# Patient Record
Sex: Male | Born: 1960 | Race: Black or African American | Hispanic: No | Marital: Married | Smoking: Current every day smoker
Health system: Southern US, Community
[De-identification: ages and names within clinical notes are randomized; demographics above are authoritative.]

## PROBLEM LIST (undated history)

## (undated) DIAGNOSIS — F250 Schizoaffective disorder, bipolar type: Secondary | ICD-10-CM

## (undated) DIAGNOSIS — I10 Essential (primary) hypertension: Secondary | ICD-10-CM

## (undated) DIAGNOSIS — H409 Unspecified glaucoma: Secondary | ICD-10-CM

## (undated) DIAGNOSIS — C61 Malignant neoplasm of prostate: Secondary | ICD-10-CM

## (undated) DIAGNOSIS — F17200 Nicotine dependence, unspecified, uncomplicated: Secondary | ICD-10-CM

## (undated) DIAGNOSIS — F419 Anxiety disorder, unspecified: Secondary | ICD-10-CM

## (undated) DIAGNOSIS — F259 Schizoaffective disorder, unspecified: Secondary | ICD-10-CM

## (undated) HISTORY — DX: Nicotine dependence, unspecified, uncomplicated: F17.200

## (undated) HISTORY — DX: Essential (primary) hypertension: I10

## (undated) HISTORY — DX: Schizoaffective disorder, bipolar type: F25.0

## (undated) HISTORY — DX: Unspecified glaucoma: H40.9

## (undated) HISTORY — PX: TONSILLECTOMY: SUR1361

## (undated) HISTORY — DX: Anxiety disorder, unspecified: F41.9

---

## 2002-10-01 ENCOUNTER — Inpatient Hospital Stay (HOSPITAL_COMMUNITY): Admission: EM | Admit: 2002-10-01 | Discharge: 2002-10-09 | Payer: Self-pay | Admitting: Psychiatry

## 2003-08-07 ENCOUNTER — Other Ambulatory Visit: Payer: Self-pay

## 2009-02-12 ENCOUNTER — Inpatient Hospital Stay: Payer: Self-pay | Admitting: Psychiatry

## 2009-11-07 ENCOUNTER — Emergency Department: Payer: Self-pay | Admitting: Emergency Medicine

## 2010-10-05 ENCOUNTER — Emergency Department: Payer: Self-pay | Admitting: Internal Medicine

## 2014-02-18 ENCOUNTER — Emergency Department: Payer: Self-pay | Admitting: Emergency Medicine

## 2014-02-18 LAB — COMPREHENSIVE METABOLIC PANEL
Albumin: 4.1 g/dL (ref 3.4–5.0)
Alkaline Phosphatase: 62 U/L
Anion Gap: 3 — ABNORMAL LOW (ref 7–16)
BUN: 25 mg/dL — ABNORMAL HIGH (ref 7–18)
Bilirubin,Total: 0.6 mg/dL (ref 0.2–1.0)
CALCIUM: 9 mg/dL (ref 8.5–10.1)
CREATININE: 1.28 mg/dL (ref 0.60–1.30)
Chloride: 106 mmol/L (ref 98–107)
Co2: 29 mmol/L (ref 21–32)
Glucose: 63 mg/dL — ABNORMAL LOW (ref 65–99)
Osmolality: 278 (ref 275–301)
POTASSIUM: 3.8 mmol/L (ref 3.5–5.1)
SGOT(AST): 32 U/L (ref 15–37)
SGPT (ALT): 27 U/L
Sodium: 138 mmol/L (ref 136–145)
Total Protein: 7.5 g/dL (ref 6.4–8.2)

## 2014-02-18 LAB — DRUG SCREEN, URINE
AMPHETAMINES, UR SCREEN: NEGATIVE (ref ?–1000)
Barbiturates, Ur Screen: NEGATIVE (ref ?–200)
Benzodiazepine, Ur Scrn: NEGATIVE (ref ?–200)
CANNABINOID 50 NG, UR ~~LOC~~: NEGATIVE (ref ?–50)
COCAINE METABOLITE, UR ~~LOC~~: NEGATIVE (ref ?–300)
MDMA (Ecstasy)Ur Screen: NEGATIVE (ref ?–500)
Methadone, Ur Screen: NEGATIVE (ref ?–300)
Opiate, Ur Screen: NEGATIVE (ref ?–300)
Phencyclidine (PCP) Ur S: NEGATIVE (ref ?–25)
TRICYCLIC, UR SCREEN: NEGATIVE (ref ?–1000)

## 2014-02-18 LAB — URINALYSIS, COMPLETE
BACTERIA: NONE SEEN
Bilirubin,UR: NEGATIVE
Glucose,UR: NEGATIVE mg/dL (ref 0–75)
Ketone: NEGATIVE
LEUKOCYTE ESTERASE: NEGATIVE
Nitrite: NEGATIVE
PH: 6 (ref 4.5–8.0)
Protein: NEGATIVE
Specific Gravity: 1.024 (ref 1.003–1.030)
WBC UR: <1 /HPF (ref 0–5)

## 2014-02-18 LAB — CBC
HCT: 43.7 % (ref 40.0–52.0)
HGB: 14.2 g/dL (ref 13.0–18.0)
MCH: 31 pg (ref 26.0–34.0)
MCHC: 32.6 g/dL (ref 32.0–36.0)
MCV: 95 fL (ref 80–100)
Platelet: 208 10*3/uL (ref 150–440)
RBC: 4.59 10*6/uL (ref 4.40–5.90)
RDW: 13.2 % (ref 11.5–14.5)
WBC: 8.3 10*3/uL (ref 3.8–10.6)

## 2014-02-18 LAB — ETHANOL: Ethanol: <3 mg/dL

## 2014-02-18 LAB — SALICYLATE LEVEL: Salicylates, Serum: 3.3 mg/dL — ABNORMAL HIGH

## 2014-02-18 LAB — ACETAMINOPHEN LEVEL

## 2014-09-28 NOTE — Consult Note (Signed)
PATIENT NAME:  Ian Cook, Ian Cook MR#:  [id number] DATE OF BIRTH:  30-Mar-1961  DATE OF CONSULTATION:  02/18/2014  REFERRING PHYSICIAN:   CONSULTING PHYSICIAN:  Gonzella Lex, MD  IDENTIFYING INFORMATION AND REASON FOR CONSULT: A 54 year old man with schizophrenia who came to the hospital voluntarily.   CHIEF COMPLAINT: "I just need my meds."   HISTORY OF PRESENT ILLNESS: Information obtained from the patient and the chart. The patient tells me that he has been out of his prescription antipsychotic for about a month. Based on the refills I have on file in my office he has probably been off it for more like several months. He is hesitant to give me a complete history as to what symptoms he is having with it. He does admit that he is having auditory hallucinations, that he has been feeling like his thoughts are racing, that he has been feeling anxious and more jittery. He is not sleeping as well as he used to and his appetite is worse. He denies suicidal or homicidal ideation. Denies that he has been aggressive. Denies that he is abusing any medicines. Apparently the reason he stopped coming for followup appointments that somehow his Medicare became invalid. I have no idea how this happened, but this clearly chronically disabled gentleman certainly needs to have some kind of coverage. He does not report any particular new stresses or changes in his environment or situation.   PAST PSYCHIATRIC HISTORY: Long history of schizophrenia. He was last in the hospital about 4-5 years ago. He does not have a clear history of suicide attempts. Decompensates with hallucinations and paranoia and agitation at times. He has had multiple medication trials in the past,  Zyprexa 30 mg at night seemed to be working as well or better than anything.   SOCIAL HISTORY: Lives with his nephew. That is his closest relative and has been a stable situation for quite a while.   PAST MEDICAL HISTORY: He looks pretty thin and he admits  he has been losing weight, but he does not have any physical complaints. Does not have any other known specific medical problems chronically.   SUBSTANCE ABUSE HISTORY: Denies that he is using alcohol or drugs. Does not have a known past history of substance abuse that I can identify.   REVIEW OF SYSTEMS: Admits to auditory hallucinations, but he says they do not bother him too much. No suicidal or homicidal ideation. No visual hallucinations. Appetite has not been as good. No pain.  The rest of his full physical review of systems is negative.   MENTAL STATUS EXAMINATION: A somewhat disheveled, underweight gentleman who looks his stated age, cooperative with the interview. Eye contact good. Psychomotor activity a little fidgety. Speech quiet, decreased in amount, some thought blocking. Thoughts are disorganized. Veers off topic easily. Did not make any obviously bizarre statements, did not make any threatening statements. Denies suicidal or homicidal ideation. Denies visual hallucinations. Positive auditory hallucinations. He is alert and oriented x 3. Could remember 3 out of 3 objects immediately and at 3 minutes.   LABORATORY RESULTS: Drug screen is all negative. Chemistry shows a low glucose at 63, BUN elevated at 25. The rest of the chemistry panel unremarkable. Alcohol level negative. His CBC is all normal. His urinalysis is also unremarkable.   VITAL SIGNS: His most recent blood pressure is 143/102, respirations 18, pulse 74, temperature 98.8.   ASSESSMENT: A 54 year old man with schizophrenia who presented to the hospital simply requesting a refill of his medicines.  Mild to moderate exacerbation of his psychotic symptoms. No sign of dangerousness. The patient does not require inpatient hospitalization. He needs to be continuing to get outpatient followup care. He was seeing me in my office and has not been there for about a year. Apparently because his Medicare somehow lapsed.   TREATMENT PLAN:  I have put in a prescription for Zyprexa 15 mg strength 2 p.o. at bedtime pills with a refill to the [facility]. Educated the patient about this. Told him if that is not a cheap enough pharmacy he can call around to other drug stores and see if any place else can get it for him less expensive. He or his nephew need to find a way to get his Medicare reinstated so they can come back and see me in the office, otherwise should go to [facility] or [place] for outpatient psychiatric followup.   DIAGNOSIS, PRINCIPAL AND PRIMARY:   AXIS I: Schizophrenia.   SECONDARY DIAGNOSES:  AXIS I: No further.   AXIS II: No diagnosis.     ____________________________ Gonzella Lex, MD jtc:bu D: 02/18/2014 18:34:00 ET T: 02/18/2014 18:48:34 ET JOB#: [id number]  cc: Gonzella Lex, MD, <Dictator> Gonzella Lex MD ELECTRONICALLY SIGNED 02/28/2014 0:09

## 2014-09-28 NOTE — Consult Note (Signed)
Brief Consult Note: Diagnosis: schizophrenia.   Patient was seen by consultant.   Consult note dictated.   Orders entered.   Discussed with Attending MD.   Comments: Psychiatry: Patient with schizophrenia presented to ER wanting a med refill. Refill done of zyprexa 30mg  qhs at [facility]. Patient can be discharged and urged to make follow up appointment.  Electronic Signatures: Clapacs, Madie Reno (MD)  (Signed 14-Sep-15 18:28)  Authored: Brief Consult Note   Last Updated: 14-Sep-15 18:28 by Gonzella Lex (MD)

## 2014-10-29 ENCOUNTER — Encounter: Payer: Self-pay | Admitting: Emergency Medicine

## 2014-10-29 ENCOUNTER — Emergency Department
Admission: EM | Admit: 2014-10-29 | Discharge: 2014-10-30 | Disposition: A | Discharge status: Home / Self Care | Payer: Medicare Other | Attending: Student | Admitting: Student

## 2014-10-29 DIAGNOSIS — F259 Schizoaffective disorder, unspecified: Secondary | ICD-10-CM | POA: Diagnosis not present

## 2014-10-29 DIAGNOSIS — I10 Essential (primary) hypertension: Secondary | ICD-10-CM | POA: Insufficient documentation

## 2014-10-29 DIAGNOSIS — Z79899 Other long term (current) drug therapy: Secondary | ICD-10-CM | POA: Diagnosis not present

## 2014-10-29 DIAGNOSIS — F309 Manic episode, unspecified: Secondary | ICD-10-CM | POA: Diagnosis present

## 2014-10-29 DIAGNOSIS — Z720 Tobacco use: Secondary | ICD-10-CM | POA: Diagnosis not present

## 2014-10-29 HISTORY — DX: Schizoaffective disorder, bipolar type: F25.0

## 2014-10-29 HISTORY — DX: Schizoaffective disorder, unspecified: F25.9

## 2014-10-29 HISTORY — DX: Essential (primary) hypertension: I10

## 2014-10-29 LAB — CBC WITH DIFFERENTIAL/PLATELET
Basophils Absolute: 0 10*3/uL (ref 0–0.1)
Basophils Relative: 1 %
EOS PCT: 2 %
Eosinophils Absolute: 0.1 10*3/uL (ref 0–0.7)
HEMATOCRIT: 41.2 % (ref 40.0–52.0)
Hemoglobin: 13.8 g/dL (ref 13.0–18.0)
LYMPHS ABS: 2 10*3/uL (ref 1.0–3.6)
LYMPHS PCT: 30 %
MCH: 31.6 pg (ref 26.0–34.0)
MCHC: 33.5 g/dL (ref 32.0–36.0)
MCV: 94.2 fL (ref 80.0–100.0)
MONO ABS: 0.5 10*3/uL (ref 0.2–1.0)
Monocytes Relative: 7 %
Neutro Abs: 4.2 10*3/uL (ref 1.4–6.5)
Neutrophils Relative %: 60 %
Platelets: 183 10*3/uL (ref 150–440)
RBC: 4.37 MIL/uL — ABNORMAL LOW (ref 4.40–5.90)
RDW: 13.4 % (ref 11.5–14.5)
WBC: 6.8 10*3/uL (ref 3.8–10.6)

## 2014-10-29 LAB — URINALYSIS COMPLETE WITH MICROSCOPIC (ARMC ONLY)
BACTERIA UA: NONE SEEN
BILIRUBIN URINE: NEGATIVE
Glucose, UA: NEGATIVE mg/dL
KETONES UR: NEGATIVE mg/dL
Leukocytes, UA: NEGATIVE
Nitrite: NEGATIVE
PH: 6 (ref 5.0–8.0)
PROTEIN: NEGATIVE mg/dL
SPECIFIC GRAVITY, URINE: 1.01 (ref 1.005–1.030)
Squamous Epithelial / LPF: NONE SEEN
WBC UA: NONE SEEN WBC/hpf (ref 0–5)

## 2014-10-29 LAB — COMPREHENSIVE METABOLIC PANEL
ALBUMIN: 4.5 g/dL (ref 3.5–5.0)
ALT: 18 U/L (ref 17–63)
AST: 29 U/L (ref 15–41)
Alkaline Phosphatase: 59 U/L (ref 38–126)
Anion gap: 6 (ref 5–15)
BILIRUBIN TOTAL: 0.5 mg/dL (ref 0.3–1.2)
BUN: 27 mg/dL — ABNORMAL HIGH (ref 6–20)
CALCIUM: 8.4 mg/dL — AB (ref 8.9–10.3)
CO2: 29 mmol/L (ref 22–32)
Chloride: 101 mmol/L (ref 101–111)
Creatinine, Ser: 1.42 mg/dL — ABNORMAL HIGH (ref 0.61–1.24)
GFR calc non Af Amer: 54 mL/min — ABNORMAL LOW (ref >60)
Glucose, Bld: 67 mg/dL (ref 65–99)
Potassium: 3.9 mmol/L (ref 3.5–5.1)
Sodium: 136 mmol/L (ref 135–145)
TOTAL PROTEIN: 7.1 g/dL (ref 6.5–8.1)

## 2014-10-29 LAB — URINE DRUG SCREEN, QUALITATIVE (ARMC ONLY)
Amphetamines, Ur Screen: NOT DETECTED
BARBITURATES, UR SCREEN: NOT DETECTED
BENZODIAZEPINE, UR SCRN: NOT DETECTED
Cannabinoid 50 Ng, Ur ~~LOC~~: NOT DETECTED
Cocaine Metabolite,Ur ~~LOC~~: NOT DETECTED
MDMA (ECSTASY) UR SCREEN: NOT DETECTED
Methadone Scn, Ur: NOT DETECTED
OPIATE, UR SCREEN: NOT DETECTED
Phencyclidine (PCP) Ur S: NOT DETECTED
Tricyclic, Ur Screen: NOT DETECTED

## 2014-10-29 LAB — ETHANOL: Alcohol, Ethyl (B): <5 mg/dL (ref <5)

## 2014-10-29 MED ORDER — LURASIDONE HCL 40 MG PO TABS
40.0000 mg | ORAL_TABLET | Freq: Every day | ORAL | Status: DC
  Administered 2014-10-30: 40 mg via ORAL
  Filled 2014-10-29: qty 1

## 2014-10-29 NOTE — ED Notes (Signed)
Brought in by group home personnel for beh med eval. ..may need possible medication adjustment

## 2014-10-29 NOTE — ED Notes (Signed)
Patient assigned to appropriate care area. Patient oriented to unit/care area: Informed that, for their safety, care areas are designed for safety and monitored by security cameras at all times; and visiting hours explained to patient. Patient verbalizes understanding, and verbal contract for safety obtained. 

## 2014-10-29 NOTE — ED Notes (Signed)
BEHAVIORAL HEALTH ROUNDING Patient sleeping: No. Patient alert and oriented: yes Behavior appropriate: Yes.  ; If no, describe: n/a Nutrition and fluids offered: Yes- pt given dinner tray. Toileting and hygiene offered: No Sitter present: no Event organiser present: Yes

## 2014-10-29 NOTE — ED Notes (Signed)
BEHAVIORAL HEALTH ROUNDING Patient sleeping: Yes.   Patient alert and oriented: not applicable Behavior appropriate: Yes.  ; If no, describe:  Nutrition and fluids offered: No Toileting and hygiene offered: No Sitter present: yes Law enforcement present: Yes   
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
## 2014-10-29 NOTE — ED Notes (Signed)
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
## 2014-10-29 NOTE — ED Notes (Signed)
RISHAAN GUNNER (POA) [phone] [facility].

## 2014-10-29 NOTE — ED Notes (Signed)

## 2014-10-29 NOTE — ED Notes (Signed)
Herewith group home rep, states pt is not supposed to leave the facility but keeps doing it.  Left this am and did not get meds.

## 2014-10-30 ENCOUNTER — Inpatient Hospital Stay
Admission: EM | Admit: 2014-10-30 | Discharge: 2014-11-06 | DRG: 885 | Disposition: A | Discharge status: Home / Self Care | Payer: Medicare Other | Source: Intra-hospital | Attending: Psychiatry | Admitting: Psychiatry

## 2014-10-30 DIAGNOSIS — F209 Schizophrenia, unspecified: Secondary | ICD-10-CM | POA: Diagnosis present

## 2014-10-30 DIAGNOSIS — F250 Schizoaffective disorder, bipolar type: Secondary | ICD-10-CM | POA: Diagnosis present

## 2014-10-30 DIAGNOSIS — Z79899 Other long term (current) drug therapy: Secondary | ICD-10-CM | POA: Diagnosis not present

## 2014-10-30 DIAGNOSIS — F28 Other psychotic disorder not due to a substance or known physiological condition: Secondary | ICD-10-CM | POA: Diagnosis present

## 2014-10-30 DIAGNOSIS — Z8249 Family history of ischemic heart disease and other diseases of the circulatory system: Secondary | ICD-10-CM

## 2014-10-30 DIAGNOSIS — N179 Acute kidney failure, unspecified: Secondary | ICD-10-CM | POA: Diagnosis present

## 2014-10-30 DIAGNOSIS — F17213 Nicotine dependence, cigarettes, with withdrawal: Secondary | ICD-10-CM | POA: Diagnosis present

## 2014-10-30 DIAGNOSIS — I10 Essential (primary) hypertension: Secondary | ICD-10-CM | POA: Diagnosis present

## 2014-10-30 DIAGNOSIS — F259 Schizoaffective disorder, unspecified: Secondary | ICD-10-CM | POA: Diagnosis not present

## 2014-10-30 DIAGNOSIS — G4700 Insomnia, unspecified: Secondary | ICD-10-CM | POA: Diagnosis present

## 2014-10-30 DIAGNOSIS — F17200 Nicotine dependence, unspecified, uncomplicated: Secondary | ICD-10-CM | POA: Diagnosis present

## 2014-10-30 HISTORY — DX: Schizoaffective disorder, bipolar type: F25.0

## 2014-10-30 MED ORDER — ALUM & MAG HYDROXIDE-SIMETH 200-200-20 MG/5ML PO SUSP: 30.0000 mL | ORAL | Status: DC | PRN

## 2014-10-30 MED ORDER — MAGNESIUM HYDROXIDE 400 MG/5ML PO SUSP: 30.0000 mL | Freq: Every day | ORAL | Status: DC | PRN

## 2014-10-30 MED ORDER — NICOTINE 10 MG IN INHA
RESPIRATORY_TRACT | Status: AC
  Administered 2014-10-30: 1 via RESPIRATORY_TRACT
  Filled 2014-10-30: qty 36

## 2014-10-30 MED ORDER — NICOTINE 10 MG IN INHA
1.0000 | RESPIRATORY_TRACT | Status: DC | PRN
  Administered 2014-10-30: 1 via RESPIRATORY_TRACT

## 2014-10-30 MED ORDER — NICOTINE 10 MG IN INHA: 1.0000 | RESPIRATORY_TRACT | Status: DC | PRN

## 2014-10-30 MED ORDER — ACETAMINOPHEN 325 MG PO TABS: 650.0000 mg | ORAL_TABLET | Freq: Four times a day (QID) | ORAL | Status: DC | PRN

## 2014-10-30 MED ORDER — LURASIDONE HCL 40 MG PO TABS: 40.0000 mg | ORAL_TABLET | Freq: Every day | ORAL | Status: DC

## 2014-10-30 MED ORDER — TRAZODONE HCL 50 MG PO TABS
50.0000 mg | ORAL_TABLET | Freq: Every day | ORAL | Status: DC
  Administered 2014-10-31: 50 mg via ORAL
  Filled 2014-10-30: qty 1
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
## 2014-10-30 NOTE — ED Notes (Signed)
BEHAVIORAL HEALTH ROUNDING Patient sleeping: Yes.   Patient alert and oriented: not applicable Behavior appropriate: No.; If no, describe: sleeping}sleeping Nutrition and fluids offered: No Toileting and hygiene offered: No Sitter present: yes Law enforcement present: Yes

## 2014-10-30 NOTE — BHH Counselor (Signed)
Pt. is to be admitted to [facility] by Dr. Weber Cooks. Attending Physician will be Dr. Jerilee Hoh.  Pt. has been assigned to room 322A, by [facility] Charge Nurse Judie Grieve.  Intake Paper Work has been signed and placed on pt. chart. ER staff Holley Raring, ER Carlyon Prows, RN & Dr. Maudie Flakes MD.) have been made aware of the admission.

## 2014-10-30 NOTE — ED Notes (Signed)
BEHAVIORAL HEALTH ROUNDING Patient sleeping: No. Patient alert and oriented: yes Behavior appropriate: Yes.  ; If no, describe:  Nutrition and fluids offered: Yes  Toileting and hygiene offered: Yes  Sitter present: no Law enforcement present: Yes [facility] 
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
## 2014-10-30 NOTE — ED Notes (Signed)

## 2014-10-30 NOTE — BHH Group Notes (Signed)
[facility] Group Notes:  (Nursing/MHT/Case Management/Adjunct)  Date:  10/30/2014  Time:  11:00 PM  Type of Therapy:  Group Therapy  Participation Level:  Active  Participation Quality:  Appropriate  Affect:  Appropriate  Cognitive:  Appropriate  Insight:  Appropriate  Engagement in Group:  Engaged  Modes of Intervention:  Support  Summary of Progress/Problems:  Ian Cook 10/30/2014, 11:00 PM

## 2014-10-30 NOTE — ED Notes (Signed)
BEHAVIORAL HEALTH ROUNDING Patient sleeping: Yes.   Patient alert and oriented: not applicable Behavior appropriate: Yes.  ; If no, describe:  Nutrition and fluids offered: No Toileting and hygiene offered: No Sitter present: yes Law enforcement present: Yes   

## 2014-10-30 NOTE — ED Notes (Signed)
BEHAVIORAL HEALTH ROUNDING Patient sleeping: Yes.   Patient alert and oriented: not applicable Behavior appropriate: Yes.    Nutrition and fluids offered: No Toileting and hygiene offered: No Sitter present: q15 minute observations and security camera monitoring Law enforcement present: Yes [employer] 

## 2014-10-30 NOTE — Tx Team (Signed)
Initial Interdisciplinary Treatment Plan   PATIENT STRESSORS: Mental Illness   PATIENT STRENGTHS: Supportive family/friends   PROBLEM LIST: Problem List/Patient Goals Date to be addressed Date deferred Reason deferred Estimated date of resolution  Psychosis 10/30/14     Anxiety 10/30/14                                                DISCHARGE CRITERIA:  Improved stabilization in mood, thinking, and/or behavior  PRELIMINARY DISCHARGE PLAN: Outpatient therapy  PATIENT/FAMIILY INVOLVEMENT: This treatment plan has been presented to and reviewed with the patient, Ian Cook, and/or family member.  The patient and family have been given the opportunity to ask questions and make suggestions.  Nash Mantis [facility] 10/30/2014, 10:29 PM

## 2014-10-30 NOTE — Progress Notes (Addendum)
Patient presents IVC today after increased confusion and wandering away from his group home.  Patient is unaware of these complaints stating "Nothing is wrong.  I don't know why I'm here."  Patient calm and cooperative on admission.  No distress noted.  Patient denies any medical issues.  Patient is a poor historian.  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 Patient search performed.  No contraband found.

## 2014-10-30 NOTE — Consult Note (Signed)
[facility] Consult   Reason for Consult:  Consult for this patient with schizophrenia brought in by his family and caretaker because of increasingly disorganized or agitated behavior Referring Physician:  lord Patient Identification: Ian Cook MRN:  [id number] Principal Diagnosis: Schizophrenia Diagnosis:   Patient Active Problem List   Diagnosis Date Noted  . Schizophrenia [F20.9] 10/30/2014    Total Time spent with patient: 45 minutes  Subjective:   Ian Cook is a 54 y.o. male patient admitted with "I just needed a little break".  HPI:  Information from the patient and the chart. Patient's paperwork from his nephew who is also a caretaker for him reports that he's been wandering away from the group home much more than usual recently which she normally doesn't do. He's been more confused in his thinking seems to be a little more agitated. They think that his medication is not working well enough. The patient himself admits that he's been wandering away from the group home. 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 He says his mood is been very good. Denies any medical problems and says he takes his medicine regularly. Says his mood is been very upbeat recently. Sounds like he's been trying to take on new activities. Denies any hallucinations. Denies getting into any fights. No specific stress. Not clear where he is getting his current psychiatric treatment.  Past psychiatric history: Patient with a long history of schizophrenia. He used to see me in the outpatient office but has not come for treatment there in a few years. In the past Zyprexa was helpful for him. It looks like currently he is taking little to do. It says 40 mg I'm not sure if that is the correct in total dose. No known history of suicide attempts. Has had episodes of agitation and hyperactivity but not violence. It's been a couple years since he was in  a psychiatric hospital.  Social history: Patient's nephew is his closest relative. His nephew is the proprietor of local group homes and looks after his uncle. Patient obviously is unable to work. He says that he feels like he doesn't get enough money and so he is going out trying to earn some more recently which is unusual for him.  Medical history: Patient always looks a little bit thin. At times he will have slightly high blood pressures but he has no clear ongoing medical history.  Substance abuse history: Denies that he's been drinking or using drugs recently. Hasn't been a major problems far as I know in the past.  Current medications latuda 28m daily HPI Elements:   Quality:  Agitation euphoric mood. Severity:  Mild to moderate. Timing:  Worse over the last few weeks. Duration:  Intermittent decompensation of a chronic illness. Context:  Different medicine than he used to take unknown what other stresses could be involved.  Past Medical History:  Past Medical History  Diagnosis Date  . Hypertension   . Schizo-affective schizophrenia    History reviewed. No pertinent past surgical history. Family History: History reviewed. No pertinent family history. Social History:  History  Alcohol Use No     History  Drug Use Not on file    History   Social History  . Marital Status: Married    Spouse Name: N/A  . Number of Children: N/A  . Years of Education: N/A   Social History Main Topics  . Smoking status: Current Every  Day Smoker  . Smokeless tobacco: Not on file  . Alcohol Use: No  . Drug Use: Not on file  . Sexual Activity: Not on file   Other Topics Concern  . None   Social History Narrative  . None   Additional Social History:    History of alcohol / drug use?: No history of alcohol / drug abuse                     Allergies:  Allergies no known allergies  Labs:  Results for orders placed or performed during the hospital encounter of 10/29/14  (from the past 48 hour(s))  CBC WITH DIFFERENTIAL     Status: Abnormal   Collection Time: 10/29/14  4:48 PM  Result Value Ref Range   WBC 6.8 3.8 - 10.6 K/uL   RBC 4.37 (L) 4.40 - 5.90 MIL/uL   Hemoglobin 13.8 13.0 - 18.0 g/dL   HCT 41.2 40.0 - 52.0 %   MCV 94.2 80.0 - 100.0 fL   MCH 31.6 26.0 - 34.0 pg   MCHC 33.5 32.0 - 36.0 g/dL   RDW 13.4 11.5 - 14.5 %   Platelets 183 150 - 440 K/uL   Neutrophils Relative % 60 %   Neutro Abs 4.2 1.4 - 6.5 K/uL   Lymphocytes Relative 30 %   Lymphs Abs 2.0 1.0 - 3.6 K/uL   Monocytes Relative 7 %   Monocytes Absolute 0.5 0.2 - 1.0 K/uL   Eosinophils Relative 2 %   Eosinophils Absolute 0.1 0 - 0.7 K/uL   Basophils Relative 1 %   Basophils Absolute 0.0 0 - 0.1 K/uL  Comprehensive metabolic panel     Status: Abnormal   Collection Time: 10/29/14  4:48 PM  Result Value Ref Range   Sodium 136 135 - 145 mmol/L   Potassium 3.9 3.5 - 5.1 mmol/L   Chloride 101 101 - 111 mmol/L   CO2 29 22 - 32 mmol/L   Glucose, Bld 67 65 - 99 mg/dL   BUN 27 (H) 6 - 20 mg/dL   Creatinine, Ser 1.42 (H) 0.61 - 1.24 mg/dL   Calcium 8.4 (L) 8.9 - 10.3 mg/dL   Total Protein 7.1 6.5 - 8.1 g/dL   Albumin 4.5 3.5 - 5.0 g/dL   AST 29 15 - 41 U/L   ALT 18 17 - 63 U/L   Alkaline Phosphatase 59 38 - 126 U/L   Total Bilirubin 0.5 0.3 - 1.2 mg/dL   GFR calc non Af Amer 54 (L) >60 mL/min   GFR calc Af Amer >60 >60 mL/min    Comment: (NOTE) The eGFR has been calculated using the CKD EPI equation. This calculation has not been validated in all clinical situations. eGFR's persistently <60 mL/min signify possible Chronic Kidney Disease.    Anion gap 6 5 - 15  Ethanol     Status: None   Collection Time: 10/29/14  4:48 PM  Result Value Ref Range   Alcohol, Ethyl (B) <5 <5 mg/dL    Comment:        LOWEST DETECTABLE LIMIT FOR SERUM ALCOHOL IS 11 mg/dL FOR MEDICAL PURPOSES ONLY   Urinalysis complete, with microscopic [facility])     Status: Abnormal   Collection Time: 10/29/14   4:48 PM  Result Value Ref Range   Color, Urine STRAW (A) YELLOW   APPearance CLEAR (A) CLEAR   Glucose, UA NEGATIVE NEGATIVE mg/dL   Bilirubin Urine NEGATIVE NEGATIVE   Ketones, ur  NEGATIVE NEGATIVE mg/dL   Specific Gravity, Urine 1.010 1.005 - 1.030   Hgb urine dipstick 2+ (A) NEGATIVE   pH 6.0 5.0 - 8.0   Protein, ur NEGATIVE NEGATIVE mg/dL   Nitrite NEGATIVE NEGATIVE   Leukocytes, UA NEGATIVE NEGATIVE   RBC / HPF 0-5 0 - 5 RBC/hpf   WBC, UA NONE SEEN 0 - 5 WBC/hpf   Bacteria, UA NONE SEEN NONE SEEN   Squamous Epithelial / LPF NONE SEEN NONE SEEN  Urine Drug Screen, Qualitative [facility])     Status: None   Collection Time: 10/29/14  4:48 PM  Result Value Ref Range   Tricyclic, Ur Screen NONE DETECTED NONE DETECTED   Amphetamines, Ur Screen NONE DETECTED NONE DETECTED   MDMA (Ecstasy)Ur Screen NONE DETECTED NONE DETECTED   Cocaine Metabolite,Ur [place] NONE DETECTED NONE DETECTED   Opiate, Ur Screen NONE DETECTED NONE DETECTED   Phencyclidine (PCP) Ur S NONE DETECTED NONE DETECTED   Cannabinoid 50 Ng, Ur [place] NONE DETECTED NONE DETECTED   Barbiturates, Ur Screen NONE DETECTED NONE DETECTED   Benzodiazepine, Ur Scrn NONE DETECTED NONE DETECTED   Methadone Scn, Ur NONE DETECTED NONE DETECTED    Comment: (NOTE) 846  Tricyclics, urine               Cutoff 1000 ng/mL 200  Amphetamines, urine             Cutoff 1000 ng/mL 300  MDMA (Ecstasy), urine           Cutoff 500 ng/mL 400  Cocaine Metabolite, urine       Cutoff 300 ng/mL 500  Opiate, urine                   Cutoff 300 ng/mL 600  Phencyclidine (PCP), urine      Cutoff 25 ng/mL 700  Cannabinoid, urine              Cutoff 50 ng/mL 800  Barbiturates, urine             Cutoff 200 ng/mL 900  Benzodiazepine, urine           Cutoff 200 ng/mL 1000 Methadone, urine                Cutoff 300 ng/mL 1100 1200 The urine drug screen provides only a preliminary, unconfirmed 1300 analytical test result and should not be used for non-medical 1400  purposes. Clinical consideration and professional judgment should 1500 be applied to any positive drug screen result due to possible 1600 interfering substances. A more specific alternate chemical method 1700 must be used in order to obtain a confirmed analytical result.  1800 Gas chromato graphy / mass spectrometry (GC/MS) is the preferred 1900 confirmatory method.     Vitals: Blood pressure 116/52, pulse 66, temperature 98 F (36.7 C), temperature source Oral, resp. rate 18, height 6' 2"  (1.88 m), weight 81.647 kg (180 lb), SpO2 97 %.  Risk to Self: Suicidal Ideation: No Suicidal Intent: No Is patient at risk for suicide?: No Suicidal Plan?: No Intentional Self Injurious Behavior: None Risk to Others: Homicidal Ideation: No Thoughts of Harm to Others: No Current Homicidal Plan: No Access to Homicidal Means: No History of harm to others?: No Does patient have access to weapons?: No Does patient have a court date: No Prior Inpatient Therapy:   Prior Outpatient Therapy:    Current Facility-Administered Medications  Medication Dose Route Frequency Provider Last Rate Last Dose  . lurasidone (LATUDA)  tablet 40 mg  40 mg Oral Q breakfast Joanne Gavel, MD   40 mg at 10/30/14 0938  . nicotine (NICOTROL) 10 MG inhaler 1 continuous puffing  1 continuous puffing Inhalation PRN Lisa Roca, MD   1 continuous puffing at 10/30/14 1225   Current Outpatient Prescriptions  Medication Sig Dispense Refill  . lurasidone (LATUDA) 40 MG TABS tablet Take 40 mg by mouth daily with breakfast.      Musculoskeletal: Strength & Muscle Tone: within normal limits Gait & Station: normal Patient leans: N/A  Psychiatric Specialty Exam: Physical Exam  Constitutional: He appears well-developed and well-nourished.  HENT:  Head: Normocephalic and atraumatic.  Eyes: Conjunctivae are normal. Pupils are equal, round, and reactive to light.  Neck: Normal range of motion.  Cardiovascular: Normal heart  sounds.   Respiratory: Effort normal.  GI: Soft.  Musculoskeletal: Normal range of motion.  Neurological: He is alert.  Skin: Skin is warm and dry.  Psychiatric: Thought content normal. His affect is labile. His speech is delayed. He is withdrawn. Cognition and memory are impaired. He expresses impulsivity. He exhibits abnormal recent memory.    Review of Systems  Constitutional: Negative.   HENT: Negative.   Eyes: Negative.   Respiratory: Negative.   Cardiovascular: Negative.   Gastrointestinal: Negative.   Musculoskeletal: Negative.   Skin: Negative.   Neurological: Negative.   Psychiatric/Behavioral: Negative for depression, suicidal ideas, hallucinations, memory loss and substance abuse. The patient is not nervous/anxious and does not have insomnia.     Blood pressure 116/52, pulse 66, temperature 98 F (36.7 C), temperature source Oral, resp. rate 18, height 6' 2"  (1.88 m), weight 81.647 kg (180 lb), SpO2 97 %.Body mass index is 23.1 kg/(m^2).  General Appearance: Fairly Groomed  Engineer, water::  Good  Speech:  Slow and Slurred  Volume:  Increased  Mood:  Euphoric  Affect:  Labile  Thought Process:  Disorganized  Orientation:  Full (Time, Place, and Person)  Thought Content:  Ideas of Reference:   Paranoia and Paranoid Ideation  Suicidal Thoughts:  No  Homicidal Thoughts:  No  Memory:  Immediate;   Poor Recent;   Fair Remote;   Poor  Judgement:  Impaired  Insight:  Shallow  Psychomotor Activity:  Normal  Concentration:  Poor  Recall:  [employer] of Knowledge:Fair  Language: Fair  Akathisia:  No  Handed:  Right  AIMS (if indicated):     Assets:  Desire for Improvement Financial Resources/Insurance Housing Physical Health  ADL's:  Intact  Cognition: Impaired,  Mild  Sleep:      Medical Decision Making: Review of Psycho-Social Stressors (1), Review or order clinical lab tests (1), Established Problem, Worsening (2), Review of Medication Regimen & Side Effects (2)  and Review of New Medication or Change in Dosage (2)  Treatment Plan Summary: Medication management and Plan Patient does appear to be hypomanic to manic. During our conversation what of the things that really struck me was that he was giggling constantly which is not particularly normal for him. He seems to be very distracted and his speech is not making a lot of sense. Insight is not very good. I suspect that he probably is decompensating into a manic state and would benefit from at least a brief hospitalization for stabilization. I will make sure what he is restarted on his medicine. Labs of been reviewed nothing remarkable that needs immediate treatment. If we have a bed available I will plan for admission to psychiatry.  Plan:  Recommend psychiatric Inpatient admission when medically cleared. Supportive therapy provided about ongoing stressors. Disposition: Continue IVC likely admission to psychiatry  Alethia Berthold 10/30/2014 2:36 PM

## 2014-10-30 NOTE — ED Notes (Signed)
Report called to Abigail Butts RN in behavioral unit

## 2014-10-30 NOTE — ED Notes (Signed)
ED BHU [place] Is the patient under IVC or is there intent for IVC: Yes.   Is the patient medically cleared: Yes.   Is there vacancy in the ED BHU: Yes.   Is the population mix appropriate for patient: Yes.   Is the patient awaiting placement in inpatient or outpatient setting: No. Has the patient had a psychiatric consult: No. Survey of unit performed for contraband, proper placement and condition of furniture, tampering with fixtures in bathroom, shower, and each patient room: Yes.  ; Findings: all clear APPEARANCE/BEHAVIOR calm NEURO ASSESSMENT Orientation: time, place and person Hallucinations: Yes.  None noted (Hallucinations) Speech: Normal Gait: normal RESPIRATORY ASSESSMENT WNL CARDIOVASCULAR ASSESSMENT WNL GASTROINTESTINAL ASSESSMENT WNL EXTREMITIES ROM of all joints is normal PLAN OF CARE Provide calm/safe environment. Vital signs assessed twice daily. ED BHU Assessment once each 12-hour shift. Collaborate with intake RN daily or as condition indicates. Assure the ED provider has rounded once each shift. Provide and encourage hygiene. Provide redirection as needed. Assess for escalating behavior; address immediately and inform ED provider.  Assess family dynamic and appropriateness for visitation as needed: Yes.  ; If necessary, describe findings:  Educate the patient/family about BHU procedures/visitation: Yes.  ; If necessary, describe findings:

## 2014-10-30 NOTE — ED Provider Notes (Signed)
Morning vitals were reviewed and unremarkable. Per nursing report or no acute incidents overnight. Patient was evaluated by Dr. Weber Cooks who gave me updates that he will likely admit the patient today, awaiting Dr. Weber Cooks consultation note.  Lisa Roca, MD 10/30/14 [phone]

## 2014-10-31 MED ORDER — OLANZAPINE 5 MG PO TABS
15.0000 mg | ORAL_TABLET | Freq: Every day | ORAL | Status: DC
  Administered 2014-11-01 – 2014-11-05 (×5): 15 mg via ORAL
  Filled 2014-10-31 (×2): qty 1
  Filled 2014-10-31: qty 2
  Filled 2014-10-31 (×3): qty 1

## 2014-10-31 MED ORDER — LISINOPRIL 10 MG PO TABS
10.0000 mg | ORAL_TABLET | Freq: Every day | ORAL | Status: DC
  Administered 2014-10-31 – 2014-11-05 (×6): 10 mg via ORAL
  Filled 2014-10-31 (×6): qty 1

## 2014-10-31 MED ORDER — TRAZODONE HCL 100 MG PO TABS
100.0000 mg | ORAL_TABLET | Freq: Every day | ORAL | Status: DC
  Administered 2014-10-31 – 2014-11-05 (×6): 100 mg via ORAL
  Filled 2014-10-31 (×7): qty 1

## 2014-10-31 MED ORDER — OLANZAPINE 10 MG PO TABS
10.0000 mg | ORAL_TABLET | Freq: Two times a day (BID) | ORAL | Status: DC
  Administered 2014-10-31: 10 mg via ORAL
  Filled 2014-10-31: qty 1

## 2014-10-31 MED ORDER — IBUPROFEN 600 MG PO TABS: 600.0000 mg | ORAL_TABLET | Freq: Four times a day (QID) | ORAL | Status: DC | PRN

## 2014-10-31 MED ORDER — NICOTINE 21 MG/24HR TD PT24
21.0000 mg | MEDICATED_PATCH | Freq: Every day | TRANSDERMAL | Status: DC
  Filled 2014-10-31: qty 1

## 2014-10-31 MED ORDER — HYDRALAZINE HCL 25 MG PO TABS
25.0000 mg | ORAL_TABLET | Freq: Three times a day (TID) | ORAL | Status: DC | PRN
  Administered 2014-11-05 – 2014-11-06 (×3): 25 mg via ORAL
  Filled 2014-10-31 (×3): qty 1

## 2014-10-31 NOTE — [facility] Group Notes (Signed)
[facility] LCSW Group Therapy  10/31/2014 12:53 PM  Type of Therapy:  Group Therapy  Participation Level:  None  Participation Quality:  was called out of group by staff before being able to introduce himself and did not return.  Affect:  n/a  Cognitive:  n/a  Insight:  n/a  Engagement in Therapy:  n/a  Modes of Intervention:  n/a  Summary of Progress/Problems: Patient attended group and was called out by staff after 5 minutes of group and not able to participate in introductions and did not return.  Carmell Austria T 10/31/2014, 12:53 PM

## 2014-10-31 NOTE — Progress Notes (Signed)
Pleasant and cooperative.  Smiles easily when approached.  Stays to self.  Unable to verbalized why he is here. No inappropriate behavior noted.

## 2014-10-31 NOTE — [facility] Group Notes (Signed)
[facility] Group Notes:  (Nursing/MHT/Case Management/Adjunct)  Date:  10/31/2014  Time:  3:36 PM  Type of Therapy:  Group Therapy  Participation Level:  Did Not Attend  Celso Amy 10/31/2014, 3:36 PM

## 2014-10-31 NOTE — H&P (Signed)
Psychiatric Admission Assessment Adult  Patient Identification: Ian Cook MRN:  [id number] Date of Evaluation:  10/31/2014 Chief Complaint:  schizophrenia Principal Diagnosis: <principal problem not specified> Diagnosis:   Patient Active Problem List   Diagnosis Date Noted  . Schizophrenia [F20.9] 10/30/2014  . Schizoaffective disorder, manic type [F25.0] 10/30/2014  . Schizoaffective disorder, unspecified type [F25.9]    History of Present Illness: Ian Cook is a 54 y.o. male with history of schizoaffective disorder and hypertension presented to our emergency department on May 24 for evaluation of manic behavior, hallucinations. Patient lives in a Group home who is owned by his nephew.  His nephew is also the patient's healthcare power of attorney. [name] reported that the patient's symptoms have worsened. He is now walking up to 20 miles away from his group home though he is not supposed to leave the group home. As he has been living in the facility sometimes he misses his morning medication. He is occasionally aggressive to other group home members as well.  His nephew felt that the patient needs a medication adjustment.   The patient himself admits that he's been wandering away from the group home. 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 Denies getting into any fights. No specific stress. He says his mood is been very good. Denies having any major issues with appetite, energy or concentration. He stated that sometimes at the group home he doesn't sleep very well. He denies having suicidality or homicidality. As far as auditory hallucinations he reports hearing voices but he couldn't tell me what the voices are saying but he denies that the voices tell him to hurt himself or others.  Patient states he has been compliant with all his medications. Denies major side effects or another that sometimes he feels  that his heart flutters.  He does not know the name of his medications or her might be prescribing them.  He denies the use of alcohol or illicit substances. He stated that he smokes about 10 cigarettes per day.  Elements:  Severity:  Severe. Timing:  Chronic with acute exacerbation. Duration:  Unclear. Context:  Poor compliance. Associated Signs/Symptoms: Depression Symptoms:  none (Hypo) Manic Symptoms:  Distractibility, Elevated Mood, Hallucinations, Impulsivity, Labiality of Mood, Anxiety Symptoms:  none Psychotic Symptoms:  Hallucinations: Auditory PTSD Symptoms: NA Total Time spent with patient: 1 hour   Past psychiatric history: Patient with a long history of schizophrenia. He used to see Dr. Kathreen Cosier at [facility]. In the past Zyprexa was helpful for him. As far as prior history of suicidal attempts, he reported overdosing in 1983 on his sleeping medications.  Looks like his only current medication is Latuda 40 mg daily  Past Medical History:  Past Medical History  Diagnosis Date  . Hypertension   . Schizo-affective schizophrenia    History reviewed. No pertinent past surgical history.   Family History: History reviewed. No pertinent family history. patient states that his mother had some mental health problems after losing child at the age of 70. Patient had another older sister who died 86 years ago.  Social History: Patient's nephew is his closest relative. His nephew is the proprietor of local group homes and looks after his uncle. Patient obviously is unable to work. He says that he feels like he doesn't get enough money and so he is going out trying to earn some more recently which is unusual for him.  History  Alcohol Use No     History  Drug Use No    History   Social History  . Marital Status: Married    Spouse Name: N/A  . Number of Children: N/A  . Years of Education: N/A   Social History Main Topics  . Smoking status: Current Every Day Smoker     Types: Cigarettes  . Smokeless tobacco: Not on file  . Alcohol Use: No  . Drug Use: No  . Sexual Activity: Not on file   Other Topics Concern  . None   Social History Narrative    Musculoskeletal: Strength & Muscle Tone: within normal limits Gait & Station: normal Patient leans: N/A  Psychiatric Specialty Exam: Physical Exam  ROS  Blood pressure 150/85, pulse 60, temperature 98.6 F (37 C), temperature source Oral, resp. rate 20, height '6\' 3"'  (1.905 m), weight 71.215 kg (157 lb).Body mass index is 19.62 kg/(m^2).  General Appearance: Fairly Groomed  Engineer, water::  Good  Speech:  Pressured  Volume:  Increased  Mood:  Euphoric  Affect:  Labile  Thought Process:  Tangential  Orientation:  Full (Time, Place, and Person)  Thought Content:  Hallucinations: Auditory  Suicidal Thoughts:  No  Homicidal Thoughts:  No  Memory:  Immediate;   Fair Recent;   Fair Remote;   Fair  Judgement:  Impaired  Insight:  Shallow  Psychomotor Activity:  Increased  Concentration:  Fair  Recall:  NA  Fund of Knowledge:Poor  Language: Fair  Akathisia:    Handed:    AIMS (if indicated):     Assets:  Financial Resources/Insurance Housing Social Support  ADL's:  Intact  Cognition: WNL  Sleep:  Number of Hours: 5.75    Alcohol Screening: 1. How often do you have a drink containing alcohol?: Never 9. Have you or someone else been injured as a result of your drinking?: No 10. Has a relative or friend or a doctor or another health worker been concerned about your drinking or suggested you cut down?: No Alcohol Use Disorder Identification Test Final Score (AUDIT): 0 Brief Intervention: AUDIT score less than 7 or less-screening does not suggest unhealthy drinking-brief intervention not indicated  Allergies:  No Known Allergies   Lab Results:  Results for orders placed or performed during the hospital encounter of 10/29/14 (from the past 48 hour(s))  CBC WITH DIFFERENTIAL     Status:  Abnormal   Collection Time: 10/29/14  4:48 PM  Result Value Ref Range   WBC 6.8 3.8 - 10.6 K/uL   RBC 4.37 (L) 4.40 - 5.90 MIL/uL   Hemoglobin 13.8 13.0 - 18.0 g/dL   HCT 41.2 40.0 - 52.0 %   MCV 94.2 80.0 - 100.0 fL   MCH 31.6 26.0 - 34.0 pg   MCHC 33.5 32.0 - 36.0 g/dL   RDW 13.4 11.5 - 14.5 %   Platelets 183 150 - 440 K/uL   Neutrophils Relative % 60 %   Neutro Abs 4.2 1.4 - 6.5 K/uL   Lymphocytes Relative 30 %   Lymphs Abs 2.0 1.0 - 3.6 K/uL   Monocytes Relative 7 %   Monocytes Absolute 0.5 0.2 - 1.0 K/uL   Eosinophils Relative 2 %   Eosinophils Absolute 0.1 0 - 0.7 K/uL   Basophils Relative 1 %   Basophils Absolute 0.0 0 - 0.1 K/uL  Comprehensive metabolic panel     Status: Abnormal   Collection Time: 10/29/14  4:48 PM  Result Value Ref Range  Sodium 136 135 - 145 mmol/L   Potassium 3.9 3.5 - 5.1 mmol/L   Chloride 101 101 - 111 mmol/L   CO2 29 22 - 32 mmol/L   Glucose, Bld 67 65 - 99 mg/dL   BUN 27 (H) 6 - 20 mg/dL   Creatinine, Ser 1.42 (H) 0.61 - 1.24 mg/dL   Calcium 8.4 (L) 8.9 - 10.3 mg/dL   Total Protein 7.1 6.5 - 8.1 g/dL   Albumin 4.5 3.5 - 5.0 g/dL   AST 29 15 - 41 U/L   ALT 18 17 - 63 U/L   Alkaline Phosphatase 59 38 - 126 U/L   Total Bilirubin 0.5 0.3 - 1.2 mg/dL   GFR calc non Af Amer 54 (L) >60 mL/min   GFR calc Af Amer >60 >60 mL/min    Comment: (NOTE) The eGFR has been calculated using the CKD EPI equation. This calculation has not been validated in all clinical situations. eGFR's persistently <60 mL/min signify possible Chronic Kidney Disease.    Anion gap 6 5 - 15  Ethanol     Status: None   Collection Time: 10/29/14  4:48 PM  Result Value Ref Range   Alcohol, Ethyl (B) <5 <5 mg/dL    Comment:        LOWEST DETECTABLE LIMIT FOR SERUM ALCOHOL IS 11 mg/dL FOR MEDICAL PURPOSES ONLY   Urinalysis complete, with microscopic [facility])     Status: Abnormal   Collection Time: 10/29/14  4:48 PM  Result Value Ref Range   Color, Urine STRAW (A)  YELLOW   APPearance CLEAR (A) CLEAR   Glucose, UA NEGATIVE NEGATIVE mg/dL   Bilirubin Urine NEGATIVE NEGATIVE   Ketones, ur NEGATIVE NEGATIVE mg/dL   Specific Gravity, Urine 1.010 1.005 - 1.030   Hgb urine dipstick 2+ (A) NEGATIVE   pH 6.0 5.0 - 8.0   Protein, ur NEGATIVE NEGATIVE mg/dL   Nitrite NEGATIVE NEGATIVE   Leukocytes, UA NEGATIVE NEGATIVE   RBC / HPF 0-5 0 - 5 RBC/hpf   WBC, UA NONE SEEN 0 - 5 WBC/hpf   Bacteria, UA NONE SEEN NONE SEEN   Squamous Epithelial / LPF NONE SEEN NONE SEEN  Urine Drug Screen, Qualitative [facility])     Status: None   Collection Time: 10/29/14  4:48 PM  Result Value Ref Range   Tricyclic, Ur Screen NONE DETECTED NONE DETECTED   Amphetamines, Ur Screen NONE DETECTED NONE DETECTED   MDMA (Ecstasy)Ur Screen NONE DETECTED NONE DETECTED   Cocaine Metabolite,Ur [place] NONE DETECTED NONE DETECTED   Opiate, Ur Screen NONE DETECTED NONE DETECTED   Phencyclidine (PCP) Ur S NONE DETECTED NONE DETECTED   Cannabinoid 50 Ng, Ur [place] NONE DETECTED NONE DETECTED   Barbiturates, Ur Screen NONE DETECTED NONE DETECTED   Benzodiazepine, Ur Scrn NONE DETECTED NONE DETECTED   Methadone Scn, Ur NONE DETECTED NONE DETECTED    Comment: (NOTE) 258  Tricyclics, urine               Cutoff 1000 ng/mL 200  Amphetamines, urine             Cutoff 1000 ng/mL 300  MDMA (Ecstasy), urine           Cutoff 500 ng/mL 400  Cocaine Metabolite, urine       Cutoff 300 ng/mL 500  Opiate, urine                   Cutoff 300 ng/mL 600  Phencyclidine (PCP), urine  Cutoff 25 ng/mL 700  Cannabinoid, urine              Cutoff 50 ng/mL 800  Barbiturates, urine             Cutoff 200 ng/mL 900  Benzodiazepine, urine           Cutoff 200 ng/mL 1000 Methadone, urine                Cutoff 300 ng/mL 1100 1200 The urine drug screen provides only a preliminary, unconfirmed 1300 analytical test result and should not be used for non-medical 1400 purposes. Clinical consideration and professional judgment  should 1500 be applied to any positive drug screen result due to possible 1600 interfering substances. A more specific alternate chemical method 1700 must be used in order to obtain a confirmed analytical result.  1800 Gas chromato graphy / mass spectrometry (GC/MS) is the preferred 1900 confirmatory method.    Current Medications: Current Facility-Administered Medications  Medication Dose Route Frequency Provider Last Rate Last Dose  . acetaminophen (TYLENOL) tablet 650 mg  650 mg Oral Q6H PRN Gonzella Lex, MD      . alum & mag hydroxide-simeth (MAALOX/MYLANTA) 200-200-20 MG/5ML suspension 30 mL  30 mL Oral Q4H PRN Gonzella Lex, MD      . hydrALAZINE (APRESOLINE) tablet 25 mg  25 mg Oral Q8H PRN Hildred Priest, MD      . lisinopril (PRINIVIL,ZESTRIL) tablet 10 mg  10 mg Oral Daily Hildred Priest, MD   10 mg at 10/31/14 0959  . magnesium hydroxide (MILK OF MAGNESIA) suspension 30 mL  30 mL Oral Daily PRN Gonzella Lex, MD      . nicotine (NICOTROL) 10 MG inhaler 1 continuous puffing  1 continuous puffing Inhalation PRN Gonzella Lex, MD      . OLANZapine (ZYPREXA) tablet 10 mg  10 mg Oral BID Hildred Priest, MD   10 mg at 10/31/14 0959  . traZODone (DESYREL) tablet 50 mg  50 mg Oral QHS Marjie Skiff, MD   50 mg at 10/31/14 0116   PTA Medications: Prescriptions prior to admission  Medication Sig Dispense Refill Last Dose  . lurasidone (LATUDA) 40 MG TABS tablet Take 40 mg by mouth daily with breakfast.   10/28/2014 at Unknown time    Previous Psychotropic Medications: Yes   Substance Abuse History in the last 12 months:  No.    Consequences of Substance Abuse: Negative  Results for orders placed or performed during the hospital encounter of 10/29/14 (from the past 72 hour(s))  CBC WITH DIFFERENTIAL     Status: Abnormal   Collection Time: 10/29/14  4:48 PM  Result Value Ref Range   WBC 6.8 3.8 - 10.6 K/uL   RBC 4.37 (L) 4.40 - 5.90  MIL/uL   Hemoglobin 13.8 13.0 - 18.0 g/dL   HCT 41.2 40.0 - 52.0 %   MCV 94.2 80.0 - 100.0 fL   MCH 31.6 26.0 - 34.0 pg   MCHC 33.5 32.0 - 36.0 g/dL   RDW 13.4 11.5 - 14.5 %   Platelets 183 150 - 440 K/uL   Neutrophils Relative % 60 %   Neutro Abs 4.2 1.4 - 6.5 K/uL   Lymphocytes Relative 30 %   Lymphs Abs 2.0 1.0 - 3.6 K/uL   Monocytes Relative 7 %   Monocytes Absolute 0.5 0.2 - 1.0 K/uL   Eosinophils Relative 2 %   Eosinophils Absolute 0.1 0 - 0.7 K/uL  Basophils Relative 1 %   Basophils Absolute 0.0 0 - 0.1 K/uL  Comprehensive metabolic panel     Status: Abnormal   Collection Time: 10/29/14  4:48 PM  Result Value Ref Range   Sodium 136 135 - 145 mmol/L   Potassium 3.9 3.5 - 5.1 mmol/L   Chloride 101 101 - 111 mmol/L   CO2 29 22 - 32 mmol/L   Glucose, Bld 67 65 - 99 mg/dL   BUN 27 (H) 6 - 20 mg/dL   Creatinine, Ser 1.42 (H) 0.61 - 1.24 mg/dL   Calcium 8.4 (L) 8.9 - 10.3 mg/dL   Total Protein 7.1 6.5 - 8.1 g/dL   Albumin 4.5 3.5 - 5.0 g/dL   AST 29 15 - 41 U/L   ALT 18 17 - 63 U/L   Alkaline Phosphatase 59 38 - 126 U/L   Total Bilirubin 0.5 0.3 - 1.2 mg/dL   GFR calc non Af Amer 54 (L) >60 mL/min   GFR calc Af Amer >60 >60 mL/min    Comment: (NOTE) The eGFR has been calculated using the CKD EPI equation. This calculation has not been validated in all clinical situations. eGFR's persistently <60 mL/min signify possible Chronic Kidney Disease.    Anion gap 6 5 - 15  Ethanol     Status: None   Collection Time: 10/29/14  4:48 PM  Result Value Ref Range   Alcohol, Ethyl (B) <5 <5 mg/dL    Comment:        LOWEST DETECTABLE LIMIT FOR SERUM ALCOHOL IS 11 mg/dL FOR MEDICAL PURPOSES ONLY   Urinalysis complete, with microscopic [facility])     Status: Abnormal   Collection Time: 10/29/14  4:48 PM  Result Value Ref Range   Color, Urine STRAW (A) YELLOW   APPearance CLEAR (A) CLEAR   Glucose, UA NEGATIVE NEGATIVE mg/dL   Bilirubin Urine NEGATIVE NEGATIVE   Ketones, ur  NEGATIVE NEGATIVE mg/dL   Specific Gravity, Urine 1.010 1.005 - 1.030   Hgb urine dipstick 2+ (A) NEGATIVE   pH 6.0 5.0 - 8.0   Protein, ur NEGATIVE NEGATIVE mg/dL   Nitrite NEGATIVE NEGATIVE   Leukocytes, UA NEGATIVE NEGATIVE   RBC / HPF 0-5 0 - 5 RBC/hpf   WBC, UA NONE SEEN 0 - 5 WBC/hpf   Bacteria, UA NONE SEEN NONE SEEN   Squamous Epithelial / LPF NONE SEEN NONE SEEN  Urine Drug Screen, Qualitative [facility])     Status: None   Collection Time: 10/29/14  4:48 PM  Result Value Ref Range   Tricyclic, Ur Screen NONE DETECTED NONE DETECTED   Amphetamines, Ur Screen NONE DETECTED NONE DETECTED   MDMA (Ecstasy)Ur Screen NONE DETECTED NONE DETECTED   Cocaine Metabolite,Ur [place] NONE DETECTED NONE DETECTED   Opiate, Ur Screen NONE DETECTED NONE DETECTED   Phencyclidine (PCP) Ur S NONE DETECTED NONE DETECTED   Cannabinoid 50 Ng, Ur [place] NONE DETECTED NONE DETECTED   Barbiturates, Ur Screen NONE DETECTED NONE DETECTED   Benzodiazepine, Ur Scrn NONE DETECTED NONE DETECTED   Methadone Scn, Ur NONE DETECTED NONE DETECTED    Comment: (NOTE) 161  Tricyclics, urine               Cutoff 1000 ng/mL 200  Amphetamines, urine             Cutoff 1000 ng/mL 300  MDMA (Ecstasy), urine           Cutoff 500 ng/mL 400  Cocaine Metabolite, urine  Cutoff 300 ng/mL 500  Opiate, urine                   Cutoff 300 ng/mL 600  Phencyclidine (PCP), urine      Cutoff 25 ng/mL 700  Cannabinoid, urine              Cutoff 50 ng/mL 800  Barbiturates, urine             Cutoff 200 ng/mL 900  Benzodiazepine, urine           Cutoff 200 ng/mL 1000 Methadone, urine                Cutoff 300 ng/mL 1100 1200 The urine drug screen provides only a preliminary, unconfirmed 1300 analytical test result and should not be used for non-medical 1400 purposes. Clinical consideration and professional judgment should 1500 be applied to any positive drug screen result due to possible 1600 interfering substances. A more specific  alternate chemical method 1700 must be used in order to obtain a confirmed analytical result.  1800 Gas chromato graphy / mass spectrometry (GC/MS) is the preferred 1900 confirmatory method.     Psychological Evaluations: No   Treatment Plan Summary: Daily contact with patient to assess and evaluate symptoms and progress in treatment and Medication management  Schizoaffective disorder: I will discontinue Latuda 40 mg at this medication is not effective in controlling the patient's symptomatology. Instead I will start him on olanzapine as he has responded to this medication in the past.  Patient will be started on 15 50 mg by mouth daily at bedtime.  Hypertension the patient has been is started on lisinopril 10 mg by mouth daily. His diet has has been changed to low sodium. He has also been is started on hydralazine 25 mg every 8 hours as needed. Vital signs will be checked every 8 hours  For nicotine withdrawal I will start the patient on a nicotine patch of 21 mg  For insomnia the patient will be started on trazodone 100 mg by mouth daily at bedtime when necessary  For agitation O start the patient on Ativan 2 mg every 8 hours as needed  Hospitalization status he'll be continued on involuntary commitment  Precautions continue every 15 minute checks  Collateral information: We will need to obtain collateral information from the patient's nephew.  Medical Decision Making:  Established Problem, Worsening (2)  I certify that inpatient services furnished can reasonably be expected to improve the patient's condition.   Hildred Priest 5/26/201611:42 AM

## 2014-10-31 NOTE — Progress Notes (Signed)
Recreation Therapy Notes  INPATIENT RECREATION THERAPY ASSESSMENT  Patient Details Name: Ian Cook MRN: [id number] DOB: Jun 30, 1960 Today's Date: 10/31/2014  Patient Stressors: Other (Comment) (Sleep schedule. Sleeping one night and not the next.)  Coping Skills:   Isolate, Arguments, Avoidance, Exercise, Talking, Music, Sports, Other (Comment) (Cigarettes)  Personal Challenges: Decision-Making, Expressing Yourself, Time Management  Leisure Interests (2+):  Individual - Other (Comment) (Driving, movies)  Awareness of Community Resources:  Yes  Community Resources:  Park  Current Use: Yes  If no, Barriers?:    Patient Strengths:  Good at working on automobiles  Patient Identified Areas of Improvement:  I don't know  Current Recreation Participation:  I don't know  Patient Goal for Hospitalization:  To help others  [place] of Residence:  [place] of Residence:  [place]   Current [place] (including self-harm):  No  Current [place]:  No  Consent to Intern Participation: N/A   Leonette Monarch, LRT/CTRS 10/31/2014, 2:51 PM

## 2014-10-31 NOTE — Progress Notes (Signed)
Recreation Therapy Notes  Date: 05.26.16 Time: 3:00 pm Location: Craft Room  Group Topic: Leisure Education  Goal Area(s) Addresses:  Patient will write at least one healthy leisure activity. Patient will write his/her leisure goal for next year.  Behavioral Response: Attentive  Intervention: Leisure Teacher, early years/pre  Activity: Patients were given a Leisure Insurance account manager and instructed to fill out as many healthy leisure activities that they wanted to participate in, but had not had the chance to yet. Patients were instructed to list a leisure goal for next year.  Education: LRT educated patient on leisure and what is needed to participate in leisure.  Education Outcome: Acknowledges education/In group clarification offered   Clinical Observations/Feedback: Patient left group at approximately 3:08 pm with MHT and returned to group at approximately 3:11 pm. Patient completed worksheet. Patient wrote down a leisure goal for next year. Patient left group at approximately 3:27 pm to use the bathroom. Patient returned to group at approximately 3:31 pm. Patient did not contribute to group discussion.   Leonette Monarch, LRT/CTRS 10/31/2014 4:26 PM

## 2014-10-31 NOTE — BHH Suicide Risk Assessment (Signed)
[facility] Admission Suicide Risk Assessment   Nursing information obtained from:  Patient Demographic factors:  Male, Divorced or widowed, Unemployed Current Mental Status:  NA Loss Factors:  NA Historical Factors:  Prior suicide attempts, Family history of mental illness or substance abuse Risk Reduction Factors:  Positive therapeutic relationship Total Time spent with patient: 1 hour Principal Problem: <principal problem not specified> Diagnosis:   Patient Active Problem List   Diagnosis Date Noted  . Schizophrenia [F20.9] 10/30/2014  . Schizoaffective disorder, manic type [F25.0] 10/30/2014  . Schizoaffective disorder, unspecified type [F25.9]      Continued Clinical Symptoms:  Alcohol Use Disorder Identification Test Final Score (AUDIT): 0 The "Alcohol Use Disorders Identification Test", Guidelines for Use in Primary Care, Second Edition.  World Pharmacologist [employer]). Score between 0-7:  no or low risk or alcohol related problems. Score between 8-15:  moderate risk of alcohol related problems. Score between 16-19:  high risk of alcohol related problems. Score 20 or above:  warrants further diagnostic evaluation for alcohol dependence and treatment.   CLINICAL FACTORS:   Schizophrenia:   Command hallucinatons    Psychiatric Specialty Exam: Physical Exam  ROS    COGNITIVE FEATURES THAT CONTRIBUTE TO RISK:  None    SUICIDE RISK:   Moderate:  Frequent suicidal ideation with limited intensity, and duration, some specificity in terms of plans, no associated intent, good self-control, limited dysphoria/symptomatology, some risk factors present, and identifiable protective factors, including available and accessible social support.  PLAN OF CARE: Admit to behavioral health  Medical Decision Making:  Established Problem, Worsening (2)  I certify that inpatient services furnished can reasonably be expected to improve the patient's condition.   Hildred Priest 10/31/2014, 11:51 AM

## 2014-11-01 DIAGNOSIS — F17200 Nicotine dependence, unspecified, uncomplicated: Secondary | ICD-10-CM

## 2014-11-01 DIAGNOSIS — I10 Essential (primary) hypertension: Secondary | ICD-10-CM | POA: Diagnosis present

## 2014-11-01 HISTORY — DX: Essential (primary) hypertension: I10

## 2014-11-01 HISTORY — DX: Nicotine dependence, unspecified, uncomplicated: F17.200

## 2014-11-01 LAB — LIPID PANEL
Cholesterol: 153 mg/dL (ref 0–200)
HDL: 67 mg/dL (ref >40)
LDL Cholesterol: 75 mg/dL (ref 0–99)
Total CHOL/HDL Ratio: 2.3 RATIO
Triglycerides: 54 mg/dL (ref <150)
VLDL: 11 mg/dL (ref 0–40)

## 2014-11-01 LAB — TSH: TSH: 0.749 u[IU]/mL (ref 0.350–4.500)

## 2014-11-01 LAB — HEMOGLOBIN A1C: Hgb A1c MFr Bld: 5.3 % (ref 4.0–6.0)

## 2014-11-01 MED ORDER — NICOTINE 10 MG IN INHA
1.0000 | RESPIRATORY_TRACT | Status: DC | PRN
  Administered 2014-11-01: 1 via RESPIRATORY_TRACT
  Filled 2014-11-01: qty 36

## 2014-11-01 NOTE — BHH Group Notes (Signed)
[facility] LCSW Aftercare Discharge Planning Group Note  11/01/2014 3:02 PM  Participation Quality:  Appropriate and Attentive  Affect:  Appropriate  Cognitive:  Alert and Appropriate  Insight:  Engaged  Engagement in Group:  Engaged  Modes of Intervention:  Socialization and Support  Summary of Progress/Problems: Patient attended and participated appropriately. Patient shared that his SMART goal is to "get a job when I get out not for money but to keep me busy".   Carmell Austria T 11/01/2014, 3:02 PM

## 2014-11-01 NOTE — BHH Group Notes (Signed)
[place] Group Notes:  (Nursing/MHT/Case Management/Adjunct)  Date:  11/01/2014  Time:  12:38 PM  Type of Therapy:  Psychoeducational Skills  Participation Level:  Active  Participation Quality:  Appropriate  Affect:  Appropriate  Cognitive:  Appropriate  Insight:  Appropriate  Engagement in Group:  Engaged  Modes of Intervention:  Activity  Summary of Progress/Problems:  Michela Pitcher 11/01/2014, 12:38 PM

## 2014-11-01 NOTE — Progress Notes (Signed)
   D: Writer was laying in bed awake with lights on prior to the assessment. Initially pt denied having any questions or concerns. However almost immediately pt stated "Yeah, when do I get to go home".  A: Encouraged pt to speak with his dr in the morning. Support and encouragement was offered. 15 min checks continued for safety.  R: Pt remains safe.

## 2014-11-01 NOTE — BHH Group Notes (Signed)
[place] LCSW Group Therapy  11/01/2014 3:23 PM  Type of Therapy:  Group Therapy  Participation Level:  Active  Participation Quality:  Appropriate and Attentive  Affect:  Appropriate  Cognitive:  Alert and Appropriate  Insight:  Engaged  Engagement in Therapy:  Engaged  Modes of Intervention:  Socialization and Support  Summary of Progress/Problems: Patient attended and participated appropriately. Patient shared that his super power is "Jesus Christ....save souls, my higher power, its okay we are not in control of everything".  Carmell Austria T 11/01/2014, 3:23 PM

## 2014-11-01 NOTE — Progress Notes (Signed)
[facility] MD Progress Note  11/01/2014 11:54 AM Ian Cook  MRN:  [id number] Subjective:  Patient reports doing well this morning. Denies problems with mood, appetite, energy or concentration. Denies suicidal ideation or homicidal ideation. Denies any auditory or visual hallucinations. Patient has been compliant with his medications and denies any side effects.   Denies having any physical complaints this morning. Per nursing there were no events overnight.  He did not receive olanzapine last night appears that the system schedule her for this evening.  Principal Problem: Schizoaffective disorder, manic type Diagnosis:   Patient Active Problem List   Diagnosis Date Noted  . HTN (hypertension) [I10] 11/01/2014  . Tobacco use disorder [Z72.0] 11/01/2014  . Schizoaffective disorder, manic type [F25.0] 10/30/2014   Total Time spent with patient: 30 minutes   Past Medical History:  Past Medical History  Diagnosis Date  . Hypertension   . Schizo-affective schizophrenia    History reviewed. No pertinent past surgical history. Family History: History reviewed. No pertinent family history. Social History:  History  Alcohol Use No     History  Drug Use No    History   Social History  . Marital Status: Married    Spouse Name: N/A  . Number of Children: N/A  . Years of Education: N/A   Social History Main Topics  . Smoking status: Current Every Day Smoker    Types: Cigarettes  . Smokeless tobacco: Not on file  . Alcohol Use: No  . Drug Use: No  . Sexual Activity: Not on file   Other Topics Concern  . None   Social History Narrative   Additional History:    Sleep: Good  Appetite:  Negative   Assessment:   Musculoskeletal: Strength & Muscle Tone: within normal limits Gait & Station: normal Patient leans: N/A   Psychiatric Specialty Exam: Physical Exam  Review of Systems  HENT: Negative.   Cardiovascular: Negative.   Gastrointestinal: Negative.   Genitourinary:  Negative.   Musculoskeletal: Negative.   Neurological: Negative.   Psychiatric/Behavioral: Negative.     Blood pressure 129/75, pulse 69, temperature 98.6 F (37 C), temperature source Oral, resp. rate 20, height 6\' 3"  (1.905 m), weight 71.215 kg (157 lb).Body mass index is 19.62 kg/(m^2).  General Appearance: Fairly Groomed  Engineer, water::  Good  Speech:  Normal Rate  Volume:  Normal  Mood:  Euthymic  Affect:  Congruent  Thought Process:  Tangential  Orientation:  Full (Time, Place, and Person)  Thought Content:  Hallucinations: None  Suicidal Thoughts:  No  Homicidal Thoughts:  No  Memory:  Immediate;   Good Recent;   Good Remote;   Good  Judgement:  Impaired  Insight:  Lacking  Psychomotor Activity:  Increased  Concentration:  Fair  Recall:  NA  Fund of Knowledge:Fair  Language: Good  Akathisia:  No  Handed:    AIMS (if indicated):     Assets:  Financial Resources/Insurance Housing Social Support  ADL's:  Intact  Cognition: WNL  Sleep:  Number of Hours: 8.5     Current Medications: Current Facility-Administered Medications  Medication Dose Route Frequency Provider Last Rate Last Dose  . acetaminophen (TYLENOL) tablet 650 mg  650 mg Oral Q6H PRN Gonzella Lex, MD      . alum & mag hydroxide-simeth (MAALOX/MYLANTA) 200-200-20 MG/5ML suspension 30 mL  30 mL Oral Q4H PRN Gonzella Lex, MD      . hydrALAZINE (APRESOLINE) tablet 25 mg  25 mg Oral Q8H PRN  Hildred Priest, MD      . ibuprofen (ADVIL,MOTRIN) tablet 600 mg  600 mg Oral Q6H PRN Hildred Priest, MD      . lisinopril (PRINIVIL,ZESTRIL) tablet 10 mg  10 mg Oral Daily Hildred Priest, MD   10 mg at 11/01/14 1009  . magnesium hydroxide (MILK OF MAGNESIA) suspension 30 mL  30 mL Oral Daily PRN Gonzella Lex, MD      . nicotine (NICOTROL) 10 MG inhaler 1 continuous puffing  1 continuous puffing Inhalation PRN Hildred Priest, MD      . OLANZapine (ZYPREXA) tablet 15 mg  15  mg Oral QHS Hildred Priest, MD      . traZODone (DESYREL) tablet 100 mg  100 mg Oral QHS Hildred Priest, MD   100 mg at 10/31/14 2319    Lab Results:  Results for orders placed or performed during the hospital encounter of 10/30/14 (from the past 48 hour(s))  TSH     Status: None   Collection Time: 11/01/14  6:46 AM  Result Value Ref Range   TSH 0.749 0.350 - 4.500 uIU/mL  Lipid panel     Status: None   Collection Time: 11/01/14  6:46 AM  Result Value Ref Range   Cholesterol 153 0 - 200 mg/dL   Triglycerides 54 <150 mg/dL   HDL 67 >40 mg/dL   Total CHOL/HDL Ratio 2.3 RATIO   VLDL 11 0 - 40 mg/dL   LDL Cholesterol 75 0 - 99 mg/dL    Comment:        Total Cholesterol/HDL:CHD Risk Coronary Heart Disease Risk Table                     Men   Women  1/2 Average Risk   3.4   3.3  Average Risk       5.0   4.4  2 X Average Risk   9.6   7.1  3 X Average Risk  23.4   11.0        Use the calculated Patient Ratio above and the CHD Risk Table to determine the patient's CHD Risk.        ATP III CLASSIFICATION (LDL):  <100     mg/dL   Optimal  100-129  mg/dL   Near or Above                    Optimal  130-159  mg/dL   Borderline  160-189  mg/dL   High  >190     mg/dL   Very High     Physical Findings: AIMS: Facial and Oral Movements Muscles of Facial Expression: None, normal Lips and Perioral Area: None, normal Jaw: None, normal Tongue: None, normal,Extremity Movements Upper (arms, wrists, hands, fingers): None, normal Lower (legs, knees, ankles, toes): None, normal, Trunk Movements Neck, shoulders, hips: None, normal, Overall Severity Severity of abnormal movements (highest score from questions above): None, normal Incapacitation due to abnormal movements: None, normal Patient's awareness of abnormal movements (rate only patient's report): No Awareness, Dental Status Current problems with teeth and/or dentures?: No Does patient usually wear dentures?: No   CIWA:    COWS:     Treatment Plan Summary: Daily contact with patient to assess and evaluate symptoms and progress in treatment and Medication management   Schizoaffective disorder: I will discontinue Latuda 40 mg at this medication is not effective in controlling the patient's symptomatology. Instead I will start him on olanzapine as he has  responded to this medication in the past. Patient will be started on Zyprexa 15 mg by mouth daily at bedtime.  Hypertension: Continue lisinopril 10 mg by mouth daily. Blood pressure and heart rate are within normal limits today. Continue low sodium diet.  For now continue orders for vital signs every 8 hours and hydralazine 25 mg every 8 hours when necessary however over the weekend his might be able to be discontinued if blood pressure continues to be normal.  For nicotine withdrawal: Patient will be started on Nicotrol inhaler  For insomnia the patient will be started on trazodone 100 mg by mouth daily at bedtime when necessary  For agitation O start the patient on Ativan 2 mg every 8 hours as needed  Hospitalization status: Patient agrees to sign in as voluntary today  Precautions continue every 15 minute checks  Collateral information: We will need to obtain collateral information from the patient's nephew.   Medical Decision Making:  Established Problem, Stable/Improving (1)     Hildred Priest 11/01/2014, 11:54 AM

## 2014-11-01 NOTE — Progress Notes (Signed)
Recreation Therapy Notes  Date: 05.27.16 Time: 3:00 pm Location: Craft Room  Group Topic: Communication, Problem solving, Teamwork  Goal Area(s) Addresses:  Patient will work in teams towards shared goal. Patient will verbalize skills needed to make activity successful. Patient will verbalize benefit of using skills identify to reach post d/c goals.  Behavioral Response: Attentive, Left early  Intervention: Landing Pad  Activity: Patients were given 12 straws and approximately 2.5 feet of tape and instructed to build a lading pad to catch a golf ball from 4 feet.   Education: LRT educated patient on communication, problem solving, and teamwork and why these skills are important.   Education Outcome: Acknowledges education/In group clarification offered  Clinical Observations/Feedback: Patient watched as peers built landing pad. Patient left group at approximately 3:35 pm to use the restroom. Patient did not return to group.   Leonette Monarch, LRT/CTRS 11/01/2014 4:32 PM

## 2014-11-01 NOTE — Tx Team (Signed)
Initial Interdisciplinary Treatment Plan   PATIENT STRESSORS:  PATIENT STRENGTHS: PROBLEM LIST: Problem List/Patient Goals Date to be addressed Date deferred Reason deferred Estimated date of resolution                                                         DISCHARGE CRITERIA:  PRELIMINARY DISCHARGE PLAN: This treatment plan has been presented to and reviewed with the patient, Ian Cook, and/or family member, .  The patient and family have been given the opportunity to ask questions and make suggestions.  Ian Cook B 11/01/2014, 11:00 AM

## 2014-11-01 NOTE — Progress Notes (Signed)
Pleasant and cooperative.  Denies SI, depression or AVH.  No problems or concerns voiced.  Maintains personal care chores appropriately.  Good appetite.

## 2014-11-01 NOTE — [facility] Group Notes (Signed)
[place] Group Notes:  (Nursing/MHT/Case Management/Adjunct)  Date:  11/01/2014  Time:  12:12 AM  Type of Therapy:  Group Therapy  Participation Level:  Active  Participation Quality:  Appropriate and Attentive  Affect:  Appropriate  Cognitive:  Appropriate  Insight:  Appropriate  Engagement in Group:  Engaged  Modes of Intervention:  Discussion  Summary of Progress/Problems:  Ian Cook Joy Vincen Bejar 11/01/2014, 12:12 AM

## 2014-11-01 NOTE — Progress Notes (Signed)
Patient currently in bed awake. Alert and oriented X4, pleasant and cooperative. Denying hearing voices, denying suicidal thoughts. Reports that his day went well and has no concern so far. Was encouraged to be in the milieu with staff and peers. Patient stated "oh I have no problem with that, I have been around,..ibuprofen am just resting". No sign of discomfort noted. Support and encouragements offered and safety maintained.

## 2014-11-02 NOTE — Progress Notes (Signed)
Patient has been alert and oriented today.  Affect has been blunted but he does brighten up and smile upon approach.  Patient has good eye contact. He has been medication and group compliant.  Denies suicidal or homicidal thoughts.  Denies auditory or visual hallucinations.Assisted with shaving and hygiene  Q 15 min checks in place.  Will cont to monitor for safety.

## 2014-11-02 NOTE — Progress Notes (Signed)
Nursing care taken over at [place] patient resting in bed at that time with eyes closed , no issues to report on shift thus far.

## 2014-11-02 NOTE — Progress Notes (Signed)
[facility] MD Progress Note  11/02/2014 5:18 PM Ian Cook  MRN:  [id number] Subjective:  Patient is a 54 year old male who was seen for follow-up. He reported that he has started feeling better and wants to look for a job. He has not worked since 1984. He reported that he was having some issues at the family care home and does not want to return at the same place due to the people being intrusive over there. Patient reported that the medications are currently helping him and his mood and appetite are improving.Denies suicidal ideation or homicidal ideation. Denies any auditory or visual hallucinations. Patient has been compliant with his medications and denies any side effects.   Denies having any physical complaints this morning. Per nursing there were no events overnight.      Principal Problem: Schizoaffective disorder, manic type Diagnosis:   Patient Active Problem List   Diagnosis Date Noted  . HTN (hypertension) [I10] 11/01/2014  . Tobacco use disorder [Z72.0] 11/01/2014  . Schizoaffective disorder, manic type [F25.0] 10/30/2014   Total Time spent with patient: 30 minutes   Past Medical History:  Past Medical History  Diagnosis Date  . Hypertension   . Schizo-affective schizophrenia    History reviewed. No pertinent past surgical history. Family History: History reviewed. No pertinent family history. Social History:  History  Alcohol Use No     History  Drug Use No    History   Social History  . Marital Status: Married    Spouse Name: N/A  . Number of Children: N/A  . Years of Education: N/A   Social History Main Topics  . Smoking status: Current Every Day Smoker    Types: Cigarettes  . Smokeless tobacco: Not on file  . Alcohol Use: No  . Drug Use: No  . Sexual Activity: Not on file   Other Topics Concern  . None   Social History Narrative   Additional History:    Sleep: Good  Appetite:  Fair   Assessment:   Musculoskeletal: Strength & Muscle Tone:  within normal limits Gait & Station: normal Patient leans: N/A   Psychiatric Specialty Exam: Physical Exam   Review of Systems  Constitutional: Negative for fever and chills.  HENT: Negative for ear pain.   Eyes: Negative for photophobia.  Respiratory: Negative for sputum production.   Cardiovascular: Negative for orthopnea.  Gastrointestinal: Negative for nausea and diarrhea.  Genitourinary: Negative for urgency.  Skin: Negative for rash.  Neurological: Positive for speech change. Negative for tremors and weakness.  Endo/Heme/Allergies: Negative for environmental allergies.  Psychiatric/Behavioral: Positive for depression. Negative for suicidal ideas and hallucinations. The patient is not nervous/anxious and does not have insomnia.     Blood pressure 127/85, pulse 78, temperature 98.6 F (37 C), temperature source Oral, resp. rate 20, height 6\' 3"  (1.905 m), weight 71.215 kg (157 lb).Body mass index is 19.62 kg/(m^2).  General Appearance: Fairly Groomed  Engineer, water::  Good  Speech:  Normal Rate  Volume:  Normal  Mood:  Euthymic  Affect:  Congruent  Thought Process:  Tangential  Orientation:  Full (Time, Place, and Person)  Thought Content:  Hallucinations: None  Suicidal Thoughts:  No  Homicidal Thoughts:  No  Memory:  Immediate;   Good Recent;   Good Remote;   Good  Judgement:  Impaired  Insight:  Lacking  Psychomotor Activity:  Increased  Concentration:  Fair  Recall:  NA  Fund of Knowledge:Fair  Language: Good  Akathisia:  No  Handed:  AIMS (if indicated):     Assets:  Financial Resources/Insurance Housing Social Support  ADL's:  Intact  Cognition: WNL  Sleep:  Number of Hours: 6.5     Current Medications: Current Facility-Administered Medications  Medication Dose Route Frequency Provider Last Rate Last Dose  . acetaminophen (TYLENOL) tablet 650 mg  650 mg Oral Q6H PRN Gonzella Lex, MD      . alum & mag hydroxide-simeth (MAALOX/MYLANTA) 200-200-20  MG/5ML suspension 30 mL  30 mL Oral Q4H PRN Gonzella Lex, MD      . hydrALAZINE (APRESOLINE) tablet 25 mg  25 mg Oral Q8H PRN Hildred Priest, MD      . ibuprofen (ADVIL,MOTRIN) tablet 600 mg  600 mg Oral Q6H PRN Hildred Priest, MD      . lisinopril (PRINIVIL,ZESTRIL) tablet 10 mg  10 mg Oral Daily Hildred Priest, MD   10 mg at 11/02/14 0919  . magnesium hydroxide (MILK OF MAGNESIA) suspension 30 mL  30 mL Oral Daily PRN Gonzella Lex, MD      . nicotine (NICOTROL) 10 MG inhaler 1 continuous puffing  1 continuous puffing Inhalation PRN Hildred Priest, MD   1 continuous puffing at 11/01/14 1225  . OLANZapine (ZYPREXA) tablet 15 mg  15 mg Oral QHS Hildred Priest, MD   15 mg at 11/01/14 2129  . traZODone (DESYREL) tablet 100 mg  100 mg Oral QHS Hildred Priest, MD   100 mg at 11/01/14 2129    Lab Results:  Results for orders placed or performed during the hospital encounter of 10/30/14 (from the past 48 hour(s))  TSH     Status: None   Collection Time: 11/01/14  6:46 AM  Result Value Ref Range   TSH 0.749 0.350 - 4.500 uIU/mL  Lipid panel     Status: None   Collection Time: 11/01/14  6:46 AM  Result Value Ref Range   Cholesterol 153 0 - 200 mg/dL   Triglycerides 54 <150 mg/dL   HDL 67 >40 mg/dL   Total CHOL/HDL Ratio 2.3 RATIO   VLDL 11 0 - 40 mg/dL   LDL Cholesterol 75 0 - 99 mg/dL    Comment:        Total Cholesterol/HDL:CHD Risk Coronary Heart Disease Risk Table                     Men   Women  1/2 Average Risk   3.4   3.3  Average Risk       5.0   4.4  2 X Average Risk   9.6   7.1  3 X Average Risk  23.4   11.0        Use the calculated Patient Ratio above and the CHD Risk Table to determine the patient's CHD Risk.        ATP III CLASSIFICATION (LDL):  <100     mg/dL   Optimal  100-129  mg/dL   Near or Above                    Optimal  130-159  mg/dL   Borderline  160-189  mg/dL   High  >190     mg/dL    Very High   Hemoglobin A1c     Status: None   Collection Time: 11/01/14  6:46 AM  Result Value Ref Range   Hgb A1c MFr Bld 5.3 4.0 - 6.0 %    Physical Findings: AIMS: Facial and Oral  Movements Muscles of Facial Expression: None, normal Lips and Perioral Area: None, normal Jaw: None, normal Tongue: None, normal,Extremity Movements Upper (arms, wrists, hands, fingers): None, normal Lower (legs, knees, ankles, toes): None, normal, Trunk Movements Neck, shoulders, hips: None, normal, Overall Severity Severity of abnormal movements (highest score from questions above): None, normal Incapacitation due to abnormal movements: None, normal Patient's awareness of abnormal movements (rate only patient's report): No Awareness, Dental Status Current problems with teeth and/or dentures?: No Does patient usually wear dentures?: No  CIWA:    COWS:     Treatment Plan Summary: Daily contact with patient to assess and evaluate symptoms and progress in treatment and Medication management   Schizoaffective disorder: Patient will be on Zyprexa 15 mg by mouth daily at bedtime.  Hypertension: Continue lisinopril 10 mg by mouth daily. Blood pressure and heart rate are within normal limits today. Continue low sodium diet.  For now continue orders for vital signs every 8 hours and hydralazine 25 mg every 8 hours when necessary however over the weekend his might be able to be discontinued if blood pressure continues to be normal.  For nicotine withdrawal: Patient will be started on Nicotrol inhaler  For insomnia the patient will be started on trazodone 100 mg by mouth daily at bedtime when necessary  For agitation start the patient on Ativan 2 mg every 8 hours as needed  Hospitalization status: Patient agrees to sign in as voluntary today  Precautions continue every 15 minute checks  Collateral information: We will need to obtain collateral information from the patient's nephew.   Medical Decision  Making:  Established Problem, Stable/Improving (1)    Thank you for allowing me to participate in the care of this patient   This note was generated in part or whole with voice recognition software. Voice regonition is usually quite accurate but there are transcription errors that can and very often do occur. I apologize for any typographical errors that were not detected and corrected.      Rainey Pines 11/02/2014, 5:18 PM

## 2014-11-03 NOTE — Plan of Care (Signed)
Problem: Ineffective individual coping Goal: STG: Patient will remain free from self harm Outcome: Progressing Patient denies any SI at present, and contracts for safety.

## 2014-11-03 NOTE — Progress Notes (Signed)
Patient denies feeling depressed or anxious; denies any SI and contracts for safety; denies feeling anxious; does admit to still having auditory hallucinations, states "The voices still come and go, but I'm just hearing noises now." No further voiced complaints; continue to monitor.

## 2014-11-03 NOTE — [facility] Group Notes (Signed)
[place] LCSW Group Therapy  11/03/2014 1:22 PM  Type of Therapy:  Group Therapy  Participation Level:  Active  Participation Quality:  Attentive  Affect:  Appropriate  Cognitive:  Confused  Insight:  Developing/Improving  Engagement in Therapy:  Developing/Improving  Modes of Intervention:  Discussion, Education, Exploration and Support  Summary of Progress/Problems:Todays group focus was discussing community resources and ways to remain well in the community vs hospitalization. All patients were handed a Futures trader for [facility] from the [employer]. Each group member was encouraged to discuss resources they access and the benefits. After pts were encouraged to support their peers.Patient was a little slow going when he would first speak but then was able to remain insightful and was able to connect with peers and also be supportive.  [place] 11/03/2014, 1:22 PM

## 2014-11-03 NOTE — [facility] Group Notes (Signed)
[place] Group Notes:  (Nursing/MHT/Case Management/Adjunct)  Date:  11/02/2014  Time:  2030  Type of Therapy:  Group Therapy  Participation Level:  Active  Participation Quality:  Appropriate  Affect:  Appropriate  Cognitive:  Appropriate  Insight:  Appropriate  Engagement in Group:  Engaged  Modes of Intervention:  Discussion  Summary of Progress/Problems:  Ian Cook L Ian Cook 11/03/2014, 4:45 AM

## 2014-11-03 NOTE — Progress Notes (Signed)
[facility] MD Progress Note  11/03/2014 4:21 PM Ian Cook  MRN:  [id number] Subjective:  Patient is a 54 year old male who was seen for follow-up. He reported that he has started feeling better and and has started improving. He reported that he has been attending groups on a regular basis. He sleeps well and is not having any adverse effects of his medication. He currently denied having any suicidal or homicidal ideations or plans he reported that he is not having any perceptual disturbances and mood swings. He appeared pleasant and cooperative at this time Denies having any physical complaints this morning. Per nursing there were no events overnight.      Principal Problem: Schizoaffective disorder, manic type Diagnosis:   Patient Active Problem List   Diagnosis Date Noted  . HTN (hypertension) [I10] 11/01/2014  . Tobacco use disorder [Z72.0] 11/01/2014  . Schizoaffective disorder, manic type [F25.0] 10/30/2014   Total Time spent with patient: 15 minutes   Past Medical History:  Past Medical History  Diagnosis Date  . Hypertension   . Schizo-affective schizophrenia    History reviewed. No pertinent past surgical history. Family History: History reviewed. No pertinent family history. Social History:  History  Alcohol Use No     History  Drug Use No    History   Social History  . Marital Status: Married    Spouse Name: N/A  . Number of Children: N/A  . Years of Education: N/A   Social History Main Topics  . Smoking status: Current Every Day Smoker    Types: Cigarettes  . Smokeless tobacco: Not on file  . Alcohol Use: No  . Drug Use: No  . Sexual Activity: Not on file   Other Topics Concern  . None   Social History Narrative   Additional History:    Sleep: Good  Appetite:  Fair   Assessment:   Musculoskeletal: Strength & Muscle Tone: within normal limits Gait & Station: normal Patient leans: N/A   Psychiatric Specialty Exam: Physical Exam   Review of  Systems  Constitutional: Positive for malaise/fatigue. Negative for chills.  HENT: Negative for hearing loss and nosebleeds.   Eyes: Negative for photophobia.  Respiratory: Negative for hemoptysis.   Cardiovascular: Negative for orthopnea.  Gastrointestinal: Negative for nausea.  Genitourinary: Negative for frequency.  Musculoskeletal: Positive for neck pain.  Neurological: Negative for sensory change.  Endo/Heme/Allergies: Negative for environmental allergies.  Psychiatric/Behavioral: Negative for depression and suicidal ideas. The patient is not nervous/anxious.     Blood pressure 159/93, pulse 75, temperature 98.3 F (36.8 C), temperature source Oral, resp. rate 20, height 6\' 3"  (1.905 m), weight 71.215 kg (157 lb).Body mass index is 19.62 kg/(m^2).  General Appearance: Fairly Groomed  Engineer, water::  Good  Speech:  Normal Rate  Volume:  Normal  Mood:  Euthymic  Affect:  Congruent  Thought Process:  Tangential  Orientation:  Full (Time, Place, and Person)  Thought Content:  Hallucinations: None  Suicidal Thoughts:  No  Homicidal Thoughts:  No  Memory:  Immediate;   Good Recent;   Good Remote;   Good  Judgement:  Impaired  Insight:  Lacking  Psychomotor Activity:  Increased  Concentration:  Fair  Recall:  NA  Fund of Knowledge:Fair  Language: Good  Akathisia:  No  Handed:    AIMS (if indicated):     Assets:  Financial Resources/Insurance Housing Social Support  ADL's:  Intact  Cognition: WNL  Sleep:  Number of Hours: 5.3  Current Medications: Current Facility-Administered Medications  Medication Dose Route Frequency Provider Last Rate Last Dose  . acetaminophen (TYLENOL) tablet 650 mg  650 mg Oral Q6H PRN Gonzella Lex, MD      . alum & mag hydroxide-simeth (MAALOX/MYLANTA) 200-200-20 MG/5ML suspension 30 mL  30 mL Oral Q4H PRN Gonzella Lex, MD      . hydrALAZINE (APRESOLINE) tablet 25 mg  25 mg Oral Q8H PRN Hildred Priest, MD      . ibuprofen  (ADVIL,MOTRIN) tablet 600 mg  600 mg Oral Q6H PRN Hildred Priest, MD      . lisinopril (PRINIVIL,ZESTRIL) tablet 10 mg  10 mg Oral Daily Hildred Priest, MD   10 mg at 11/03/14 0902  . magnesium hydroxide (MILK OF MAGNESIA) suspension 30 mL  30 mL Oral Daily PRN Gonzella Lex, MD      . nicotine (NICOTROL) 10 MG inhaler 1 continuous puffing  1 continuous puffing Inhalation PRN Hildred Priest, MD   1 continuous puffing at 11/01/14 1225  . OLANZapine (ZYPREXA) tablet 15 mg  15 mg Oral QHS Hildred Priest, MD   15 mg at 11/02/14 2133  . traZODone (DESYREL) tablet 100 mg  100 mg Oral QHS Hildred Priest, MD   100 mg at 11/02/14 2134    Lab Results:  No results found for this or any previous visit (from the past 48 hour(s)).  Physical Findings: AIMS: Facial and Oral Movements Muscles of Facial Expression: None, normal Lips and Perioral Area: None, normal Jaw: None, normal Tongue: None, normal,Extremity Movements Upper (arms, wrists, hands, fingers): None, normal Lower (legs, knees, ankles, toes): None, normal, Trunk Movements Neck, shoulders, hips: None, normal, Overall Severity Severity of abnormal movements (highest score from questions above): None, normal Incapacitation due to abnormal movements: None, normal Patient's awareness of abnormal movements (rate only patient's report): No Awareness, Dental Status Current problems with teeth and/or dentures?: No Does patient usually wear dentures?: No  CIWA:    COWS:     Treatment Plan Summary: Daily contact with patient to assess and evaluate symptoms and progress in treatment and Medication management   Schizoaffective disorder: Patient will be on Zyprexa 15 mg by mouth daily at bedtime.  Hypertension: Continue lisinopril 10 mg by mouth daily. Blood pressure and heart rate are within normal limits today. Continue low sodium diet.  For now continue orders for vital signs every 8 hours and  hydralazine 25 mg every 8 hours when necessary however over the weekend his might be able to be discontinued if blood pressure continues to be normal.  For nicotine withdrawal: Patient will be started on Nicotrol inhaler  For insomnia the patient will be started on trazodone 100 mg by mouth daily at bedtime when necessary  For agitation start the patient on Ativan 2 mg every 8 hours as needed  Hospitalization status: Patient agrees to sign in as voluntary today  Precautions continue every 15 minute checks  Collateral information: We will need to obtain collateral information from the patient's nephew.   Medical Decision Making:  Established Problem, Stable/Improving (1)    Thank you for allowing me to participate in the care of this patient   This note was generated in part or whole with voice recognition software. Voice regonition is usually quite accurate but there are transcription errors that can and very often do occur. I apologize for any typographical errors that were not detected and corrected.      Rainey Pines 11/03/2014, 4:21 PM

## 2014-11-03 NOTE — BHH Group Notes (Signed)
[facility] LCSW Group Therapy  11/03/2014 7:23 AM  Type of Therapy:  Group Therapy  Participation Level:  Active  Participation Quality:  Appropriate  Affect:  Appropriate  Cognitive:  Appropriate  Insight:  Engaged  Engagement in Therapy:  Engaged  Modes of Intervention:  Discussion, Education, Exploration and Support  Summary of Progress/Problems:This group topic today was suicide prevention and intervention. This patient was able to relate to peers and follow discussion and offer support and insight. Suicide prevention and intervention hand out was provided.  [place] 11/03/2014, 7:23 AM

## 2014-11-03 NOTE — BHH Counselor (Signed)
Adult Comprehensive Assessment  Patient ID: Ian Cook, male   DOB: 1960-08-07, 54 y.o.   MRN: [id number]  Information Source: Information source: Patient  Current Stressors:  Physical health (include injuries & life threatening diseases): Just became very depressed  Living/Environment/Situation:  Living Arrangements: Group Home Living conditions (as described by patient or guardian): [place] How long has patient lived in current situation?: 6 years What is atmosphere in current home: Comfortable  Family History:  Marital status: Divorced Divorced, when?: years ago What types of issues is patient dealing with in the relationship?: na Additional relationship information: na Does patient have children?: Yes How many children?: 1 How is patient's relationship with their children?: OK he never sees her  Childhood History:  By whom was/is the patient raised?: Both parents Additional childhood history information: he was treated OK Description of patient's relationship with caregiver when they were a child: close Patient's description of current relationship with people who raised him/her: parents  Does patient have siblings?: Yes Number of Siblings: 1 Description of patient's current relationship with siblings: close Did patient suffer any verbal/emotional/physical/sexual abuse as a child?: No Did patient suffer from severe childhood neglect?: No Has patient ever been sexually abused/assaulted/raped as an adolescent or adult?: No Was the patient ever a victim of a crime or a disaster?: No Witnessed domestic violence?: No Has patient been effected by domestic violence as an adult?: No  Education:  Highest grade of school patient has completed: [place] [employer] Currently a student?: No Learning disability?: No  Employment/Work Situation:   Employment situation: On disability Why is patient on disability: mental ill How long has patient been on disability: almost 20  years Patient's job has been impacted by current illness: No What is the longest time patient has a held a job?: na Where was the patient employed at that time?: na Has patient ever been in the [employer]?: No Has patient ever served in combat?: No  Financial Resources:   Financial resources: Teacher, early years/pre Does patient have a Programmer, applications or guardian?: Yes Name of representative payee or guardian: Camdon Saetern [phone]  Alcohol/Substance Abuse:   What has been your use of drugs/alcohol within the last 12 months?: none If attempted suicide, did drugs/alcohol play a role in this?: No Alcohol/Substance Abuse Treatment Hx: Denies past history Has alcohol/substance abuse ever caused legal problems?: No  Social Support System:   Patient's Community Support System: Good Describe Community Support System: He lives in [facility] and has alot of family support Type of faith/religion: Darrick Meigs How does patient's faith help to cope with current illness?: yes  Leisure/Recreation:   Leisure and Hobbies: Watching movies and TV hard to watch as 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)  Strengths/Needs:   What things does the patient do well?: stay to myself In what areas does patient struggle / problems for patient: he struggles with sleep sometimes  Discharge Plan:   Does patient have access to transportation?: Yes Will patient be returning to same living situation after discharge?: Yes Currently receiving community mental health services: Yes (From Whom) ([facility]) If no, would patient like referral for services when discharged?: Yes (What [place] Does patient have financial barriers related to discharge medications?: No  Summary/Recommendations:   Summary and Recommendations (to be completed by the evaluator): patient is a 54 year old black male with a diagnosis of schizoaffective ( manic type). He resides in [place] and Benen Weida is his payee. He is  able to  return.He follow up with [facility] and has Medicaid. He came to ED because he was a bit manic and unable to sleep. While in hospital pt is encouraged to attend group and take his medications.  Savahna Casados M. 11/03/2014

## 2014-11-03 NOTE — Progress Notes (Signed)
Pt has been pleasant and cooperative. Pts mood and affect has been depressed. Pt has been active on the unit attending most unit activities. Pt denies SI and A/V hallucinations. No inappropriate behaviors noted this period.

## 2014-11-04 NOTE — Plan of Care (Signed)
Problem: Alteration in thought process Goal: LTG-Patient behavior demonstrates decreased signs psychosis (Patient behavior demonstrates decreased signs of psychosis to the point the patient is safe to return home and continue treatment in an outpatient setting.)  Outcome: Progressing Patient denies having anymore hallucinations.

## 2014-11-04 NOTE — Progress Notes (Signed)
Recreation Therapy Notes  Date: 05.30.16 Time: 3:05 pm Location: Craft Room  Group Topic: Self-expression   Goal Area(s) Addresses:  Patient will draw a bottle. Patient will write at least one emotion they are experiencing.  Behavioral Response: Reserved/Withdrawn, Left early  Intervention: Bottled Up  Activity: Patients were instructed to draw a bottled of how they feel and write emotions they are feeling inside the bottle.   Education:LRT educated patients on different forms of self-expression.   Education Outcome: Acknowledges education/In group clarification offered  Clinical Observations/Feedback: Patient drew on paper. Patient left group at approximately 3:32 pm. Patient did not return to group.  Leonette Monarch, LRT/CTRS 11/04/2014 4:29 PM

## 2014-11-04 NOTE — Progress Notes (Signed)
Patient has been alert and oriented today. Affect has been blunted but he does brighten up and smile upon approach. Patient has good eye contact. He has been medication and group compliant. Denies suicidal or homicidal thoughts. Denies auditory or visual hallucinations Q 15 min checks in place. Will cont to monitor for safety.

## 2014-11-04 NOTE — BHH Group Notes (Signed)
[facility] Group Notes:  (Nursing/MHT/Case Management/Adjunct)  Date:  11/04/2014  Time:  1:42 PM  Type of Therapy:  Psychoeducational Skills  Participation Level:  Minimal  Participation Quality:  Appropriate and Inattentive  Affect:  Appropriate  Cognitive:  Appropriate and Lacking  Insight:  Appropriate  Engagement in Group:  Lacking and Limited  Modes of Intervention:  Discussion and Education  Summary of Progress/Problems:  Ian Cook 11/04/2014, 1:42 PM

## 2014-11-04 NOTE — Progress Notes (Signed)
[facility] MD Progress Note  11/04/2014 7:27 AM Ian Cook  MRN:  [id number] Subjective:  Patient is a 54 year old male who was seen for follow-up. He reported that he has started feeling better and occasionally has AH calling out different names. He stated that he has good appetite, no change in weight.  He reported that he has been attending groups on a regular basis. He is not having any adverse effects of his medication. He currently denied having any suicidal or homicidal ideations or plans.  He appeared pleasant and cooperative at this time Denies having any physical complaints this morning. Per nursing there were no events overnight.      Principal Problem: Schizoaffective disorder, manic type Diagnosis:   Patient Active Problem List   Diagnosis Date Noted  . HTN (hypertension) [I10] 11/01/2014  . Tobacco use disorder [Z72.0] 11/01/2014  . Schizoaffective disorder, manic type [F25.0] 10/30/2014   Total Time spent with patient: 15 minutes   Past Medical History:  Past Medical History  Diagnosis Date  . Hypertension   . Schizo-affective schizophrenia    History reviewed. No pertinent past surgical history. Family History: History reviewed. No pertinent family history. Social History:  History  Alcohol Use No     History  Drug Use No    History   Social History  . Marital Status: Married    Spouse Name: N/A  . Number of Children: N/A  . Years of Education: N/A   Social History Main Topics  . Smoking status: Current Every Day Smoker    Types: Cigarettes  . Smokeless tobacco: Not on file  . Alcohol Use: No  . Drug Use: No  . Sexual Activity: Not on file   Other Topics Concern  . None   Social History Narrative   Additional History:    Sleep: Good  Appetite:  Fair   Assessment:   Musculoskeletal: Strength & Muscle Tone: within normal limits Gait & Station: normal Patient leans: N/A   Psychiatric Specialty Exam: Physical Exam   Review of Systems   Constitutional: Negative for weight loss.  HENT: Negative for hearing loss and nosebleeds.   Eyes: Negative for pain.  Respiratory: Negative for hemoptysis.   Cardiovascular: Negative for orthopnea.  Gastrointestinal: Negative for nausea and constipation.  Genitourinary: Negative for frequency.  Musculoskeletal: Negative for myalgias.  Skin: Negative for itching.  Neurological: Negative for dizziness and tremors.  Psychiatric/Behavioral: Positive for hallucinations. Negative for depression and substance abuse. The patient is not nervous/anxious and does not have insomnia.     Blood pressure 149/98, pulse 76, temperature 98.4 F (36.9 C), temperature source Oral, resp. rate 20, height 6\' 3"  (1.905 m), weight 71.215 kg (157 lb).Body mass index is 19.62 kg/(m^2).  General Appearance: Fairly Groomed  Engineer, water::  Good  Speech:  Normal Rate  Volume:  Normal  Mood:  Euthymic  Affect:  Congruent  Thought Process:  Tangential  Orientation:  Full (Time, Place, and Person)  Thought Content:  Hallucinations: None  Suicidal Thoughts:  No  Homicidal Thoughts:  No  Memory:  Immediate;   Good Recent;   Good Remote;   Good  Judgement:  Impaired  Insight:  Lacking  Psychomotor Activity:  Increased  Concentration:  Fair  Recall:  NA  Fund of Knowledge:Fair  Language: Good  Akathisia:  No  Handed:    AIMS (if indicated):     Assets:  Financial Resources/Insurance Housing Social Support  ADL's:  Intact  Cognition: WNL  Sleep:  Number  of Hours: 6.45     Current Medications: Current Facility-Administered Medications  Medication Dose Route Frequency Provider Last Rate Last Dose  . acetaminophen (TYLENOL) tablet 650 mg  650 mg Oral Q6H PRN Gonzella Lex, MD      . alum & mag hydroxide-simeth (MAALOX/MYLANTA) 200-200-20 MG/5ML suspension 30 mL  30 mL Oral Q4H PRN Gonzella Lex, MD      . hydrALAZINE (APRESOLINE) tablet 25 mg  25 mg Oral Q8H PRN Hildred Priest, MD      .  ibuprofen (ADVIL,MOTRIN) tablet 600 mg  600 mg Oral Q6H PRN Hildred Priest, MD      . lisinopril (PRINIVIL,ZESTRIL) tablet 10 mg  10 mg Oral Daily Hildred Priest, MD   10 mg at 11/03/14 0902  . magnesium hydroxide (MILK OF MAGNESIA) suspension 30 mL  30 mL Oral Daily PRN Gonzella Lex, MD      . nicotine (NICOTROL) 10 MG inhaler 1 continuous puffing  1 continuous puffing Inhalation PRN Hildred Priest, MD   1 continuous puffing at 11/01/14 1225  . OLANZapine (ZYPREXA) tablet 15 mg  15 mg Oral QHS Hildred Priest, MD   15 mg at 11/03/14 2117  . traZODone (DESYREL) tablet 100 mg  100 mg Oral QHS Hildred Priest, MD   100 mg at 11/03/14 2117    Lab Results:  No results found for this or any previous visit (from the past 48 hour(s)).  Physical Findings: AIMS: Facial and Oral Movements Muscles of Facial Expression: None, normal Lips and Perioral Area: None, normal Jaw: None, normal Tongue: None, normal,Extremity Movements Upper (arms, wrists, hands, fingers): None, normal Lower (legs, knees, ankles, toes): None, normal, Trunk Movements Neck, shoulders, hips: None, normal, Overall Severity Severity of abnormal movements (highest score from questions above): None, normal Incapacitation due to abnormal movements: None, normal Patient's awareness of abnormal movements (rate only patient's report): No Awareness, Dental Status Current problems with teeth and/or dentures?: No Does patient usually wear dentures?: No  CIWA:    COWS:     Treatment Plan Summary: Daily contact with patient to assess and evaluate symptoms and progress in treatment and Medication management   Schizoaffective disorder: Patient will be on Zyprexa 15 mg by mouth daily at bedtime.  Hypertension: Continue lisinopril 10 mg by mouth daily. Blood pressure and heart rate are within normal limits today. Continue low sodium diet.  For now continue orders for vital signs every 8  hours and hydralazine 25 mg every 8 hours when necessary however over the weekend his might be able to be discontinued if blood pressure continues to be normal.  For nicotine withdrawal: Patient will be started on Nicotrol inhaler  For insomnia the patient will be started on trazodone 100 mg by mouth daily at bedtime when necessary  For agitation start the patient on Ativan 2 mg every 8 hours as needed  Hospitalization status: Patient agrees to sign in as voluntary today  Precautions continue every 15 minute checks  Collateral information: We will need to obtain collateral information from the patient's nephew.   Medical Decision Making:  Established Problem, Stable/Improving (1)    Thank you for allowing me to participate in the care of this patient   This note was generated in part or whole with voice recognition software. Voice regonition is usually quite accurate but there are transcription errors that can and very often do occur. I apologize for any typographical errors that were not detected and corrected.  Rainey Pines 11/04/2014, 7:27 AM

## 2014-11-04 NOTE — Progress Notes (Signed)
Patient continues to deny feeling depressed or anxious; denies any SI and contracts for safety; denies any hallucinations; no psychotic episodes observed or reported; has been pleasant and cooperative throughout the shift; no voiced complaints; continue to monitor.

## 2014-11-04 NOTE — Plan of Care (Signed)
Problem: Ineffective individual coping Goal: STG: Patient will remain free from self harm Outcome: Progressing Patient denies having any SI, and contracts for safety.

## 2014-11-05 ENCOUNTER — Encounter: Payer: Self-pay | Admitting: Internal Medicine

## 2014-11-05 MED ORDER — OLANZAPINE 15 MG PO TABS: 15.0000 mg | ORAL_TABLET | Freq: Every day | ORAL | Status: DC

## 2014-11-05 MED ORDER — LISINOPRIL 20 MG PO TABS: 20.0000 mg | ORAL_TABLET | Freq: Every day | ORAL | Status: DC

## 2014-11-05 MED ORDER — AMLODIPINE BESYLATE 5 MG PO TABS
5.0000 mg | ORAL_TABLET | Freq: Every day | ORAL | Status: DC
  Administered 2014-11-05 – 2014-11-06 (×2): 5 mg via ORAL
  Filled 2014-11-05 (×2): qty 1

## 2014-11-05 MED ORDER — NICOTINE 10 MG IN INHA: 1.0000 | RESPIRATORY_TRACT | Status: DC | PRN

## 2014-11-05 MED ORDER — AMLODIPINE BESYLATE 5 MG PO TABS: 5.0000 mg | ORAL_TABLET | Freq: Every day | ORAL | Status: DC

## 2014-11-05 MED ORDER — LISINOPRIL 20 MG PO TABS
20.0000 mg | ORAL_TABLET | Freq: Every day | ORAL | Status: DC
  Administered 2014-11-06: 20 mg via ORAL
  Filled 2014-11-05: qty 1

## 2014-11-05 MED ORDER — TRAZODONE HCL 100 MG PO TABS: 100.0000 mg | ORAL_TABLET | Freq: Every day | ORAL | Status: DC

## 2014-11-05 NOTE — Discharge Summary (Signed)
Physician Discharge Summary Note  Patient:  Ian Cook is an 54 y.o., male MRN:  [id number] DOB:  06-19-1960 Patient phone:  [phone] (home)  Patient address:   [place] [address],  Total Time spent with patient: 30 minutes  Date of Admission:  10/30/2014 Date of Discharge: 11/05/2014  Reason for Admission: Agitation, worsening psychosis, wandering outside the group home leading to missing a.m. medications  Principal Problem: Schizoaffective disorder, manic type Discharge Diagnoses: Patient Active Problem List   Diagnosis Date Noted  . HTN (hypertension) [I10] 11/01/2014  . Tobacco use disorder [Z72.0] 11/01/2014  . Schizoaffective disorder, manic type [F25.0] 10/30/2014    Musculoskeletal: Strength & Muscle Tone: within normal limits Gait & Station: normal Patient leans: N/A  Psychiatric Specialty Exam: Physical Exam  ROS  Blood pressure 157/109, pulse 82, temperature 98 F (36.7 C), temperature source Oral, resp. rate 20, height _0  (1.905 m), weight 71.215 kg (157 lb).Body mass index is 19.62 kg/(m^2).  General Appearance: Fairly Groomed  Engineer, water::  Good  Speech:  Normal Rate  Volume:  Normal  Mood:  Euthymic  Affect:  Congruent  Thought Process:  Linear  Orientation:  Full (Time, Place, and Person)  Thought Content:  Hallucinations: None  Suicidal Thoughts:  No  Homicidal Thoughts:  No  Memory:  Immediate;   Fair Recent;   Fair Remote;   Fair  Judgement:  Fair  Insight:  Fair  Psychomotor Activity:  Normal  Concentration:  Fair  Recall:  NA  Fund of Knowledge:Fair  Language: Good  Akathisia:  No  Handed:    AIMS (if indicated):     Assets:  Financial Resources/Insurance Housing Social Support  ADL's:  Intact  Cognition: WNL  Sleep:  Number of Hours: 7.45   Have you used any form of tobacco in the last 30 days? (Cigarettes, Smokeless Tobacco, Cigars, and/or Pipes): Yes  Has this patient used any form of tobacco in the last  30 days? (Cigarettes, Smokeless Tobacco, Cigars, and/or Pipes) Yes, A prescription for an FDA-approved tobacco cessation medication was offered at discharge and the patient refused  History of Present Illness: Ian Cook is a 54 y.o. male with history of schizoaffective disorder and hypertension presented to our emergency department on May 24 for evaluation of manic behavior, hallucinations. Patient lives in a Group home who is owned by his nephew. His nephew is also the patient's healthcare power of attorney. [name] reported that the patient's symptoms have worsened. He is now walking up to 20 miles away from his group home though he is not supposed to leave the group home. As he has been living in the facility sometimes he misses his morning medication. He is occasionally aggressive to other group home members as well. His nephew felt that the patient needs a medication adjustment.   The patient himself admits that he's been wandering away from the group home. 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 Denies getting into any fights. No specific stress. He says his mood is been very good. Denies having any major issues with appetite, energy or concentration. He stated that sometimes at the group home he doesn't sleep very well. He denies having suicidality or homicidality. As far as auditory hallucinations he reports hearing voices but he couldn't tell me what the voices are saying but he denies that the voices tell him to hurt himself or  others. Patient states he has been compliant with all his medications. Denies major side effects or another that sometimes he feels that his heart flutters. He does not know the name of his medications or her might be prescribing them.  He denies the use of alcohol or illicit substances. He stated that he smokes about 10 cigarettes per day.  Elements: Severity: Severe. Timing:  Chronic with acute exacerbation. Duration: Unclear. Context: Poor compliance. Associated Signs/Symptoms: Depression Symptoms: none (Hypo) Manic Symptoms: Distractibility, Elevated Mood, Hallucinations, Impulsivity, Labiality of Mood, Anxiety Symptoms: none Psychotic Symptoms: Hallucinations: Auditory PTSD Symptoms: NA Total Time spent with patient: 1 hour   Past psychiatric history: Patient with a long history of schizophrenia. He used to see Dr. Kathreen Cosier at [facility]. In the past Zyprexa was helpful for him. As far as prior history of suicidal attempts, he reported overdosing in 1983 on his sleeping medications. Looks like his only current medication is Latuda 40 mg daily  Past Medical History:  Past Medical History  Diagnosis Date  . Hypertension   . Schizo-affective schizophrenia    History reviewed. No pertinent past surgical history.   Family History: History reviewed. No pertinent family history. patient states that his mother had some mental health problems after losing child at the age of 29. Patient had another older sister who died 61 years ago.  Social History: Patient's nephew is his closest relative. His nephew is the proprietor of local group homes and looks after his uncle. Patient obviously is unable to work. He says that he feels like he doesn't get enough money and so he is going out trying to earn some more recently which is unusual for him. History  Alcohol Use No    History  Drug Use No    History   Social History  . Marital Status: Married    Spouse Name: N/A  . Number of Children: N/A  . Years of Education: N/A   Social History Main Topics  . Smoking status: Current Every Day Smoker    Types: Cigarettes  . Smokeless tobacco: Not on file  . Alcohol Use: No  . Drug Use: No  . Sexual Activity: Not on file   Other Topics Concern  . None        Level of Care:   OP  Hospital Course:    Schizoaffective disorder: [name] was discontinued due to lack of effectiveness. Patient was started on Zyprexa 15 mg by mouth daily at bedtime.  This medication was chosen as he had tried in the past with good response. Patient tolerated Zyprexa well with no side effects.  Hemoglobin A1c was 5.3.  As olanzapine has high risk for metabolic syndrome consider adding metformin 500 mg by mouth twice a day for metabolic syndrome prevention.  Hypertension: Patient's blood pressure was elevated throughout his hospitalization. He wasn't started on lisinopril.  And eventually an internal medicine consult was requested as his blood pressure still was not well controlled. Norvasc was added to the regimen.  Hydralazine 25 mg every 8 hours was used as needed when blood pressure was elevated.  For nicotine withdrawal: Patient will be started on Nicotrol inhaler  For insomnia the patient will be started on trazodone 100 mg by mouth daily at bedtime when necessary  Collateral information: Was unable to contact the patient's nephew who is his healthcare power of attorney.   On the day of the discharge the patient reported feeling well. Denied suicidality, homicidality or having auditory or visual hallucinations.  He denied having any side effects from his medications. He denied having problems with energy, appetite or concentration.  Patient denied having any physical complaints. This hospitalization was uneventful. The patient was calm pleasant and cooperative. He did not have any episodes of agitation. He did not require seclusion, restraints or forced medications. Patient participated in programming.  Consults:  Internal medicine  Significant Diagnostic Studies:  None  Discharge Vitals:   Blood pressure 157/109, pulse 82, temperature 98 F (36.7 C), temperature source Oral, resp. rate 20, height _0  (1.905 m), weight 71.215 kg (157 lb). Body mass index is 19.62 kg/(m^2).  Lab  Results:   Results for CARL, BUTNER (MRN [id number]) as of 11/05/2014 14:34  Ref. Range 10/29/2014 16:48 11/01/2014 06:46  Sodium Latest Ref Range: 135-145 mmol/L 136   Potassium Latest Ref Range: 3.5-5.1 mmol/L 3.9   Chloride Latest Ref Range: 101-111 mmol/L 101   CO2 Latest Ref Range: 22-32 mmol/L 29   BUN Latest Ref Range: 6-20 mg/dL 27 (H)   Creatinine Latest Ref Range: 0.61-1.24 mg/dL 1.42 (H)   Calcium Latest Ref Range: 8.9-10.3 mg/dL 8.4 (L)   EGFR (Non-African Amer.) Latest Ref Range: >60 mL/min 54 (L)   EGFR (African American) Latest Ref Range: >60 mL/min >60   Glucose Latest Ref Range: 65-99 mg/dL 67   Anion gap Latest Ref Range: 5-15  6   Alkaline Phosphatase Latest Ref Range: 38-126 U/L 59   Albumin Latest Ref Range: 3.5-5.0 g/dL 4.5   AST Latest Ref Range: 15-41 U/L 29   ALT Latest Ref Range: 17-63 U/L 18   Total Protein Latest Ref Range: 6.5-8.1 g/dL 7.1   Total Bilirubin Latest Ref Range: 0.3-1.2 mg/dL 0.5   Cholesterol Latest Ref Range: 0-200 mg/dL  153  Triglycerides Latest Ref Range: <150 mg/dL  54  HDL Cholesterol Latest Ref Range: >40 mg/dL  67  LDL (calc) Latest Ref Range: 0-99 mg/dL  75  VLDL Latest Ref Range: 0-40 mg/dL  11  Total CHOL/HDL Ratio Latest Units: RATIO  2.3  WBC Latest Ref Range: 3.8-10.6 K/uL 6.8   RBC Latest Ref Range: 4.40-5.90 MIL/uL 4.37 (L)   Hemoglobin Latest Ref Range: 13.0-18.0 g/dL 13.8   HCT Latest Ref Range: 40.0-52.0 % 41.2   MCV Latest Ref Range: 80.0-100.0 fL 94.2   MCH Latest Ref Range: 26.0-34.0 pg 31.6   MCHC Latest Ref Range: 32.0-36.0 g/dL 33.5   RDW Latest Ref Range: 11.5-14.5 % 13.4   Platelets Latest Ref Range: 150-440 K/uL 183   Neutrophils Latest Units: % 60   Lymphocytes Latest Units: % 30   Monocytes Relative Latest Units: % 7   Eosinophil Latest Units: % 2   Basophil Latest Units: % 1   NEUT# Latest Ref Range: 1.4-6.5 K/uL 4.2   Lymphocyte # Latest Ref Range: 1.0-3.6 K/uL 2.0   Monocyte # Latest Ref Range: 0.2-1.0  K/uL 0.5   Eosinophils Absolute Latest Ref Range: 0-0.7 K/uL 0.1   Basophils Absolute Latest Ref Range: 0-0.1 K/uL 0.0   Hemoglobin A1C Latest Ref Range: 4.0-6.0 %  5.3  TSH Latest Ref Range: 0.350-4.500 uIU/mL  0.749  Appearance Latest Ref Range: CLEAR  CLEAR (A)   Bacteria, UA Latest Ref Range: NONE SEEN  NONE SEEN   Bilirubin Urine Latest Ref Range: NEGATIVE  NEGATIVE   Color, Urine Latest Ref Range: YELLOW  STRAW (A)   Glucose Latest Ref Range: NEGATIVE mg/dL NEGATIVE   Hgb urine dipstick Latest Ref Range: NEGATIVE  2+ (A)  Ketones, ur Latest Ref Range: NEGATIVE mg/dL NEGATIVE   Leukocytes, UA Latest Ref Range: NEGATIVE  NEGATIVE   Nitrite Latest Ref Range: NEGATIVE  NEGATIVE   pH Latest Ref Range: 5.0-8.0  6.0   Protein Latest Ref Range: NEGATIVE mg/dL NEGATIVE   RBC / HPF Latest Ref Range: 0-5 RBC/hpf 0-5   Specific Gravity, Urine Latest Ref Range: 1.005-1.030  1.010   Squamous Epithelial / LPF Latest Ref Range: NONE SEEN  NONE SEEN   WBC, UA Latest Ref Range: 0-5 WBC/hpf NONE SEEN   Alcohol, Ethyl (B) Latest Ref Range: <5 mg/dL <5   Amphetamines, Ur Screen Latest Ref Range: NONE DETECTED  NONE DETECTED   Barbiturates, Ur Screen Latest Ref Range: NONE DETECTED  NONE DETECTED   Benzodiazepine, Ur Scrn Latest Ref Range: NONE DETECTED  NONE DETECTED   Cocaine Metabolite,Ur [place] Latest Ref Range: NONE DETECTED  NONE DETECTED   Methadone Scn, Ur Latest Ref Range: NONE DETECTED  NONE DETECTED   MDMA (Ecstasy)Ur Screen Latest Ref Range: NONE DETECTED  NONE DETECTED   Cannabinoid 50 Ng, Ur [place] Latest Ref Range: NONE DETECTED  NONE DETECTED   Opiate, Ur Screen Latest Ref Range: NONE DETECTED  NONE DETECTED   Phencyclidine (PCP) Ur S Latest Ref Range: NONE DETECTED  NONE DETECTED   Tricyclic, Ur Screen Latest Ref Range: NONE DETECTED  NONE DETECTED    Physical Findings: AIMS: Facial and Oral Movements Muscles of Facial Expression: None, normal Lips and Perioral Area: None, normal Jaw:  None, normal Tongue: None, normal,Extremity Movements Upper (arms, wrists, hands, fingers): None, normal Lower (legs, knees, ankles, toes): None, normal, Trunk Movements Neck, shoulders, hips: None, normal, Overall Severity Severity of abnormal movements (highest score from questions above): None, normal Incapacitation due to abnormal movements: None, normal Patient's awareness of abnormal movements (rate only patient's report): No Awareness, Dental Status Current problems with teeth and/or dentures?: No Does patient usually wear dentures?: No  CIWA:    COWS:      See Psychiatric Specialty Exam and Suicide Risk Assessment completed by Attending Physician prior to discharge.  Discharge destination: Group Home  Is patient on multiple antipsychotic therapies at discharge:  No   Has Patient had three or more failed trials of antipsychotic monotherapy by history:  No    Recommended Plan for Multiple Antipsychotic Therapies: NA  Discharge Instructions    Diet - low sodium heart healthy    Complete by:  As directed      Increase activity slowly    Complete by:  As directed             Medication List    STOP taking these medications        lurasidone 40 MG Tabs tablet  Commonly known as:  LATUDA      TAKE these medications      Indication   amLODipine 5 MG tablet  Commonly known as:  NORVASC  Take 1 tablet (5 mg total) by mouth daily.  Notes to Patient:  HTN      lisinopril 20 MG tablet  Commonly known as:  PRINIVIL,ZESTRIL  Take 1 tablet (20 mg total) by mouth daily.  Start taking on:  11/06/2014  Notes to Patient:  HTN      nicotine 10 MG inhaler  Commonly known as:  NICOTROL  Inhale 1 cartridge (1 continuous puffing total) into the lungs as needed for smoking cessation.  Notes to Patient:  Nicotine craving      OLANZapine  15 MG tablet  Commonly known as:  ZYPREXA  Take 1 tablet (15 mg total) by mouth at bedtime.  Notes to Patient:  psychosis      traZODone  100 MG tablet  Commonly known as:  DESYREL  Take 1 tablet (100 mg total) by mouth at bedtime.  Notes to Patient:  insomnia            Follow-up Information    Follow up with [facility]. Go in 1 week.   Why:  Hospital Follow up, Outpatient Medication Management, Therapy, Walk-ins Monday, Wednesday, Friday between 8am-3pm   Contact information:   [place] [address] [phone]; [phone]      Follow up with [facility].   Contact information:   [phone]      Total Discharge Time: 30 minutes  Signed: Hildred Priest 11/05/2014, 2:34 PM

## 2014-11-05 NOTE — Plan of Care (Signed)
Problem: [facility] Participation in Recreation Therapeutic Interventions Goal: STG-Other Recreation Therapy Goal (Specify) STG: Self-Expression - Within 3 treatment sessions, patient will participate in self-expression activity in one treatment session to increase self-expression post d/c.  Outcome: Completed/Met Date Met:  11/05/14 Treatment Session 1; Completed 1 out of 1: At approximately 3:05 pm on 05.30.16, patient participated in self-expression activity in Recreational Therapy Group.  Leonette Monarch, LRT/CTRS 05.31.16 11:34 am

## 2014-11-05 NOTE — Progress Notes (Signed)
Pleasant and cooperative.  No inappropriate behavior noted.

## 2014-11-05 NOTE — [facility] Suicide Risk Assessment (Signed)
[facility] Discharge Suicide Risk Assessment   Demographic Factors:  Male  Total Time spent with patient: 30 minutes   Psychiatric Specialty Exam: Physical Exam  ROS  Blood pressure 157/109, pulse 82, temperature 98 F (36.7 C), temperature source Oral, resp. rate 20, height 6\' 3"  (1.905 m), weight 71.215 kg (157 lb).Body mass index is 19.62 kg/(m^2).                                                       Have you used any form of tobacco in the last 30 days? (Cigarettes, Smokeless Tobacco, Cigars, and/or Pipes): Yes  Has this patient used any form of tobacco in the last 30 days? (Cigarettes, Smokeless Tobacco, Cigars, and/or Pipes) Yes, A prescription for an FDA-approved tobacco cessation medication was offered at discharge and the patient refused  Mental Status Per Nursing Assessment::   On Admission:  NA  Current Mental Status by Physician: Denies suicidality, homicidality or having auditory or visual hallucinations. Patient is euthymic. Future oriented and hopeful  Loss Factors: none  Historical Factors: none  Risk Reduction Factors:   Sense of responsibility to family, Living with another person, especially a relative and Positive social support  Continued Clinical Symptoms:  Schizophrenia:   Paranoid or undifferentiated type  Cognitive Features That Contribute To Risk:  None    Suicide Risk:  Minimal: No identifiable suicidal ideation.  Patients presenting with no risk factors but with morbid ruminations; may be classified as minimal risk based on the severity of the depressive symptoms  Principal Problem: Schizoaffective disorder, manic type Discharge Diagnoses:  Patient Active Problem List   Diagnosis Date Noted  . HTN (hypertension) [I10] 11/01/2014  . Tobacco use disorder [Z72.0] 11/01/2014  . Schizoaffective disorder, manic type [F25.0] 10/30/2014      Plan Of Care/Follow-up recommendations:  Other:  Follow-up with outpatient  psychiatrist  Is patient on multiple antipsychotic therapies at discharge:  No   Has Patient had three or more failed trials of antipsychotic monotherapy by history:  No  Recommended Plan for Multiple Antipsychotic Therapies: NA    Hildred Priest 11/05/2014, 11:00 AM

## 2014-11-05 NOTE — [facility] Group Notes (Signed)
[facility] Group Notes:  (Nursing/MHT/Case Management/Adjunct)  Date:  11/05/2014  Time:  1:59 PM  Type of Therapy:  Group Therapy  Participation Level:  Minimal  Participation Quality:  Appropriate  Affect:  Appropriate  Cognitive:  Appropriate  Insight:  Appropriate  Engagement in Group:  Supportive  Modes of Intervention:  Discussion  Summary of Progress/Problems:  Ian Cook 11/05/2014, 1:59 PM

## 2014-11-05 NOTE — [facility] Group Notes (Signed)
[facility] LCSW Group Therapy  11/05/2014 4:07 PM  Type of Therapy:  Group Therapy  Participation Level:  Active  Participation Quality:  Appropriate and Attentive  Affect:  Appropriate  Cognitive:  Alert and Appropriate  Insight:  Engaged  Engagement in Therapy:  Engaged  Modes of Intervention:  Socialization and Support  Summary of Progress/Problems: Patient attended and participated in group discussion appropriately. Patient left group 15 minutes early.    Carmell Austria T 11/05/2014, 4:07 PM

## 2014-11-05 NOTE — Plan of Care (Signed)
Problem: Ineffective individual coping Goal: STG: Patient will remain free from self harm Outcome: Progressing Denies SI.

## 2014-11-05 NOTE — Progress Notes (Signed)
Consult called to internal medicine for uncontrolled HTN.

## 2014-11-05 NOTE — Progress Notes (Signed)
Stran pleasant and cooperative.Isolates to room.Minimal interaction with peers.Med compliant.Good thoughts.Will continue to assess frequently.

## 2014-11-05 NOTE — Progress Notes (Signed)
Recreation Therapy Notes  INPATIENT RECREATION TR PLAN  Patient Details Name: Ian Cook MRN: [id number] DOB: 05/26/1961 Today's Date: 11/05/2014  Rec Therapy Plan Is patient appropriate for Therapeutic Recreation?: Yes Treatment times per week: At least once a week TR Treatment/Interventions: 1:1 session, Group participation (Comment) (Appropriate participation in daily recreation therapy tx)  Discharge Criteria Pt will be discharged from therapy if:: Discharged Treatment plan/goals/alternatives discussed and agreed upon by:: Patient/family  Discharge Summary Short term goals set: See Care Plan Short term goals met: Complete Progress toward goals comments: Groups attended Which groups?: Other (Comment), Communication, Social skills, Leisure education Water engineer) Reason goals not met: N/A Therapeutic equipment acquired: None Reason patient discharged from therapy: Discharge from hospital Pt/family agrees with progress & goals achieved: Yes Date patient discharged from therapy: 11/05/14   Leonette Monarch, LRT/CTRS 11/05/2014, 11:36 AM

## 2014-11-05 NOTE — Consult Note (Signed)
[facility] [place] NAME: Ian Cook    MR#:  [id number]  DATE OF BIRTH:  April 26, 1961  DATE OF ADMISSION:  10/30/2014  PRIMARY CARE PHYSICIAN: No primary care provider on file.   REQUESTING/REFERRING PHYSICIAN: Dr. Jerilee Hoh  CHIEF COMPLAINT:  Worsening maniac symptoms and auditory hallucinations  HISTORY OF PRESENT ILLNESS:  Ian Cook  is a 54 y.o. male with a known history of hypertension and schizophrenia from a group home was brought in secondary to worsening auditory hallucinations. He is admitted to behavioral medicine unit, medications are being adjusted. He says his hallucinations are getting better and his head is more clear now. He has had hypertension as an outpatient and was taking lisinopril. Blood pressure in the hospital has been elevated. Medical consult requested for management of hypertension. Denies any headache or chest pain.  PAST MEDICAL HISTORY:   Past Medical History  Diagnosis Date  . Hypertension   . Schizo-affective schizophrenia     PAST SURGICAL HISTOIRY:   Past Surgical History  Procedure Laterality Date  . Tonsillectomy      SOCIAL HISTORY:   History  Substance Use Topics  . Smoking status: Current Every Day Smoker -- 0.50 packs/day    Types: Cigarettes  . Smokeless tobacco: Not on file  . Alcohol Use: No  Lives in the family care home  FAMILY HISTORY:   Family History  Problem Relation Age of Onset  . Aneurysm Mother   . CAD Father     DRUG ALLERGIES:  No Known Allergies  REVIEW OF SYSTEMS:   ROS  CONSTITUTIONAL: No fever, fatigue or weakness.  EYES: No blurred or double vision.  EARS, NOSE, AND THROAT: No tinnitus or ear pain.  RESPIRATORY: No cough, shortness of breath, wheezing or hemoptysis.  CARDIOVASCULAR: No chest pain, orthopnea, edema.  GASTROINTESTINAL: No nausea, vomiting, diarrhea or abdominal pain.  GENITOURINARY: No dysuria, hematuria.  ENDOCRINE: No  polyuria, nocturia,  HEMATOLOGY: No anemia, easy bruising or bleeding SKIN: No rash or lesion. MUSCULOSKELETAL: No joint pain or arthritis.   NEUROLOGIC: No tingling, numbness, weakness.  PSYCHIATRY: No anxiety or depression.   MEDICATIONS AT HOME:   Prior to Admission medications   Medication Sig Start Date End Date Taking? Authorizing Provider  amLODipine (NORVASC) 5 MG tablet Take 1 tablet (5 mg total) by mouth daily. 11/05/14   Hildred Priest, MD  lisinopril (PRINIVIL,ZESTRIL) 20 MG tablet Take 1 tablet (20 mg total) by mouth daily. 11/06/14   Hildred Priest, MD  lurasidone (LATUDA) 40 MG TABS tablet Take 40 mg by mouth daily with breakfast.    Historical Provider, MD  nicotine (NICOTROL) 10 MG inhaler Inhale 1 cartridge (1 continuous puffing total) into the lungs as needed for smoking cessation. 11/05/14   Hildred Priest, MD  OLANZapine (ZYPREXA) 15 MG tablet Take 1 tablet (15 mg total) by mouth at bedtime. 11/05/14   Hildred Priest, MD  traZODone (DESYREL) 100 MG tablet Take 1 tablet (100 mg total) by mouth at bedtime. 11/05/14   Hildred Priest, MD      VITAL SIGNS:  Blood pressure 157/109, pulse 82, temperature 98 F (36.7 C), temperature source Oral, resp. rate 20, height 6\' 3"  (1.905 m), weight 71.215 kg (157 lb).  PHYSICAL EXAMINATION:   Physical Exam  GENERAL:  54 y.o.-year-old patient lying in the bed with no acute distress.  EYES: Pupils equal, round, reactive to light and accommodation. No scleral icterus. Extraocular muscles intact.  HEENT: Head  atraumatic, normocephalic. Oropharynx and nasopharynx clear.  NECK:  Supple, no jugular venous distention. No thyroid enlargement, no tenderness.  LUNGS: Normal breath sounds bilaterally, no wheezing, rales,rhonchi or crepitation. No use of accessory muscles of respiration.  CARDIOVASCULAR: S1, S2 normal. No murmurs, rubs, or gallops.  ABDOMEN: Soft, nontender, nondistended.  Bowel sounds present. No organomegaly or mass.  EXTREMITIES: No pedal edema, cyanosis, or clubbing.  NEUROLOGIC: Cranial nerves II through XII are intact. Muscle strength 5/5 in all extremities. Sensation intact. Gait not checked.  PSYCHIATRIC: The patient is alert and oriented x 3. Some confusion intermittently SKIN: No obvious rash, lesion, or ulcer.   LABORATORY PANEL:   CBC  Recent Labs Lab 10/29/14 1648  WBC 6.8  HGB 13.8  HCT 41.2  PLT 183   ------------------------------------------------------------------------------------------------------------------  Chemistries   Recent Labs Lab 10/29/14 1648  NA 136  K 3.9  CL 101  CO2 29  GLUCOSE 67  BUN 27*  CREATININE 1.42*  CALCIUM 8.4*  AST 29  ALT 18  ALKPHOS 59  BILITOT 0.5   ------------------------------------------------------------------------------------------------------------------  Cardiac Enzymes No results for input(s): TROPONINI in the last 168 hours. ------------------------------------------------------------------------------------------------------------------  RADIOLOGY:  No results found.  EKG:  No orders found for this or any previous visit.  IMPRESSION AND PLAN:   Ian Cook  is a 54 y.o. male with a known history of hypertension and schizophrenia from a group home was brought in secondary to worsening auditory hallucinations. Medical consult requested for management of hypertension.  #1 hypertension-adjusted lisinopril dose. Also added Norvasc. Patient is on when necessary hydralazine. Continue to monitor blood pressure. Low-salt diet advised. Check his renal function, especially with his lisinopril.  #2 acute renal failure-likely prerenal. Encouraged to drink plenty of fluids. Recheck in the morning.  #3 schizophrenia with worsening auditory hallucinations-management per psychiatric. And Zyprexa.  #4 insomnia-continue trazodone.  Continue to follow. Thank you for the  consult.  All the records are reviewed and case discussed with Consulting provider. Management plans discussed with the patient, family and they are in agreement.  CODE STATUS: FULL CODE  TOTAL TIME TAKING CARE OF THIS PATIENT: 50 minutes.    Gladstone Lighter M.D on 11/05/2014 at 3:07 PM  Between 7am to 6pm - Pager - [phone]  After 6pm go to [email or web address] - password [email or web address] [facility]  Office  [phone]  CC: Primary care Physician: No primary care provider on file.

## 2014-11-05 NOTE — Progress Notes (Signed)
Recreation Therapy Notes  Date: 05.31.16 Time: 3:00 pm Location: Craft Room  Group Topic: Goal Setting  Goal Area(s) Addresses:  Patient will be able to identify one supportive statement per peer in group. Patient will verbalize benefit of setting goals. Patient will verbalize benefit of supporting peers. Patient will identify benefit of feeling supported.  Behavioral Response: Attentive, Interactive, Left early  Intervention: Step By Step  Activity: Patients were given a worksheet of a foot and instructed to list their goal inside the foot. Patients passed their worksheets around and peers wrote encouraging advice on the outside of the foot.  Education: LRT educated patients on goal setting and why it is important.  Education Outcome: Acknowledges education/In group clarification offered  Clinical Observations/Feedback: Patient wrote goal on his worksheet and encouraging words on peer's papers. Patient did not contribute to group discussion. Patient left group at approximately 3:35 pm. Patient did not return to group.  Leonette Monarch, LRT/CTRS 11/05/2014 4:15 PM

## 2014-11-06 LAB — BASIC METABOLIC PANEL
Anion gap: 7 (ref 5–15)
BUN: 14 mg/dL (ref 6–20)
CO2: 27 mmol/L (ref 22–32)
Calcium: 8.9 mg/dL (ref 8.9–10.3)
Chloride: 103 mmol/L (ref 101–111)
Creatinine, Ser: 1.15 mg/dL (ref 0.61–1.24)
GFR calc non Af Amer: >60 mL/min (ref >60)
Glucose, Bld: 181 mg/dL — ABNORMAL HIGH (ref 65–99)
Potassium: 4.2 mmol/L (ref 3.5–5.1)
Sodium: 137 mmol/L (ref 135–145)

## 2014-11-06 MED ORDER — TRAZODONE HCL 100 MG PO TABS: 100.0000 mg | ORAL_TABLET | Freq: Every day | ORAL | Status: DC

## 2014-11-06 MED ORDER — OLANZAPINE 15 MG PO TABS: 15.0000 mg | ORAL_TABLET | Freq: Every day | ORAL | Status: DC

## 2014-11-06 MED ORDER — AMLODIPINE BESYLATE 5 MG PO TABS: 5.0000 mg | ORAL_TABLET | Freq: Every day | ORAL | Status: DC

## 2014-11-06 MED ORDER — NICOTINE 10 MG IN INHA: 1.0000 | RESPIRATORY_TRACT | Status: DC | PRN

## 2014-11-06 MED ORDER — LISINOPRIL 20 MG PO TABS: 20.0000 mg | ORAL_TABLET | Freq: Every day | ORAL | Status: DC

## 2014-11-06 NOTE — Progress Notes (Signed)
[facility] [place] NAME: Laithan Conchas    MR#:  [id number]  DATE OF BIRTH:  Jun 09, 1960  SUBJECTIVE:  CHIEF COMPLAINT:  No chief complaint on file. -patient doing very well today. Ambulating in the hallways today. -Much improving auditory hallucinations  REVIEW OF SYSTEMS:  Review of Systems  Constitutional: Negative for fever and chills.  Respiratory: Negative for cough, shortness of breath and wheezing.   Cardiovascular: Negative for chest pain and palpitations.  Gastrointestinal: Negative for nausea, vomiting, abdominal pain, diarrhea and constipation.  Genitourinary: Negative for dysuria.  Neurological: Negative for dizziness, seizures and headaches.    DRUG ALLERGIES:  No Known Allergies  VITALS:  Blood pressure 149/81, pulse 88, temperature 98.2 F (36.8 C), temperature source Oral, resp. rate 20, height 6\' 3"  (1.905 m), weight 71.215 kg (157 lb).  PHYSICAL EXAMINATION:  Physical Exam  GENERAL:  54 y.o.-year-old patient lying in the bed with no acute distress.  EYES: Pupils equal, round, reactive to light and accommodation. No scleral icterus. Extraocular muscles intact.  HEENT: Head atraumatic, normocephalic. Oropharynx and nasopharynx clear.  NECK:  Supple, no jugular venous distention. No thyroid enlargement, no tenderness.  LUNGS: Normal breath sounds bilaterally, no wheezing, rales,rhonchi or crepitation. No use of accessory muscles of respiration.  CARDIOVASCULAR: S1, S2 normal. No murmurs, rubs, or gallops.  ABDOMEN: Soft, nontender, nondistended. Bowel sounds present. No organomegaly or mass.  EXTREMITIES: No pedal edema, cyanosis, or clubbing.  NEUROLOGIC: Cranial nerves II through XII are intact. Muscle strength 5/5 in all extremities. Sensation intact. Gait not checked.  PSYCHIATRIC: The patient is alert and oriented x 3.  SKIN: No obvious rash, lesion, or ulcer.    LABORATORY PANEL:   CBC No results  for input(s): WBC, HGB, HCT, PLT in the last 168 hours. ------------------------------------------------------------------------------------------------------------------  Chemistries   Recent Labs Lab 11/06/14 0747  NA 137  K 4.2  CL 103  CO2 27  GLUCOSE 181*  BUN 14  CREATININE 1.15  CALCIUM 8.9   ------------------------------------------------------------------------------------------------------------------  Cardiac Enzymes No results for input(s): TROPONINI in the last 168 hours. ------------------------------------------------------------------------------------------------------------------  RADIOLOGY:  No results found.  EKG:  No orders found for this or any previous visit.  ASSESSMENT AND PLAN:   Makyle Eslick is a 54 y.o. male with a known history of hypertension and schizophrenia from a group home was brought in secondary to worsening auditory hallucinations. Medical consult requested for management of hypertension.  #1 hypertension-Better controlled on lisinopril and norvasc - Patient is on when necessary hydralazine.  -Continue to monitor blood pressure. Low-salt diet advised. - can increase norvasc as outpatient if needed  #2 Acute renal failure-likely prerenal. Encouraged to drink plenty of fluids. Improved renal function  #3 schizophrenia with worsening auditory hallucinations-management per psychiatric. And Zyprexa.  #4 insomnia-continue trazodone   All the records are reviewed and case discussed with Care Management/Social Workerr. Management plans discussed with the patient, family and they are in agreement.  CODE STATUS: Full code  TOTAL TIME TAKING CARE OF THIS PATIENT: 38 minutes.   POSSIBLE D/C today, DEPENDING ON CLINICAL CONDITION.   Gladstone Lighter M.D on 11/06/2014 at 3:29 PM  Between 7am to 6pm - Pager - [phone]  After 6pm go to [email or web address] - password [email or web address] [facility]  [place]  Office   [phone]  CC: Primary care physician; No primary care provider on file.

## 2014-11-06 NOTE — Progress Notes (Addendum)
Patient has been calm and cooperative with unit routine. Has not voiced any complaints. Was compliant with hs medications. No inappropriate behaviors noted. No visible s/s of distress noted.   07:18 BP 166/118. Hydralazine 25mg  administered at 06:59. Reported to oncoming staff for follow-up.

## 2014-11-06 NOTE — Progress Notes (Signed)
  [facility] Adult Case Management Discharge Plan :  Will you be returning to the same living situation after discharge:  Yes,  back to [place] At discharge, do you have transportation home?: Yes,  Group Home to pick up Do you have the ability to pay for your medications: Yes,  Medicare and Medicaid  Release of information consent forms completed and in the chart;  Patient's signature needed at discharge.  Patient to Follow up at: Follow-up Information    Follow up with [facility]. Go in 1 week.   Why:  Hospital Follow up, Outpatient Medication Management, Therapy, Walk-ins Monday, Wednesday, Friday between 8am-3pm   Contact information:   [place] [address] [phone]; [phone]      Follow up with [facility].   Contact information:   [phone]      Patient denies SI/HI: Yes,       Safety Planning and Suicide Prevention discussed: Yes,     Have you used any form of tobacco in the last 30 days? (Cigarettes, Smokeless Tobacco, Cigars, and/or Pipes): Yes  Has patient been referred to the Quitline?: Patient refused referral   August Saucer, LCSW 11/06/2014, 3:54 PM

## 2014-11-06 NOTE — Progress Notes (Signed)
Discharge instructions reviewed with [facility] and [place].  Both verbalized understanding.  Prescriptions and FL2 given to [facility].  Care of Kayon released to [facility] so they could travel back to the group home.

## 2014-11-06 NOTE — Progress Notes (Signed)
Pleasant and cooperative.  Medication and group compliant.  Anxiously awaiting for group home to come pick him up so that he can go home.

## 2014-11-06 NOTE — Progress Notes (Signed)
Recreation Therapy Notes  Date: 06.01.16 Time: 3:00 pm Location: Craft Room  Group Topic: Self-esteem  Goal Area(s) Addresses:  Patient will write at least one positive trait about self. Patient will list one benefit of having a good self-esteem.  Behavioral Response: Attentive, Left early  Intervention: I Am  Activity: Patients were given a worksheet with the letter I on it and instructed to list as many positive trait about themselves inside the letter.  Education: LRT educated patient on ways they can increase their self-esteem.  Education Outcome: Acknowledges education/In group clarification offered  Clinical Observations/Feedback: Patient completed activity by filling approximately 25% of letter. Patient left group at approximately 3:19 pm. Patient did not return to group.  Leonette Monarch, LRT/CTRS 11/06/2014 4:32 PM

## 2014-11-06 NOTE — [facility] Suicide Risk Assessment (Signed)
[facility] INPATIENT:  Family/Significant Other Suicide Prevention Education  Suicide Prevention Education:  Education Completed; [place] [name], Systems analyst,  (name of family member/significant other) has been identified by the patient as the family member/significant other with whom the patient will be residing, and identified as the person(s) who will aid the patient in the event of a mental health crisis (suicidal ideations/suicide attempt).  With written consent from the patient, the family member/significant other has been provided the following suicide prevention education, prior to the and/or following the discharge of the patient.  The suicide prevention education provided includes the following:  Suicide risk factors  Suicide prevention and interventions  National Suicide Hotline telephone number  [facility] assessment telephone number  [facility] Emergency Assistance [place] and/or Residential Mobile Crisis Unit telephone number  Request made of family/significant other to:  Remove weapons (e.g., guns, rifles, knives), all items previously/currently identified as safety concern.    Remove drugs/medications (over-the-counter, prescriptions, illicit drugs), all items previously/currently identified as a safety concern.  The family member/significant other verbalizes understanding of the suicide prevention education information provided.  The family member/significant other agrees to remove the items of safety concern listed above.  August Saucer, LCSW 11/06/2014, 3:53 PM

## 2014-11-06 NOTE — Plan of Care (Signed)
Problem: Alteration in thought process Goal: LTG-Patient has not harmed self or others in at least 2 days Outcome: Progressing Pt has not had any attempts to harm self or others.

## 2014-11-06 NOTE — Progress Notes (Addendum)
[facility] MD Progress Note  11/06/2014 4:08 PM Ian Cook  MRN:  [id number] Subjective:  Orders were given to discharge Ian Cook yesterday, but the patient did not leave the unit as there was a confusion with his paper work.  Today the patient denies suicidality, homicidality or having auditory or visual hallucinations. He denies having promiscuous with sleep, appetite, energy or concentration. The patient denies having any physical complaints or side effects from his medications..  Per nursing there were no behavioral issues overnight.    Principal Problem: Schizoaffective disorder, manic type Diagnosis:   Patient Active Problem List   Diagnosis Date Noted  . HTN (hypertension) [I10] 11/01/2014  . Tobacco use disorder [Z72.0] 11/01/2014  . Schizoaffective disorder, manic type [F25.0] 10/30/2014   Total Time spent with patient: 15 minutes   Past Medical History:  Past Medical History  Diagnosis Date  . Hypertension   . Schizo-affective schizophrenia     Past Surgical History  Procedure Laterality Date  . Tonsillectomy     Family History:  Family History  Problem Relation Age of Onset  . Aneurysm Mother   . CAD Father    Social History:  History  Alcohol Use No     History  Drug Use No    History   Social History  . Marital Status: Married    Spouse Name: N/A  . Number of Children: N/A  . Years of Education: N/A   Social History Main Topics  . Smoking status: Current Every Day Smoker -- 0.50 packs/day    Types: Cigarettes  . Smokeless tobacco: Not on file  . Alcohol Use: No  . Drug Use: No  . Sexual Activity: Not on file   Other Topics Concern  . None   Social History Narrative   From Family care home.   Additional History: Specify valuables returned: black belt, hat  Sleep: Good  Appetite:  Fair   Assessment:   Musculoskeletal: Strength & Muscle Tone: within normal limits Gait & Station: normal Patient leans: N/A   Psychiatric Specialty  Exam: Physical Exam   Review of Systems  Constitutional: Negative.   HENT: Negative.   Eyes: Negative.   Respiratory: Negative.   Cardiovascular: Negative.   Gastrointestinal: Negative.   Genitourinary: Negative.   Musculoskeletal: Negative.   Skin: Negative.   Endo/Heme/Allergies: Negative.   Psychiatric/Behavioral: Negative.     Blood pressure 149/81, pulse 88, temperature 98.2 F (36.8 C), temperature source Oral, resp. rate 20, height 6' 3"  (1.905 m), weight 71.215 kg (157 lb).Body mass index is 19.62 kg/(m^2).  General Appearance: Fairly Groomed  Engineer, water::  Good  Speech:  Normal Rate  Volume:  Normal  Mood:  Euthymic  Affect:  Congruent  Thought Process:  Tangential  Orientation:  Full (Time, Place, and Person)  Thought Content:  Hallucinations: None  Suicidal Thoughts:  No  Homicidal Thoughts:  No  Memory:  Immediate;   Good Recent;   Good Remote;   Good  Judgement:  Impaired  Insight:  Lacking  Psychomotor Activity:  Normal  Concentration:  Fair  Recall:  NA  Fund of Knowledge:Fair  Language: Good  Akathisia:  No  Handed:    AIMS (if indicated):     Assets:  Financial Resources/Insurance Housing Social Support  ADL's:  Intact  Cognition: WNL  Sleep:  Number of Hours: 7.5     Current Medications: Current Facility-Administered Medications  Medication Dose Route Frequency Provider Last Rate Last Dose  . acetaminophen (TYLENOL) tablet 650  mg  650 mg Oral Q6H PRN Gonzella Lex, MD      . alum & mag hydroxide-simeth (MAALOX/MYLANTA) 200-200-20 MG/5ML suspension 30 mL  30 mL Oral Q4H PRN Gonzella Lex, MD      . amLODipine (NORVASC) tablet 5 mg  5 mg Oral Daily Gladstone Lighter, MD   5 mg at 11/06/14 0913  . hydrALAZINE (APRESOLINE) tablet 25 mg  25 mg Oral Q8H PRN Hildred Priest, MD   25 mg at 11/06/14 0659  . ibuprofen (ADVIL,MOTRIN) tablet 600 mg  600 mg Oral Q6H PRN Hildred Priest, MD      . lisinopril (PRINIVIL,ZESTRIL)  tablet 20 mg  20 mg Oral Daily Gladstone Lighter, MD   20 mg at 11/06/14 0913  . magnesium hydroxide (MILK OF MAGNESIA) suspension 30 mL  30 mL Oral Daily PRN Gonzella Lex, MD      . nicotine (NICOTROL) 10 MG inhaler 1 continuous puffing  1 continuous puffing Inhalation PRN Hildred Priest, MD   1 continuous puffing at 11/01/14 1225  . OLANZapine (ZYPREXA) tablet 15 mg  15 mg Oral QHS Hildred Priest, MD   15 mg at 11/05/14 2159  . traZODone (DESYREL) tablet 100 mg  100 mg Oral QHS Hildred Priest, MD   100 mg at 11/05/14 2159    Lab Results:  Results for orders placed or performed during the hospital encounter of 10/30/14 (from the past 48 hour(s))  Basic metabolic panel     Status: Abnormal   Collection Time: 11/06/14  7:47 AM  Result Value Ref Range   Sodium 137 135 - 145 mmol/L   Potassium 4.2 3.5 - 5.1 mmol/L   Chloride 103 101 - 111 mmol/L   CO2 27 22 - 32 mmol/L   Glucose, Bld 181 (H) 65 - 99 mg/dL   BUN 14 6 - 20 mg/dL   Creatinine, Ser 1.15 0.61 - 1.24 mg/dL   Calcium 8.9 8.9 - 10.3 mg/dL   GFR calc non Af Amer >60 >60 mL/min   GFR calc Af Amer >60 >60 mL/min    Comment: (NOTE) The eGFR has been calculated using the CKD EPI equation. This calculation has not been validated in all clinical situations. eGFR's persistently <60 mL/min signify possible Chronic Kidney Disease.    Anion gap 7 5 - 15    Physical Findings: AIMS: Facial and Oral Movements Muscles of Facial Expression: None, normal Lips and Perioral Area: None, normal Jaw: None, normal Tongue: None, normal,Extremity Movements Upper (arms, wrists, hands, fingers): None, normal Lower (legs, knees, ankles, toes): None, normal, Trunk Movements Neck, shoulders, hips: None, normal, Overall Severity Severity of abnormal movements (highest score from questions above): None, normal Incapacitation due to abnormal movements: None, normal Patient's awareness of abnormal movements (rate only  patient's report): No Awareness, Dental Status Current problems with teeth and/or dentures?: No Does patient usually wear dentures?: No  CIWA:    COWS:     Treatment Plan Summary: Daily contact with patient to assess and evaluate symptoms and progress in treatment and Medication management   Schizoaffective disorder: Patient will be on Zyprexa 15 mg by mouth daily at bedtime.  Hypertension: Patient was seen by internal medicine just today he lisinopril was increased to 20 and he wasn't started on Norvasc 5 mg by mouth every morning. Today his blood pressure is much improved  For nicotine withdrawal: Patient will be started on Nicotrol inhaler  For insomnia the patient will be started on trazodone 100 mg by  mouth daily at bedtime when necessary  For agitation start the patient on Ativan 2 mg every 8 hours as needed  Hospitalization status: Patient is voluntary  Precautions continue every 15 minute checks  Collateral information: Patient will be discharged today   Medical Decision Making:  Established Problem, Stable/Improving (1)        Hildred Priest 11/06/2014, 4:08 PM

## 2014-11-07 NOTE — Progress Notes (Signed)
[facility] H&P Discharge Summary faxed to [facility] for hospital follow-up

## 2016-03-26 ENCOUNTER — Ambulatory Visit (INDEPENDENT_AMBULATORY_CARE_PROVIDER_SITE_OTHER): Payer: Medicare Other | Admitting: Urology

## 2016-03-26 ENCOUNTER — Encounter: Payer: Self-pay | Admitting: Urology

## 2016-03-26 VITALS — BP 132/83 | HR 79 | Ht 75.0 in | Wt 205.0 lb

## 2016-03-26 DIAGNOSIS — N401 Enlarged prostate with lower urinary tract symptoms: Secondary | ICD-10-CM | POA: Diagnosis not present

## 2016-03-26 DIAGNOSIS — R3129 Other microscopic hematuria: Secondary | ICD-10-CM | POA: Diagnosis not present

## 2016-03-26 DIAGNOSIS — N138 Other obstructive and reflux uropathy: Secondary | ICD-10-CM | POA: Diagnosis not present

## 2016-03-26 LAB — URINALYSIS, COMPLETE
Bilirubin, UA: NEGATIVE
Glucose, UA: NEGATIVE
KETONES UA: NEGATIVE
Leukocytes, UA: NEGATIVE
NITRITE UA: NEGATIVE
Protein, UA: NEGATIVE
SPEC GRAV UA: 1.01 (ref 1.005–1.030)
UUROB: 1 mg/dL (ref 0.2–1.0)
pH, UA: 7 (ref 5.0–7.5)

## 2016-03-26 LAB — BLADDER SCAN AMB NON-IMAGING: SCAN RESULT: 21

## 2016-03-26 LAB — MICROSCOPIC EXAMINATION: Bacteria, UA: NONE SEEN

## 2016-03-26 MED ORDER — FINASTERIDE 5 MG PO TABS: 5.0000 mg | ORAL_TABLET | Freq: Every day | ORAL | 3 refills | Status: DC

## 2016-03-26 NOTE — Progress Notes (Signed)
03/26/2016 2:27 PM   Ian Cook July 21, 1960 [id number]  Referring provider: [name], MD [address] [place], [place] [address]  Chief Complaint  Patient presents with  . Benign Prostatic Hypertrophy    New Patient    HPI: 55 year old male with schizophrenia who currently resides in a group home and is a ward of the state who presents today for further evaluation of possible bladder outlet obstruction and lower urinary tract symptoms.   He reports that during the daytime, he does have some urinary urgency and frequency. This however is not accompanied by nighttime symptoms and has no nocturia. He denies any history of urge incontinence. He is accompanied today by a caretaker at his group home who does mention that at times, he can go up to 3 times an hour to the bathroom but other times appears to be voiding normally.  Despite this, he does not seem to be bothered by his urinary symptoms.  He denies issues with his urinary stream, slow stream, or sensation of incomplete bladder emptying.  He was started partially 2 months ago on Flomax by his PCP.  His most recent creatinine was 1.4 on 01/28/16.  He does drink a large, of coffee every morning and at least one soda a day.  PVR 21 cc.    There was also question of possible microscopic hematuria. On urine dip from his PCP, there is evidence of positive hemoglobin but no microscopic exam. He denies a history of gross hematuria. He is a half a pack a day smoker.  He also has an upcoming appointment with nephrology for further evaluation of slight Lee worsening renal function. His most recent creatinine was 1.3 from 1.1 year ago.      IPSS    Row Name 03/26/16 1000         International Prostate Symptom Score   How often have you had the sensation of not emptying your bladder? Not at All     How often have you had to urinate less than every two hours? Almost always     How often have you found you stopped and started  again several times when you urinated? Not at All     How often have you found it difficult to postpone urination? Not at All     How often have you had a weak urinary stream? Less than 1 in 5 times     How often have you had to strain to start urination? Less than 1 in 5 times     How many times did you typically get up at night to urinate? 1 Time     Total IPSS Score 8       Quality of Life due to urinary symptoms   If you were to spend the rest of your life with your urinary condition just the way it is now how would you feel about that? Mostly Disatisfied        Score:  1-7 Mild 8-19 Moderate 20-35 Severe  PMH: Past Medical History:  Diagnosis Date  . Anxiety   . Glaucoma   . HTN (hypertension) 11/01/2014  . Hypertension   . Schizo-affective schizophrenia ([place])   . Schizoaffective disorder, manic type ([place]) 10/30/2014  . Tobacco use disorder 11/01/2014    Surgical History: Past Surgical History:  Procedure Laterality Date  . TONSILLECTOMY      Home Medications:    Medication List       Accurate as of 03/26/16  2:27 PM. Always use your most recent med list.          amLODipine 10 MG tablet Commonly known as:  NORVASC   finasteride 5 MG tablet Commonly known as:  PROSCAR Take 1 tablet (5 mg total) by mouth daily.   haloperidol 1 MG tablet Commonly known as:  HALDOL Take 1 mg by mouth 2 (two) times daily.   latanoprost 0.005 % ophthalmic solution Commonly known as:  XALATAN   lisinopril 20 MG tablet Commonly known as:  PRINIVIL,ZESTRIL Take 1 tablet (20 mg total) by mouth daily.   OLANZapine 15 MG tablet Commonly known as:  ZYPREXA Take 1 tablet (15 mg total) by mouth at bedtime.   tamsulosin 0.4 MG Caps capsule Commonly known as:  FLOMAX   timolol 0.5 % ophthalmic solution Commonly known as:  TIMOPTIC   traZODone 100 MG tablet Commonly known as:  DESYREL Take 1 tablet (100 mg total) by mouth at bedtime.       Allergies: No Known  Allergies  Family History: Family History  Problem Relation Age of Onset  . Aneurysm Mother   . CAD Father   . Kidney cancer Neg Hx   . Prostate cancer Neg Hx     Social History:  reports that he has been smoking Cigarettes.  He has been smoking about 0.50 packs per day. He has never used smokeless tobacco. He reports that he does not drink alcohol or use drugs.  ROS: UROLOGY Frequent Urination?: Yes Hard to postpone urination?: Yes Burning/pain with urination?: Yes Get up at night to urinate?: Yes Leakage of urine?: No Urine stream starts and stops?: Yes Trouble starting stream?: No Do you have to strain to urinate?: No Blood in urine?: Yes Urinary tract infection?: No Sexually transmitted disease?: No Injury to kidneys or bladder?: No Painful intercourse?: No Weak stream?: No Erection problems?: No Penile pain?: No  Gastrointestinal Nausea?: No Vomiting?: No Indigestion/heartburn?: No Diarrhea?: No Constipation?: No  Constitutional Fever: No Night sweats?: No Weight loss?: No Fatigue?: No  Skin Skin rash/lesions?: No Itching?: No  Eyes Blurred vision?: Yes Double vision?: No  Ears/Nose/Throat Sore throat?: No Sinus problems?: No  Hematologic/Lymphatic Swollen glands?: No Easy bruising?: No  Cardiovascular Leg swelling?: No Chest pain?: No  Respiratory Cough?: No Shortness of breath?: No  Endocrine Excessive thirst?: No  Musculoskeletal Back pain?: No Joint pain?: No  Neurological Headaches?: No Dizziness?: No  Psychologic Depression?: No Anxiety?: Yes  Physical Exam: BP 132/83   Pulse 79   Ht 6\' 3"  (1.905 m)   Wt 205 lb (93 kg)   BMI 25.62 kg/m   Constitutional:  Alert and oriented, No acute distress. HEENT: [place] AT, moist mucus membranes.  Trachea midline, no masses. Cardiovascular: No clubbing, cyanosis, or edema. Respiratory: Normal respiratory effort, no increased work of breathing. GI: Abdomen is soft, nontender,  nondistended, no abdominal masses GU: No CVA tenderness.  Rectal: Normal sphincter tone, enlarged prostate 50+ cc rubbery, no nodules.   Skin: No rashes, bruises or suspicious lesions. Lymph: No cervical or inguinal adenopathy. Neurologic: Grossly intact, no focal deficits, moving all 4 extremities. Psychiatric: Normal mood and affect.  Laboratory Data: Lab Results  Component Value Date   WBC 6.8 10/29/2014   HGB 13.8 10/29/2014   HCT 41.2 10/29/2014   MCV 94.2 10/29/2014   PLT 183 10/29/2014    Lab Results  Component Value Date   CREATININE 1.15 11/06/2014     Lab Results  Component Value Date  HGBA1C 5.3 11/01/2014    Urinalysis Results for orders placed or performed in visit on 03/26/16  Microscopic Examination  Result Value Ref Range   WBC, UA 0-5 0 - 5 /hpf   RBC, UA 0-2 0 - 2 /hpf   Epithelial Cells (non renal) 0-10 0 - 10 /hpf   Bacteria, UA None seen None seen/Few  Urinalysis, Complete  Result Value Ref Range   Specific Gravity, UA 1.010 1.005 - 1.030   pH, UA 7.0 5.0 - 7.5   Color, UA Yellow Yellow   Appearance Ur Clear Clear   Leukocytes, UA Negative Negative   Protein, UA Negative Negative/Trace   Glucose, UA Negative Negative   Ketones, UA Negative Negative   RBC, UA 1+ (A) Negative   Bilirubin, UA Negative Negative   Urobilinogen, Ur 1.0 0.2 - 1.0 mg/dL   Nitrite, UA Negative Negative   Microscopic Examination See below:   BLADDER SCAN AMB NON-IMAGING  Result Value Ref Range   Scan Result 21     Pertinent Imaging: n/a  Assessment & Plan:    1. BPH with obstruction/lower urinary tract symptoms No evidence of incomplete bladder emptying, minimal postvoid residual today Enlarged prostate appreciated today on exam, caregiver thinks he is improving on Flomax. Overall, patient is not particularly bothered by symptoms. Would recommend addition of finasteride to help optimize this the fact given the size of his gland on exam I have recommended  follow-up on a when necessary basis, can continue to follow with PCP unless symptoms worsen - Urinalysis, Complete - BLADDER SCAN AMB NON-IMAGING  2. Microscopic hematuria Question of history of microscopic hematuria, previously evaluated by urinary dip only No evidence of hematuria today on microscopic exam- no further workup was recommended at this time   Return if symptoms worsen or fail to improve.  [name], MD  [facility] [address], [place], [place] [address] [phone]

## 2016-03-29 ENCOUNTER — Telehealth: Payer: Self-pay | Admitting: Urology

## 2016-03-29 DIAGNOSIS — N400 Enlarged prostate without lower urinary tract symptoms: Secondary | ICD-10-CM

## 2016-03-29 MED ORDER — FINASTERIDE 5 MG PO TABS: 5.0000 mg | ORAL_TABLET | Freq: Every day | ORAL | 3 refills | Status: DC

## 2016-03-29 NOTE — Telephone Encounter (Signed)
Pt didn't get prescription for finasteride e-scribed to [facility].  Pt called pharmacy and they didn't receive.  Hassel Neth [phone]

## 2016-03-29 NOTE — Telephone Encounter (Signed)
Medication sent to pharmacy for pt.

## 2016-11-12 ENCOUNTER — Encounter (HOSPITAL_COMMUNITY): Payer: Self-pay | Admitting: Psychiatry

## 2016-11-12 ENCOUNTER — Encounter (INDEPENDENT_AMBULATORY_CARE_PROVIDER_SITE_OTHER): Payer: Self-pay

## 2016-11-12 ENCOUNTER — Ambulatory Visit (INDEPENDENT_AMBULATORY_CARE_PROVIDER_SITE_OTHER): Payer: Medicare HMO | Admitting: Psychiatry

## 2016-11-12 VITALS — BP 122/78 | HR 78 | Wt 213.0 lb

## 2016-11-12 DIAGNOSIS — Z79899 Other long term (current) drug therapy: Secondary | ICD-10-CM

## 2016-11-12 DIAGNOSIS — F250 Schizoaffective disorder, bipolar type: Secondary | ICD-10-CM | POA: Diagnosis not present

## 2016-11-12 DIAGNOSIS — F17210 Nicotine dependence, cigarettes, uncomplicated: Secondary | ICD-10-CM

## 2016-11-12 DIAGNOSIS — Z818 Family history of other mental and behavioral disorders: Secondary | ICD-10-CM | POA: Diagnosis not present

## 2016-11-12 MED ORDER — BENZTROPINE MESYLATE 0.5 MG PO TABS: 0.5000 mg | ORAL_TABLET | Freq: Two times a day (BID) | ORAL | 1 refills | Status: DC

## 2016-11-12 MED ORDER — OLANZAPINE 15 MG PO TABS: 15.0000 mg | ORAL_TABLET | Freq: Every day | ORAL | 1 refills | Status: DC

## 2016-11-12 MED ORDER — TRAZODONE HCL 100 MG PO TABS: 100.0000 mg | ORAL_TABLET | Freq: Every day | ORAL | 1 refills | Status: DC

## 2016-11-12 NOTE — Progress Notes (Signed)
A long

## 2016-11-12 NOTE — Progress Notes (Signed)
Psychiatric Initial Adult Assessment   Patient Identification: Ian Cook MRN:  [id number] Date of Evaluation:  11/12/2016 Referral Source: Group home  Chief Complaint:  I am doing fine.  I am taking my medication. Chief Complaint    Establish Care     Visit Diagnosis:    ICD-10-CM   1. Schizoaffective disorder, bipolar type ([place]) F25.0 traZODone (DESYREL) 100 MG tablet    benztropine (COGENTIN) 0.5 MG tablet    OLANZapine (ZYPREXA) 15 MG tablet    CBC with Differential/Platelet    COMPLETE METABOLIC PANEL WITH GFR    Hemoglobin A1c    TSH  2. Encounter for long-term (current) use of medications Z79.899 CBC with Differential/Platelet    COMPLETE METABOLIC PANEL WITH GFR    Hemoglobin A1c    TSH    History of Present Illness:  Patient is 56 year old African-American single, unemployed male who has a history of mental illness game with his caretaker and the owner of the group home.  Patient is living at a group home since December 2016.  Before he was living with his nephew who also owned a group home but due to neglect it was close.  [place] is a guardian of the patient.  Patient has a Education officer, museum Davonna Belling.  Patient is a poor historian but he is a pleasant and answer with limited conversation.  He mentioned he has a nerve problem and he's been hospitalized a few times because of the nerve problem.  Most of the information was obtained through [employer] medical record and caregiver.  Apparently patient has multiple hospitalization do to psychosis, running away from the group home and found walking 20 miles away from the group home.  He was last hospitalized at [facility] in May 2016 when he was wandering outside the group.  He was manic and having hallucination and noncompliant with medication.  As per caretaker upon discharge he was living with his nephew's group home but due to neglect of other clients it was closed.  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  Since he is living with her current group home he's been going to the doctor's appointment and taking his medication.  He is able to gain weight .  He sleeping good and denies any hospitalization.  He is calm cooperative however patient is still complaining of some time hallucination but that does not bother him.  He was prescribed Haldol when necessary but he has not taken more than a year.  He was seeing psychiatrist at [facility] but recent change in his insurance he no longer see them.  Patient and his caretaker wants to establish care in this office for the continuity of care.  Patient denies any suicidal thoughts, homicidal thought, mania, irritability, anger, agitation, self abusive behavior, panic attacks or any OCD or PTSD symptoms.  He is taking Zyprexa Cogentin and trazodone.  He sleeping good.  He has a roommate sometime he gets irritable with a roommate but there has been no recent aggressive behavior.  His appetite is okay.  He is seeing Dr. Haze Boyden for his medical needs.  Patient denies drinking alcohol or using any illegal substances.  He follows routine and rule out the group home.  His caretaker and the owner of the group home has no concerns or complaints.  Associated Signs/Symptoms: Depression Symptoms:  anxiety, (Hypo) Manic Symptoms:  No  current symptoms of mania Anxiety Symptoms:  Social Anxiety, Psychotic Symptoms:  Hallucinations: Chronic hallucinations which are unspecific but does not bother the patient PTSD Symptoms: Patient denies any history of physical sexual verbal or emotional abuse.  Past Psychiatric History: Patient reported hospitalized 3-4 times because of nerve issues.  As per chart he has a history of psychosis and manic symptoms.  He usually run away from the group home.  His last hospitalization was May 2016.  In the past he remember using Navane and  latuda.  He does not remember the details very well.  He denies any history of suicidal attempt or any self abusive behavior.  Previous Psychotropic Medications: No   Substance Abuse History in the last 12 months:  No.  Consequences of Substance Abuse: Negative  Past Medical History:  Past Medical History:  Diagnosis Date  . Anxiety   . Glaucoma   . HTN (hypertension) 11/01/2014  . Hypertension   . Schizo-affective schizophrenia ([place])   . Schizoaffective disorder, manic type ([place]) 10/30/2014  . Tobacco use disorder 11/01/2014    Past Surgical History:  Procedure Laterality Date  . TONSILLECTOMY      Family Psychiatric History: Patient reported mother has depression often losing the child at age 77.  Family History:  Family History  Problem Relation Age of Onset  . Aneurysm Mother   . Depression Mother   . CAD Father   . Kidney cancer Neg Hx   . Prostate cancer Neg Hx     Social History:   Social History   Social History  . Marital status: Married    Spouse name: N/A  . Number of children: N/A  . Years of education: N/A   Social History Main Topics  . Smoking status: Current Every Day Smoker    Packs/day: 0.50    Types: Cigarettes  . Smokeless tobacco: Never Used  . Alcohol use No  . Drug use: No  . Sexual activity: Not Asked   Other Topics Concern  . None   Social History Narrative   From Family care home.    Additional Social History: Patient born and raised in [place].  His parents are deceased.  He does not have any close family member who is living.  His sister and uncle aunt are also deceased.  He is living in group home for a long time.  In the beginning he was living with his nephew's group home but it was close due to neglect and now he is living in a different group home.  Patient never married and he has no children.  He remember finishing high school and worked in a Writer as a Dealer.  He do not do member the details.  Currently he  is on disability.  Allergies:  No Known Allergies  Metabolic Disorder Labs: Lab Results  Component Value Date   HGBA1C 5.3 11/01/2014   No results found for: PROLACTIN Lab Results  Component Value Date   CHOL 153 11/01/2014   TRIG 54 11/01/2014   HDL 67 11/01/2014   CHOLHDL 2.3 11/01/2014   VLDL 11 11/01/2014   LDLCALC 75 11/01/2014     Current Medications: Current Outpatient Prescriptions  Medication Sig Dispense Refill  . haloperidol (HALDOL) 1 MG tablet Take 1 mg by mouth 2 (two) times daily as needed for agitation.    Marland Kitchen amLODipine (NORVASC) 10 MG tablet     . benztropine (COGENTIN) 0.5 MG tablet Take 1 tablet (0.5 mg total) by  mouth 2 (two) times daily. 60 tablet 1  . finasteride (PROSCAR) 5 MG tablet Take 1 tablet (5 mg total) by mouth daily. 90 tablet 3  . latanoprost (XALATAN) 0.005 % ophthalmic solution     . lisinopril (PRINIVIL,ZESTRIL) 40 MG tablet     . OLANZapine (ZYPREXA) 15 MG tablet Take 1 tablet (15 mg total) by mouth at bedtime. 30 tablet 1  . tamsulosin (FLOMAX) 0.4 MG CAPS capsule     . timolol (TIMOPTIC) 0.5 % ophthalmic solution     . traZODone (DESYREL) 100 MG tablet Take 1 tablet (100 mg total) by mouth at bedtime. 30 tablet 1   No current facility-administered medications for this visit.     Neurologic: Headache: No Seizure: No Paresthesias:No  Musculoskeletal: Strength & Muscle Tone: within normal limits Gait & Station: normal Patient leans: N/A  Psychiatric Specialty Exam: Review of Systems  Constitutional: Negative.   HENT: Negative.   Eyes:       Left vision loss due to glaucoma.  Respiratory: Negative.   Cardiovascular: Negative.   Musculoskeletal: Negative.   Skin: Negative.   Neurological: Negative.     Blood pressure 122/78, pulse 78, weight 213 lb (96.6 kg), SpO2 95 %.Body mass index is 26.62 kg/m.  General Appearance: Casual and Superficially cooperative and poor historian but pleasant  Eye Contact:  Fair  Speech:   Slow  Volume:  Normal  Mood:  Euthymic  Affect:  Congruent  Thought Process:  Descriptions of Associations: Loose  Orientation:  Full (Time, Place, and Person)  Thought Content:  Hallucinations: Auditory Unspecific auditory hallucination  Suicidal Thoughts:  No  Homicidal Thoughts:  No  Memory:  Immediate;   Fair Recent;   Fair Remote;   Fair  Judgement:  Fair  Insight:  Fair  Psychomotor Activity:  Decreased  Concentration:  Concentration: Fair and Attention Span: Fair  Recall:  [employer] of Knowledge:Fair  Language: Fair  Akathisia:  No  Handed:  Right  AIMS (if indicated):  0  Assets:  Desire for Improvement Housing Social Support  ADL's:  Intact  Cognition: Impaired,  Mild  Sleep:  Good    Assessment: Schizoaffective disorder bipolar type.  Plan: I review his records, collateral information, current medication.  Patient is a poor historian and most of the information was obtained through caretaker who is also the owner of the group home.  We will get records from [facility] where he was getting treatment.  Patient has psychological testing done unable to get through the social services.  Patient has a Education officer, museum Davonna Belling at [facility].  At this time patient is stable on his current psychiatric medication.  I will continue Zyprexa 15 mg at bedtime, trazodone 100 mg at bedtime and Cogentin 0.5 mg twice a day.  We will do blood work including CBC, hemoglobin A1c, CMP and TSH.  Patient currently not seeing any therapist and does not need he need to see one.  Discussed medication side effects and benefits.  Recommended to call us back if he has any question, concern if he feel worsening of the symptom.  Follow-up in 4-6 weeks. ARFEEN,SYED T., MD 6/8/201811:39 AM

## 2016-12-14 LAB — CBC WITH DIFFERENTIAL/PLATELET
BASOS: 0 %
Basophils Absolute: 0 10*3/uL (ref 0.0–0.2)
EOS (ABSOLUTE): 0.3 10*3/uL (ref 0.0–0.4)
EOS: 4 %
HEMATOCRIT: 44 % (ref 37.5–51.0)
Hemoglobin: 14.8 g/dL (ref 13.0–17.7)
IMMATURE GRANULOCYTES: 0 %
Immature Grans (Abs): 0 10*3/uL (ref 0.0–0.1)
LYMPHS ABS: 2.4 10*3/uL (ref 0.7–3.1)
Lymphs: 35 %
MCH: 31.6 pg (ref 26.6–33.0)
MCHC: 33.6 g/dL (ref 31.5–35.7)
MCV: 94 fL (ref 79–97)
MONOS ABS: 0.4 10*3/uL (ref 0.1–0.9)
Monocytes: 6 %
NEUTROS ABS: 3.8 10*3/uL (ref 1.4–7.0)
NEUTROS PCT: 55 %
Platelets: 226 10*3/uL (ref 150–379)
RBC: 4.69 x10E6/uL (ref 4.14–5.80)
RDW: 13.9 % (ref 12.3–15.4)
WBC: 6.9 10*3/uL (ref 3.4–10.8)

## 2016-12-14 LAB — TSH: TSH: 0.553 u[IU]/mL (ref 0.450–4.500)

## 2016-12-14 LAB — CMP14+EGFR
ALT: 17 IU/L (ref 0–44)
AST: 22 IU/L (ref 0–40)
Albumin/Globulin Ratio: 1.5 (ref 1.2–2.2)
Albumin: 4.3 g/dL (ref 3.5–5.5)
Alkaline Phosphatase: 61 IU/L (ref 39–117)
BUN / CREAT RATIO: 12 (ref 9–20)
BUN: 17 mg/dL (ref 6–24)
Bilirubin Total: 0.3 mg/dL (ref 0.0–1.2)
CO2: 22 mmol/L (ref 20–29)
Calcium: 9.3 mg/dL (ref 8.7–10.2)
Chloride: 104 mmol/L (ref 96–106)
Creatinine, Ser: 1.45 mg/dL — ABNORMAL HIGH (ref 0.76–1.27)
GFR calc Af Amer: 61 mL/min/{1.73_m2} (ref >59)
GFR, EST NON AFRICAN AMERICAN: 53 mL/min/{1.73_m2} — AB (ref >59)
GLOBULIN, TOTAL: 2.8 g/dL (ref 1.5–4.5)
Glucose: 122 mg/dL — ABNORMAL HIGH (ref 65–99)
Potassium: 4.4 mmol/L (ref 3.5–5.2)
SODIUM: 142 mmol/L (ref 134–144)
Total Protein: 7.1 g/dL (ref 6.0–8.5)

## 2016-12-14 LAB — HGB A1C W/O EAG: Hgb A1c MFr Bld: 6.1 % — ABNORMAL HIGH (ref 4.8–5.6)

## 2016-12-17 ENCOUNTER — Encounter (HOSPITAL_COMMUNITY): Payer: Self-pay | Admitting: Psychiatry

## 2016-12-17 ENCOUNTER — Ambulatory Visit (INDEPENDENT_AMBULATORY_CARE_PROVIDER_SITE_OTHER): Payer: Medicare HMO | Admitting: Psychiatry

## 2016-12-17 DIAGNOSIS — Z818 Family history of other mental and behavioral disorders: Secondary | ICD-10-CM

## 2016-12-17 DIAGNOSIS — F250 Schizoaffective disorder, bipolar type: Secondary | ICD-10-CM | POA: Diagnosis not present

## 2016-12-17 DIAGNOSIS — F17210 Nicotine dependence, cigarettes, uncomplicated: Secondary | ICD-10-CM

## 2016-12-17 MED ORDER — OLANZAPINE 10 MG PO TABS: 10.0000 mg | ORAL_TABLET | Freq: Every day | ORAL | 0 refills | Status: DC

## 2016-12-17 MED ORDER — BENZTROPINE MESYLATE 0.5 MG PO TABS: 0.5000 mg | ORAL_TABLET | Freq: Two times a day (BID) | ORAL | 0 refills | Status: DC

## 2016-12-17 MED ORDER — TRAZODONE HCL 100 MG PO TABS: 100.0000 mg | ORAL_TABLET | Freq: Every day | ORAL | 0 refills | Status: DC

## 2016-12-17 NOTE — Progress Notes (Signed)
BH MD/PA/NP OP Progress Note  12/17/2016 10:41 AM Ian Cook  MRN:  [id number]  Chief Complaint:  Subjective:  I am doing fine.  HPI: Patient is a 56 year old African-American single unemployed unmarried man who was seen first time 4 weeks ago.  Patient has history of mental illness.  He is a poor historian.  He was living in a group home which owned by his nephew but due to neglect it was close.  Social worker is involved and now he is living in a different group home and owner came with the patient on his appointment.  Most of the information was obtained through the owner of the group home.  Patient has difficulty remembering things.  He has a psychological test and we are still waiting for the records from his social worker.  Patient is taking Zyprexa and Cogentin and trazodone.  He is also given when necessary Haldol however he has not taken in a while.  Patient has history of running away from group home.  However he likes this current group home .  He had a blood work and his hemoglobin A1c 6.1 and his creatinine is 1.45.  He is trying to lose.  He lost 3 pounds from the last visit.  He denies any hallucination or any paranoia but he is limited to provide detailed information.  As per group home he is taking his medication and there has been no recent behavior problem.  He sleeping good.  He has few friends and he get along with them.  His appetite is okay.  He admitted drinking soda more than he is supposed to and is trying to cut it down.  Patient denies drinking alcohol or using any illegal substances.  His primary care physician is Dr. Haze Boyden.  Patient never married and he has no children.  His parents are deceased.  He has no close family members living.  Visit Diagnosis:    ICD-10-CM   1. Schizoaffective disorder, bipolar type ([place]) F25.0 benztropine (COGENTIN) 0.5 MG tablet    traZODone (DESYREL) 100 MG tablet    OLANZapine (ZYPREXA) 10 MG tablet    Past Psychiatric History:  Reviewed. Patient has history of psychosis and manic symptoms.  He is hospitalized 3-4 times and history of runaway from the group home.  His last hospitalized and was in May 2016.  Patient do not remember the details very well but reported he used to take Navane and Latuda.  Patient denies any history of suicidal attempt or any self abusive behavior.  Past Medical History:  Past Medical History:  Diagnosis Date  . Anxiety   . Glaucoma   . HTN (hypertension) 11/01/2014  . Hypertension   . Schizo-affective schizophrenia ([place])   . Schizoaffective disorder, manic type ([place]) 10/30/2014  . Tobacco use disorder 11/01/2014    Past Surgical History:  Procedure Laterality Date  . TONSILLECTOMY      Family Psychiatric History: Reviewed.  Family History:  Family History  Problem Relation Age of Onset  . Aneurysm Mother   . Depression Mother   . CAD Father   . Kidney cancer Neg Hx   . Prostate cancer Neg Hx     Social History:  Social History   Social History  . Marital status: Married    Spouse name: N/A  . Number of children: N/A  . Years of education: N/A   Social History Main Topics  . Smoking status: Current Every Day Smoker    Packs/day: 0.50  Types: Cigarettes  . Smokeless tobacco: Never Used  . Alcohol use No  . Drug use: No  . Sexual activity: No   Other Topics Concern  . None   Social History Narrative   From [facility] care home.    Allergies: No Known Allergies  Metabolic Disorder Labs: Recent Results (from the past 2160 hour(s))  CBC with Differential/Platelet     Status: None   Collection Time: 12/13/16  3:52 PM  Result Value Ref Range   WBC 6.9 3.4 - 10.8 x10E3/uL   RBC 4.69 4.14 - 5.80 x10E6/uL   Hemoglobin 14.8 13.0 - 17.7 g/dL   Hematocrit 44.0 37.5 - 51.0 %   MCV 94 79 - 97 fL   MCH 31.6 26.6 - 33.0 pg   MCHC 33.6 31.5 - 35.7 g/dL   RDW 13.9 12.3 - 15.4 %   Platelets 226 150 - 379 x10E3/uL   Neutrophils 55 Not Estab. %   Lymphs 35 Not Estab.  %   Monocytes 6 Not Estab. %   Eos 4 Not Estab. %   Basos 0 Not Estab. %   Neutrophils Absolute 3.8 1.4 - 7.0 x10E3/uL   Lymphocytes Absolute 2.4 0.7 - 3.1 x10E3/uL   Monocytes Absolute 0.4 0.1 - 0.9 x10E3/uL   EOS (ABSOLUTE) 0.3 0.0 - 0.4 x10E3/uL   Basophils Absolute 0.0 0.0 - 0.2 x10E3/uL   Immature Granulocytes 0 Not Estab. %   Immature Grans (Abs) 0.0 0.0 - 0.1 x10E3/uL  TSH     Status: None   Collection Time: 12/13/16  3:52 PM  Result Value Ref Range   TSH 0.553 0.450 - 4.500 uIU/mL  CMP14+EGFR     Status: Abnormal   Collection Time: 12/13/16  3:52 PM  Result Value Ref Range   Glucose 122 (H) 65 - 99 mg/dL   BUN 17 6 - 24 mg/dL   Creatinine, Ser 1.45 (H) 0.76 - 1.27 mg/dL   GFR calc non Af Amer 53 (L) >59 mL/min/1.73   GFR calc Af Amer 61 >59 mL/min/1.73   BUN/Creatinine Ratio 12 9 - 20   Sodium 142 134 - 144 mmol/L   Potassium 4.4 3.5 - 5.2 mmol/L   Chloride 104 96 - 106 mmol/L   CO2 22 20 - 29 mmol/L   Calcium 9.3 8.7 - 10.2 mg/dL   Total Protein 7.1 6.0 - 8.5 g/dL   Albumin 4.3 3.5 - 5.5 g/dL   Globulin, Total 2.8 1.5 - 4.5 g/dL   Albumin/Globulin Ratio 1.5 1.2 - 2.2   Bilirubin Total 0.3 0.0 - 1.2 mg/dL   Alkaline Phosphatase 61 39 - 117 IU/L   AST 22 0 - 40 IU/L   ALT 17 0 - 44 IU/L  Hgb A1c w/o eAG     Status: Abnormal   Collection Time: 12/13/16  3:52 PM  Result Value Ref Range   Hgb A1c MFr Bld 6.1 (H) 4.8 - 5.6 %    Comment:          Pre-diabetes: 5.7 - 6.4          Diabetes: >6.4          Glycemic control for adults with diabetes: <7.0    Lab Results  Component Value Date   HGBA1C 6.1 (H) 12/13/2016   No results found for: PROLACTIN Lab Results  Component Value Date   CHOL 153 11/01/2014   TRIG 54 11/01/2014   HDL 67 11/01/2014   CHOLHDL 2.3 11/01/2014   VLDL 11 11/01/2014  [facility] 75 11/01/2014     Current Medications: Current Outpatient Prescriptions  Medication Sig Dispense Refill  . amLODipine (NORVASC) 10 MG tablet     .  benztropine (COGENTIN) 0.5 MG tablet Take 1 tablet (0.5 mg total) by mouth 2 (two) times daily. 60 tablet 1  . finasteride (PROSCAR) 5 MG tablet Take 1 tablet (5 mg total) by mouth daily. 90 tablet 3  . haloperidol (HALDOL) 1 MG tablet Take 1 mg by mouth 2 (two) times daily as needed for agitation.    Marland Kitchen latanoprost (XALATAN) 0.005 % ophthalmic solution     . lisinopril (PRINIVIL,ZESTRIL) 40 MG tablet     . OLANZapine (ZYPREXA) 15 MG tablet Take 1 tablet (15 mg total) by mouth at bedtime. 30 tablet 1  . tamsulosin (FLOMAX) 0.4 MG CAPS capsule     . timolol (TIMOPTIC) 0.5 % ophthalmic solution     . traZODone (DESYREL) 100 MG tablet Take 1 tablet (100 mg total) by mouth at bedtime. 30 tablet 1   No current facility-administered medications for this visit.     Neurologic: Headache: No Seizure: No Paresthesias: No  Musculoskeletal: Strength & Muscle Tone: within normal limits Gait & Station: normal Patient leans: N/A  Psychiatric Specialty Exam: Review of Systems  Constitutional: Positive for weight loss. Negative for malaise/fatigue.  HENT: Negative.   Eyes:       Loss of vision in left eye due to glaucoma  Respiratory: Negative.   Cardiovascular: Negative.   Gastrointestinal: Negative.   Genitourinary: Negative.   Musculoskeletal: Negative.   Skin: Negative.   Neurological: Negative.     Blood pressure 128/74, pulse 83, height 6' 3"  (1.905 m), weight 210 lb (95.3 kg).Body mass index is 26.25 kg/m.  General Appearance: Casual and sup cooperative  Eye Contact:  Fair  Speech:  Slow  Volume:  Decreased  Mood:  Euthymic  Affect:  Congruent  Thought Process:  Descriptions of Associations: Loose  Orientation:  Full (Time, Place, and Person)  Thought Content: Logical   Suicidal Thoughts:  No  Homicidal Thoughts:  No  Memory:  Immediate;   Good Recent;   Fair Remote;   Fair  Judgement:  Good  Insight:  Good  Psychomotor Activity:  Normal  Concentration:  Concentration:  Fair and Attention Span: Fair  Recall:  [employer] of Knowledge: Fair  Language: Fair  Akathisia:  No  Handed:  Right  AIMS (if indicated):  0  Assets:  Communication Skills Desire for Improvement Housing  ADL's:  Intact  Cognition: Impaired,  Mild  Sleep:  good   Assessment: Schizoaffective disorder bipolar type.  Plan: I review his blood work results.  His A1c is 6.1 and his creatinine is 1.45.  I encouraged to see primary care physician for high creatinine.  Encourage weight loss.  Recommended to reduce Zyprexa 10 mg total for his hemoglobin A1c if Zyprexa causing it.  Encourage hydration and avoid soda due to high creatinine.  Continue Cogentin 0.5 mg twice a day and trazodone 100 mg at bedtime.  Explained if symptoms started to relapse then call us immediately.  Patient is not interested in counseling.  We will is still trying to get psychological testing report from social worker.  Owner of the group home is also in touch with the social worker and she would also tried to get records for [place].  Recommended to call us back if there is any question.  Follow-up in 3 months. Time spent 25 minutes.  More than 50% of the time spent in psychoeducation, counseling and coordination of care.  Discuss safety plan that anytime having active suicidal thoughts or homicidal thoughts then patient need to call 911 or go to the local emergency room.    Farrah Skoda T., MD    12/17/2016, 10:41 AM

## 2016-12-29 ENCOUNTER — Other Ambulatory Visit (HOSPITAL_COMMUNITY): Payer: Self-pay | Admitting: Psychiatry

## 2016-12-29 DIAGNOSIS — F250 Schizoaffective disorder, bipolar type: Secondary | ICD-10-CM

## 2017-03-14 ENCOUNTER — Ambulatory Visit (INDEPENDENT_AMBULATORY_CARE_PROVIDER_SITE_OTHER): Payer: Medicare HMO | Admitting: Psychiatry

## 2017-03-14 ENCOUNTER — Encounter (HOSPITAL_COMMUNITY): Payer: Self-pay | Admitting: Psychiatry

## 2017-03-14 DIAGNOSIS — Z818 Family history of other mental and behavioral disorders: Secondary | ICD-10-CM | POA: Diagnosis not present

## 2017-03-14 DIAGNOSIS — F17210 Nicotine dependence, cigarettes, uncomplicated: Secondary | ICD-10-CM

## 2017-03-14 DIAGNOSIS — F250 Schizoaffective disorder, bipolar type: Secondary | ICD-10-CM | POA: Diagnosis not present

## 2017-03-14 MED ORDER — BENZTROPINE MESYLATE 0.5 MG PO TABS: 0.5000 mg | ORAL_TABLET | Freq: Two times a day (BID) | ORAL | 0 refills | Status: DC

## 2017-03-14 MED ORDER — OLANZAPINE 10 MG PO TABS: 10.0000 mg | ORAL_TABLET | Freq: Every day | ORAL | 0 refills | Status: DC

## 2017-03-14 MED ORDER — TRAZODONE HCL 100 MG PO TABS: 100.0000 mg | ORAL_TABLET | Freq: Every day | ORAL | 0 refills | Status: DC

## 2017-03-14 NOTE — Progress Notes (Signed)
BH MD/PA/NP OP Progress Note  03/14/2017 11:08 AM Ian Cook  MRN:  [id number]  Chief Complaint:  I'm doing better.  I'm sleeping good.  HPI: Patient came for his follow-up appointment.  On his last visit we reduce Zyprexa from 15 mg to 10 mg due to high hemoglobin A1c.  Patient doing very well on his medication.  He denies any irritability, anger, mania, psychosis or any hallucination.  Patient is limited to provide information.  Today he came with the owner of the group home who mentioned that patient has no major behavior problem.  He is follow the rules and regulation of the group home and there has been no recent agitation or outbursts.  He was given 3 weeks ago Haldol 1 mg because he was noticed "jumpy".  Patient do not recall any hallucination or aggressive behavior.  Owner was not happy that he was given Haldol because of jumpy and she has mention to the staff that next time she should be consulted before giving any Haldol.  He sleeping good.  He saw his primary care physician Dr. Haze Boyden and there were no changes in his medication.  He will have another blood work 3 months from now.  His activity level is good.  His energy level is good.  He denies any paranoia or any hallucination.  He wants to continue his current psychiatric medication.  Patient denies drinking alcohol or using any illegal substances.  He lives in a group home.  He never married and he has no children.  The never received any psychological testing results from social services.  Visit Diagnosis:    ICD-10-CM   1. Schizoaffective disorder, bipolar type ([place]) F25.0 traZODone (DESYREL) 100 MG tablet    OLANZapine (ZYPREXA) 10 MG tablet    benztropine (COGENTIN) 0.5 MG tablet    Past Psychiatric History: Reviewed. Patient has history of psychosis and manic symptoms.  He is hospitalized 3-4 times and history of runaway from the group home.  His last hospitalized and was in May 2016.  Patient do not remember the details very  well but reported he used to take Navane and Latuda.  Patient denies any history of suicidal attempt or any self abusive behavior.  Past Medical History:  Past Medical History:  Diagnosis Date  . Anxiety   . Glaucoma   . HTN (hypertension) 11/01/2014  . Hypertension   . Schizo-affective schizophrenia ([place])   . Schizoaffective disorder, manic type ([place]) 10/30/2014  . Tobacco use disorder 11/01/2014    Past Surgical History:  Procedure Laterality Date  . TONSILLECTOMY      Family Psychiatric History: Reviewed.  Family History:  Family History  Problem Relation Age of Onset  . Aneurysm Mother   . Depression Mother   . CAD Father   . Kidney cancer Neg Hx   . Prostate cancer Neg Hx     Social History:  Social History   Social History  . Marital status: Married    Spouse name: N/A  . Number of children: N/A  . Years of education: N/A   Social History Main Topics  . Smoking status: Current Every Day Smoker    Packs/day: 0.50    Types: Cigarettes  . Smokeless tobacco: Never Used  . Alcohol use No  . Drug use: No  . Sexual activity: No   Other Topics Concern  . Not on file   Social History Narrative   From [facility].    Allergies: No Known Allergies  Metabolic Disorder Labs: Lab Results  Component Value Date   HGBA1C 6.1 (H) 12/13/2016   No results found for: PROLACTIN Lab Results  Component Value Date   CHOL 153 11/01/2014   TRIG 54 11/01/2014   HDL 67 11/01/2014   CHOLHDL 2.3 11/01/2014   VLDL 11 11/01/2014   LDLCALC 75 11/01/2014   Lab Results  Component Value Date   TSH 0.553 12/13/2016   TSH 0.749 11/01/2014    Therapeutic Level Labs: No results found for: LITHIUM No results found for: VALPROATE No components found for:  CBMZ  Current Medications: Current Outpatient Prescriptions  Medication Sig Dispense Refill  . amLODipine (NORVASC) 10 MG tablet     . benztropine (COGENTIN) 0.5 MG tablet Take 1 tablet (0.5 mg total) by mouth 2  (two) times daily. 180 tablet 0  . finasteride (PROSCAR) 5 MG tablet Take 1 tablet (5 mg total) by mouth daily. 90 tablet 3  . haloperidol (HALDOL) 1 MG tablet Take 1 mg by mouth 2 (two) times daily as needed for agitation.    Marland Kitchen latanoprost (XALATAN) 0.005 % ophthalmic solution     . lisinopril (PRINIVIL,ZESTRIL) 40 MG tablet     . OLANZapine (ZYPREXA) 10 MG tablet Take 1 tablet (10 mg total) by mouth at bedtime. 90 tablet 0  . tamsulosin (FLOMAX) 0.4 MG CAPS capsule     . timolol (TIMOPTIC) 0.5 % ophthalmic solution     . traZODone (DESYREL) 100 MG tablet Take 1 tablet (100 mg total) by mouth at bedtime. 90 tablet 0   No current facility-administered medications for this visit.      Musculoskeletal: Strength & Muscle Tone: within normal limits Gait & Station: normal Patient leans: N/A  Psychiatric Specialty Exam: Review of Systems  HENT: Negative.   Eyes:       Loss of vision in his left eye due to glaucoma  Respiratory: Negative.   Cardiovascular: Negative.   Genitourinary: Negative.   Musculoskeletal: Negative.   Skin: Negative.     Blood pressure 132/80, pulse 67, height 6\' 2"  (1.88 m), weight 209 lb (94.8 kg).There is no height or weight on file to calculate BMI.  General Appearance: Casual  Eye Contact:  Fair  Speech:  Slow  Volume:  Decreased  Mood:  Euthymic  Affect:  Flat  Thought Process:  Descriptions of Associations: Loose  Orientation:  Full (Time, Place, and Person)  Thought Content: poverty of thought content   Suicidal Thoughts:  No  Homicidal Thoughts:  No  Memory:  Immediate;   Fair Recent;   Fair Remote;   Fair  Judgement:  Fair  Insight:  Fair  Psychomotor Activity:  Decreased  Concentration:  Concentration: Fair and Attention Span: Fair  Recall:  Good  Fund of Knowledge: Good  Language: Good  Akathisia:  No  Handed:  Right  AIMS (if indicated): not done  Assets:  Communication Skills Desire for Improvement Housing Social Support  ADL's:   Intact  Cognition: Impaired,  Mild  Sleep:  Good   Screenings: AIMS     Admission (Discharged) from 10/30/2014 in [facility] Total Score  0    AUDIT     Admission (Discharged) from 10/30/2014 in [place]  Alcohol Use Disorder Identification Test Final Score (AUDIT)  0       Assessment and Plan: Schizoaffective disorder, bipolar type.  Patient doing better on his medication.  He has no mania or psychosis at this time.  Continue Zyprexa 10 mg at bedtime, trazodone 100 mg at bedtime and Cogentin 0.5 mg twice a day.  We are still awaiting records from social services.  Recommended not to use Haldol unless it is necessary.  Patient will have blood work when he sees primary care physician next time and I reminded that labs should be faxed to [place].  Recommended to call us back if he has any question or any concern.  Follow-up in 3 months.   Ceaira Ernster T., MD 03/14/2017, 11:08 AM

## 2017-06-14 ENCOUNTER — Ambulatory Visit (INDEPENDENT_AMBULATORY_CARE_PROVIDER_SITE_OTHER): Payer: Medicare HMO | Admitting: Psychiatry

## 2017-06-14 ENCOUNTER — Encounter (HOSPITAL_COMMUNITY): Payer: Self-pay | Admitting: Psychiatry

## 2017-06-14 DIAGNOSIS — Z818 Family history of other mental and behavioral disorders: Secondary | ICD-10-CM

## 2017-06-14 DIAGNOSIS — F250 Schizoaffective disorder, bipolar type: Secondary | ICD-10-CM | POA: Diagnosis not present

## 2017-06-14 DIAGNOSIS — F17210 Nicotine dependence, cigarettes, uncomplicated: Secondary | ICD-10-CM

## 2017-06-14 MED ORDER — BENZTROPINE MESYLATE 0.5 MG PO TABS: 0.5000 mg | ORAL_TABLET | Freq: Two times a day (BID) | ORAL | 0 refills | Status: DC

## 2017-06-14 MED ORDER — OLANZAPINE 10 MG PO TABS: 10.0000 mg | ORAL_TABLET | Freq: Every day | ORAL | 0 refills | Status: DC

## 2017-06-14 NOTE — Progress Notes (Signed)
[facility] MD/PA/NP OP Progress Note  06/14/2017 10:42 AM Ian Cook  MRN:  [id number]  Chief Complaint: I had a good Christmas.  HPI: Ian Cook came for his follow-up appointment with his group home attendant.  He is taking his medication and is doing better.  He is sleeping good.  His attendant reported that patient is not agitated or angry.  He goes to sleep at his schedule bedtime.  He had a good Thanksgiving.  He is taking Zyprexa 10 mg which we reduced from 50 mg due to high hemoglobin A1c.  Today patient brought his blood work which was done by his primary care physician.  His glucose is 89.  His creatinine is 1.47.  His CBC is normal.  His liver function test and basic electrolytes were also normal.  Patient is still hearing voices on and off but he does not get distracted from them.  He is no longer taking Haldol.  His Haldol was given for as needed agitation but since the last visit he is more calm and did not require Haldol.  Patient denies any crying spells, feeling of hopelessness or worthlessness.  He follows the rules of the group home.  He was happy because his family took him at Christmas and he had a good time.  We are still waiting psychological testing results from the social services.  Patient denies drinking alcohol or using any illegal substances.  He has no tremors shakes or any EPS.  Visit Diagnosis:    ICD-10-CM   1. Schizoaffective disorder, bipolar type ([place]) F25.0 OLANZapine (ZYPREXA) 10 MG tablet    benztropine (COGENTIN) 0.5 MG tablet    Past Psychiatric History: Reviewed. Patient has history of psychosis and manic symptoms. He is hospitalized 3-4 times and history of runaway from the group home. His last hospitalized and was in May 2016. Patient do not remember the details very well but reported he used to take Navane and Latuda. Patient denies any history of suicidal attempt or any self abusive behavior.  Past Medical History:  Past Medical History:  Diagnosis Date  .  Anxiety   . Glaucoma   . HTN (hypertension) 11/01/2014  . Hypertension   . Schizo-affective schizophrenia ([place])   . Schizoaffective disorder, manic type ([place]) 10/30/2014  . Tobacco use disorder 11/01/2014    Past Surgical History:  Procedure Laterality Date  . TONSILLECTOMY      Family Psychiatric History: Reviewed.  Family History:  Family History  Problem Relation Age of Onset  . Aneurysm Mother   . Depression Mother   . CAD Father   . Kidney cancer Neg Hx   . Prostate cancer Neg Hx     Social History:  Social History   Socioeconomic History  . Marital status: Married    Spouse name: Not on file  . Number of children: Not on file  . Years of education: Not on file  . Highest education level: Not on file  Social Needs  . Financial resource strain: Not on file  . Food insecurity - worry: Not on file  . Food insecurity - inability: Not on file  . Transportation needs - medical: Not on file  . Transportation needs - non-medical: Not on file  Occupational History  . Not on file  Tobacco Use  . Smoking status: Current Every Day Smoker    Packs/day: 0.50    Types: Cigarettes  . Smokeless tobacco: Never Used  Substance and Sexual Activity  . Alcohol use: No  Alcohol/week: 0.0 oz  . Drug use: No  . Sexual activity: No  Other Topics Concern  . Not on file  Social History Narrative   From [facility].    Allergies: No Known Allergies  Metabolic Disorder Labs: Lab Results  Component Value Date   HGBA1C 6.1 (H) 12/13/2016   No results found for: PROLACTIN Lab Results  Component Value Date   CHOL 153 11/01/2014   TRIG 54 11/01/2014   HDL 67 11/01/2014   CHOLHDL 2.3 11/01/2014   VLDL 11 11/01/2014   LDLCALC 75 11/01/2014   Lab Results  Component Value Date   TSH 0.553 12/13/2016   TSH 0.749 11/01/2014    Therapeutic Level Labs: No results found for: LITHIUM No results found for: VALPROATE No components found for:  CBMZ  Current  Medications: Current Outpatient Medications  Medication Sig Dispense Refill  . amLODipine (NORVASC) 10 MG tablet     . benztropine (COGENTIN) 0.5 MG tablet Take 1 tablet (0.5 mg total) by mouth 2 (two) times daily. 180 tablet 0  . finasteride (PROSCAR) 5 MG tablet Take 1 tablet (5 mg total) by mouth daily. 90 tablet 3  . haloperidol (HALDOL) 1 MG tablet Take 1 mg by mouth 2 (two) times daily as needed for agitation.    Marland Kitchen latanoprost (XALATAN) 0.005 % ophthalmic solution     . lisinopril (PRINIVIL,ZESTRIL) 40 MG tablet     . OLANZapine (ZYPREXA) 10 MG tablet Take 1 tablet (10 mg total) by mouth at bedtime. 90 tablet 0  . tamsulosin (FLOMAX) 0.4 MG CAPS capsule     . traZODone (DESYREL) 100 MG tablet Take 1 tablet (100 mg total) by mouth at bedtime. 90 tablet 0   No current facility-administered medications for this visit.      Musculoskeletal: Strength & Muscle Tone: within normal limits Gait & Station: normal Patient leans: N/A  Psychiatric Specialty Exam: ROS  Blood pressure 118/76, pulse 66, height 6' (1.829 m), weight 208 lb (94.3 kg), SpO2 97 %.There is no height or weight on file to calculate BMI.  General Appearance: Casual  Eye Contact:  Fair  Speech:  Slow  Volume:  Decreased  Mood:  Euthymic  Affect:  Congruent  Thought Process:  Goal Directed  Orientation:  Full (Time, Place, and Person)  Thought Content: Logical   Suicidal Thoughts:  No  Homicidal Thoughts:  No  Memory:  Immediate;   Fair Recent;   Fair Remote;   Fair  Judgement:  Good  Insight:  Good  Psychomotor Activity:  Normal  Concentration:  Concentration: Fair and Attention Span: Fair  Recall:  [employer] of Knowledge: Fair  Language: Good  Akathisia:  No  Handed:  Right  AIMS (if indicated): not done  Assets:  Communication Skills Desire for Improvement Housing Social Support  ADL's:  Intact  Cognition: Impaired,  Mild  Sleep:  Good   Screenings: AIMS     Admission (Discharged) from  10/30/2014 in [facility] Total Score  0    AUDIT     Admission (Discharged) from 10/30/2014 in [facility]  Alcohol Use Disorder Identification Test Final Score (AUDIT)  0       Assessment and Plan: Schizoaffective disorder, bipolar type.  Patient doing better on his medication.  I reviewed blood work results from his primary care physician which was done on May 14, 2017.  He has not receive Haldol in past 3 months.  Continue  Zyprexa 10 mg at bedtime, trazodone 100 mg at bedtime and Cogentin 0.5 mg twice a day.  We are still waiting records from the social services.  Recommended to call us back if is any question, concern for feel worsening of symptoms.  Follow-up in 3 months.  Discussed healthy lifestyle and encouraged to watch her calorie intake and do regular exercise.   Kathlee Nations, MD 06/14/2017, 10:42 AM

## 2017-09-14 ENCOUNTER — Ambulatory Visit (HOSPITAL_COMMUNITY): Payer: Medicare HMO | Admitting: Psychiatry

## 2017-09-14 ENCOUNTER — Encounter (HOSPITAL_COMMUNITY): Payer: Self-pay | Admitting: Psychiatry

## 2017-09-14 DIAGNOSIS — F250 Schizoaffective disorder, bipolar type: Secondary | ICD-10-CM

## 2017-09-14 DIAGNOSIS — Z818 Family history of other mental and behavioral disorders: Secondary | ICD-10-CM | POA: Diagnosis not present

## 2017-09-14 DIAGNOSIS — F17210 Nicotine dependence, cigarettes, uncomplicated: Secondary | ICD-10-CM | POA: Diagnosis not present

## 2017-09-14 MED ORDER — OLANZAPINE 10 MG PO TABS: 10.0000 mg | ORAL_TABLET | Freq: Every day | ORAL | 0 refills | Status: DC

## 2017-09-14 MED ORDER — BENZTROPINE MESYLATE 0.5 MG PO TABS: 0.5000 mg | ORAL_TABLET | Freq: Two times a day (BID) | ORAL | 0 refills | Status: DC

## 2017-09-14 MED ORDER — TRAZODONE HCL 100 MG PO TABS: 100.0000 mg | ORAL_TABLET | Freq: Every day | ORAL | 0 refills | Status: DC

## 2017-09-14 NOTE — Progress Notes (Signed)
[facility] MD/PA/NP OP Progress Note  09/14/2017 10:36 AM Ian Cook  MRN:  [id number]  Chief Complaint: I am doing better.  I am sleeping good.  HPI: Patient came for his follow-up appointment with his group home staff.  Patient taking his medication and denies any side effects.  Staff reported that he is not agitated or angry.  He is sleeping good.  He is taking Zyprexa 10 mg, Cogentin 0.5 mg and trazodone 100 mg at bedtime.  He has no tremors or shakes.  He still hears voices on and off but not on a regular basis.  He reported these voices does not bother him.  He denies any suicidal thoughts or any feeling of hopelessness or worthlessness.  He has not received any Haldol in past 6 months.  He like group home and there has been no recent issues from the staff about his behavior.  He was recently seen his primary care physician Dr. Carolin Coy and his cholesterol medicines were changed and his lisinopril was discontinued.  He is still taking amlodipine.  Patient has no tremors, shakes or any EPS.  He follows the rules of the group home.  His appetite is okay.  His vital signs are stable.  Patient denies drinking alcohol or using any illegal substances.  Visit Diagnosis:    ICD-10-CM   1. Schizoaffective disorder, bipolar type ([place]) F25.0 OLANZapine (ZYPREXA) 10 MG tablet    benztropine (COGENTIN) 0.5 MG tablet    traZODone (DESYREL) 100 MG tablet    Past Psychiatric History: Reviewed Patient has history of psychosis and manic symptoms. He is hospitalized 3-4 times and history of runaway from the group home. His last hospitalized and was in May 2016. Patient do not remember the details very well but reported he used to take Navane and Latuda. Patient denies any history of suicidal attempt or any self abusive behavior.  Past Medical History:  Past Medical History:  Diagnosis Date  . Anxiety   . Glaucoma   . HTN (hypertension) 11/01/2014  . Hypertension   . Schizo-affective schizophrenia ([place])   .  Schizoaffective disorder, manic type ([place]) 10/30/2014  . Tobacco use disorder 11/01/2014    Past Surgical History:  Procedure Laterality Date  . TONSILLECTOMY      Family Psychiatric History: Reviewed  Family History:  Family History  Problem Relation Age of Onset  . Aneurysm Mother   . Depression Mother   . CAD Father   . Kidney cancer Neg Hx   . Prostate cancer Neg Hx     Social History:  Social History   Socioeconomic History  . Marital status: Married    Spouse name: Not on file  . Number of children: Not on file  . Years of education: Not on file  . Highest education level: Not on file  Occupational History  . Not on file  Social Needs  . Financial resource strain: Not on file  . Food insecurity:    Worry: Not on file    Inability: Not on file  . Transportation needs:    Medical: Not on file    Non-medical: Not on file  Tobacco Use  . Smoking status: Current Every Day Smoker    Packs/day: 0.75    Years: 39.00    Pack years: 29.25    Types: Cigarettes  . Smokeless tobacco: Never Used  Substance and Sexual Activity  . Alcohol use: No    Alcohol/week: 0.0 oz  . Drug use: No  . Sexual  activity: Never  Lifestyle  . Physical activity:    Days per week: Not on file    Minutes per session: Not on file  . Stress: Not on file  Relationships  . Social connections:    Talks on phone: Not on file    Gets together: Not on file    Attends religious service: Not on file    Active member of club or organization: Not on file    Attends meetings of clubs or organizations: Not on file    Relationship status: Not on file  Other Topics Concern  . Not on file  Social History Narrative   From [facility].    Allergies: No Known Allergies  Metabolic Disorder Labs: Lab Results  Component Value Date   HGBA1C 6.1 (H) 12/13/2016   No results found for: PROLACTIN Lab Results  Component Value Date   CHOL 153 11/01/2014   TRIG 54 11/01/2014   HDL 67  11/01/2014   CHOLHDL 2.3 11/01/2014   VLDL 11 11/01/2014   LDLCALC 75 11/01/2014   Lab Results  Component Value Date   TSH 0.553 12/13/2016   TSH 0.749 11/01/2014    Therapeutic Level Labs: No results found for: LITHIUM No results found for: VALPROATE No components found for:  CBMZ  Current Medications: Current Outpatient Medications  Medication Sig Dispense Refill  . amLODipine (NORVASC) 10 MG tablet     . benztropine (COGENTIN) 0.5 MG tablet Take 1 tablet (0.5 mg total) by mouth 2 (two) times daily. 180 tablet 0  . dorzolamide-timolol (COSOPT) 22.3-6.8 MG/ML ophthalmic solution     . finasteride (PROSCAR) 5 MG tablet Take 1 tablet (5 mg total) by mouth daily. 90 tablet 3  . latanoprost (XALATAN) 0.005 % ophthalmic solution     . OLANZapine (ZYPREXA) 10 MG tablet Take 1 tablet (10 mg total) by mouth at bedtime. 90 tablet 0  . tamsulosin (FLOMAX) 0.4 MG CAPS capsule     . traZODone (DESYREL) 100 MG tablet Take 1 tablet (100 mg total) by mouth at bedtime. 90 tablet 0  . atorvastatin (LIPITOR) 10 MG tablet 10 mg.    . rosuvastatin (CRESTOR) 5 MG tablet Take 5 mg by mouth at bedtime.     No current facility-administered medications for this visit.      Musculoskeletal: Strength & Muscle Tone: within normal limits Gait & Station: normal Patient leans: N/A  Psychiatric Specialty Exam: ROS  Blood pressure 138/78, pulse 74, height 6\' 1"  (1.854 m), weight 208 lb (94.3 kg).Body mass index is 27.44 kg/m.  General Appearance: Casual  Eye Contact:  Fair  Speech:  Slow  Volume:  Normal  Mood:  Euthymic  Affect:  Appropriate  Thought Process:  Goal Directed  Orientation:  Full (Time, Place, and Person)  Thought Content: Logical   Suicidal Thoughts:  No  Homicidal Thoughts:  No  Memory:  Immediate;   Fair Recent;   Fair Remote;   Fair  Judgement:  Good  Insight:  Good  Psychomotor Activity:  Normal  Concentration:  Concentration: Fair and Attention Span: Fair  Recall:   [employer] of Knowledge: Fair  Language: Fair  Akathisia:  No  Handed:  Right  AIMS (if indicated): not done  Assets:  Desire for Improvement Housing Resilience Social Support  ADL's:  Intact  Cognition: Impaired,  Mild  Sleep:  Good   Screenings: AIMS     Admission (Discharged) from 10/30/2014 in [facility] Total  Score  0    AUDIT     Admission (Discharged) from 10/30/2014 in [facility]  Alcohol Use Disorder Identification Test Final Score (AUDIT)  0       Assessment and Plan: Schizoaffective disorder, bipolar type.  Patient is doing better on his current medication.  Patient is scheduled to see his primary care physician next month to have his blood work.  I recommended to have her blood work bring to [place] on his next appointment.  Follow-up in 3 months.  We will considered moving to appointment for 6 months in the future.  Continue Zyprexa 10 mg at bedtime, trazodone 100 mg at bedtime and Cogentin 0.5 mg twice a day.  Recommended to call us back if he has any question or any concern.  Follow-up 3 months.   Kathlee Nations, MD 09/14/2017, 10:36 AM

## 2017-12-14 ENCOUNTER — Ambulatory Visit (HOSPITAL_COMMUNITY): Payer: Self-pay | Admitting: Psychiatry

## 2018-01-27 ENCOUNTER — Ambulatory Visit (HOSPITAL_COMMUNITY): Payer: Medicare HMO | Admitting: Psychiatry

## 2018-03-28 ENCOUNTER — Encounter (HOSPITAL_COMMUNITY): Payer: Self-pay | Admitting: Psychiatry

## 2018-03-28 ENCOUNTER — Ambulatory Visit (INDEPENDENT_AMBULATORY_CARE_PROVIDER_SITE_OTHER): Payer: Medicare HMO | Admitting: Psychiatry

## 2018-03-28 VITALS — BP 122/80 | Ht 75.0 in | Wt 209.0 lb

## 2018-03-28 DIAGNOSIS — F250 Schizoaffective disorder, bipolar type: Secondary | ICD-10-CM

## 2018-03-28 DIAGNOSIS — F17210 Nicotine dependence, cigarettes, uncomplicated: Secondary | ICD-10-CM

## 2018-03-28 DIAGNOSIS — Z79899 Other long term (current) drug therapy: Secondary | ICD-10-CM

## 2018-03-28 MED ORDER — TRAZODONE HCL 100 MG PO TABS: 100.0000 mg | ORAL_TABLET | Freq: Every day | ORAL | 0 refills | Status: DC

## 2018-03-28 MED ORDER — OLANZAPINE 10 MG PO TABS: 10.0000 mg | ORAL_TABLET | Freq: Every day | ORAL | 0 refills | Status: DC

## 2018-03-28 MED ORDER — BENZTROPINE MESYLATE 0.5 MG PO TABS: 0.5000 mg | ORAL_TABLET | Freq: Two times a day (BID) | ORAL | 0 refills | Status: DC

## 2018-03-28 NOTE — Progress Notes (Signed)
[facility] MD/PA/NP OP Progress Note  03/28/2018 10:24 AM Ian Cook  MRN:  [id number]  Chief Complaint: I am doing fine.  I am taking medication.    HPI: Ian Cook came for his follow-up appointment with his group home staff.  As per staff patient is doing better he is not agitated aggressive or violent.  He is sleeping good but he continues to smoke a lot.  He is taking Zyprexa Cogentin and trazodone.  Denies any tremors or shakes.  He admitted some time he had voices but he is easily distracted them and he feels these voices does not bother him.  Patient denies any suicidal thoughts or homicidal thought.  He denies any feeling of hopelessness, crying spells or any worthlessness.  Patient was taking Haldol injection which was discontinued 1 year ago.  He was getting Haldol injection as a as needed for agitation.  Patient likes his group home and he denies any recent issues or any problem with the group home.  Patient has a history of running away from the group home.  Patient saw his primary care physician Dr. Carolin Cook he had blood work on October 9.  His creatinine kinase was 294, his total protein, AST and lipid profile was normal.  He is not taking a new medication for his hyperlipidemia.  There was no CMP and hemoglobin A 1C.  Patient like to continue his current medication.  He denies any drinking or using any illegal substances.  Visit Diagnosis:    ICD-10-CM   1. Encounter for long-term (current) use of medications Z79.899 Comprehensive metabolic panel    Hemoglobin A1c    Hemoglobin A1c    Comprehensive metabolic panel  2. Schizoaffective disorder, bipolar type ([place]) F25.0 traZODone (DESYREL) 100 MG tablet    OLANZapine (ZYPREXA) 10 MG tablet    benztropine (COGENTIN) 0.5 MG tablet    Past Psychiatric History: Viewed. Patient has history of psychosis and manic symptoms. He is hospitalized 3-4 times and history of runaway from the group home. His last hospitalized and was in May 2016. He was  given Haldol as needed injection for agitation while he was in group home.  Patient do not remember the details very well but reported he used to take Navane and Latuda. Patient denies any history of suicidal attempt or any self abusive behavior.  Past Medical History:  Past Medical History:  Diagnosis Date  . Anxiety   . Glaucoma   . HTN (hypertension) 11/01/2014  . Hypertension   . Schizo-affective schizophrenia ([place])   . Schizoaffective disorder, manic type ([place]) 10/30/2014  . Tobacco use disorder 11/01/2014    Past Surgical History:  Procedure Laterality Date  . TONSILLECTOMY      Family Psychiatric History: Reviewed  Family History:  Family History  Problem Relation Age of Onset  . Aneurysm Mother   . Depression Mother   . CAD Father   . Kidney cancer Neg Hx   . Prostate cancer Neg Hx     Social History:  Social History   Socioeconomic History  . Marital status: Married    Spouse name: Not on file  . Number of children: Not on file  . Years of education: Not on file  . Highest education level: Not on file  Occupational History  . Not on file  Social Needs  . Financial resource strain: Not on file  . Food insecurity:    Worry: Not on file    Inability: Not on file  . Transportation needs:  Medical: Not on file    Non-medical: Not on file  Tobacco Use  . Smoking status: Current Every Day Smoker    Packs/day: 0.75    Years: 39.00    Pack years: 29.25    Types: Cigarettes  . Smokeless tobacco: Never Used  Substance and Sexual Activity  . Alcohol use: No    Alcohol/week: 0.0 standard drinks  . Drug use: No  . Sexual activity: Never  Lifestyle  . Physical activity:    Days per week: Not on file    Minutes per session: Not on file  . Stress: Not on file  Relationships  . Social connections:    Talks on phone: Not on file    Gets together: Not on file    Attends religious service: Not on file    Active member of club or organization: Not on file     Attends meetings of clubs or organizations: Not on file    Relationship status: Not on file  Other Topics Concern  . Not on file  Social History Narrative   From [facility].    Allergies: No Known Allergies  Metabolic Disorder Labs: Lab Results  Component Value Date   HGBA1C 6.1 (H) 12/13/2016   No results found for: PROLACTIN Lab Results  Component Value Date   CHOL 153 11/01/2014   TRIG 54 11/01/2014   HDL 67 11/01/2014   CHOLHDL 2.3 11/01/2014   VLDL 11 11/01/2014   LDLCALC 75 11/01/2014   Lab Results  Component Value Date   TSH 0.553 12/13/2016   TSH 0.749 11/01/2014    Therapeutic Level Labs: No results found for: LITHIUM No results found for: VALPROATE No components found for:  CBMZ  Current Medications: Current Outpatient Medications  Medication Sig Dispense Refill  . amLODipine (NORVASC) 10 MG tablet     . benztropine (COGENTIN) 0.5 MG tablet Take 1 tablet (0.5 mg total) by mouth 2 (two) times daily. 180 tablet 0  . dorzolamide-timolol (COSOPT) 22.3-6.8 MG/ML ophthalmic solution     . finasteride (PROSCAR) 5 MG tablet Take 1 tablet (5 mg total) by mouth daily. 90 tablet 3  . latanoprost (XALATAN) 0.005 % ophthalmic solution     . OLANZapine (ZYPREXA) 10 MG tablet Take 1 tablet (10 mg total) by mouth at bedtime. 90 tablet 0  . rosuvastatin (CRESTOR) 5 MG tablet Take 5 mg by mouth at bedtime.    . tamsulosin (FLOMAX) 0.4 MG CAPS capsule     . traZODone (DESYREL) 100 MG tablet Take 1 tablet (100 mg total) by mouth at bedtime. 90 tablet 0   No current facility-administered medications for this visit.      Musculoskeletal: Strength & Muscle Tone: within normal limits Gait & Station: normal Patient leans: N/A  Psychiatric Specialty Exam: ROS  Blood pressure 122/80, height 6\' 3"  (1.905 m), weight 209 lb (94.8 kg).Body mass index is 26.12 kg/m.  General Appearance: Casual  Eye Contact:  Fair  Speech:  Slow  Volume:  Decreased  Mood:  Euthymic   Affect:  Restricted  Thought Process:  Descriptions of Associations: Intact  Orientation:  Full (Time, Place, and Person)  Thought Content: Logical   Suicidal Thoughts:  No  Homicidal Thoughts:  No  Memory:  Immediate;   Fair Recent;   Fair Remote;   Fair  Judgement:  Good  Insight:  Good  Psychomotor Activity:  Normal  Concentration:  Concentration: Fair and Attention Span: Fair  Recall:  [employer]  of Knowledge: Fair  Language: Fair  Akathisia:  No  Handed:  Right  AIMS (if indicated): not done  Assets:  Communication Skills Desire for Improvement Housing  ADL's:  Intact  Cognition: WNL  Sleep:  Good   Screenings: AIMS     Admission (Discharged) from 10/30/2014 in [facility] Total Score  0    AUDIT     Admission (Discharged) from 10/30/2014 in [facility]  Alcohol Use Disorder Identification Test Final Score (AUDIT)  0       Assessment and Plan: Schizoaffective disorder, bipolar type.  I reviewed his blood work results.  He did not have hemoglobin A1c and CMP.  We will do these blood works here.  Continue Zyprexa 10 mg at bedtime, Cogentin 0.5 mg ice a day and trazodone 100 mg at bedtime.  Discussed medication side effects and benefits.  Patient is not interested in counseling.  Recommended to call us back if he has any question or any concern.  Follow-up in 3 months.   Kathlee Nations, MD 03/28/2018, 10:24 AM

## 2018-03-31 LAB — COMPREHENSIVE METABOLIC PANEL
A/G RATIO: 1.7 (ref 1.2–2.2)
ALK PHOS: 66 IU/L (ref 39–117)
ALT: 23 IU/L (ref 0–44)
AST: 18 IU/L (ref 0–40)
Albumin: 4.5 g/dL (ref 3.5–5.5)
BILIRUBIN TOTAL: 0.4 mg/dL (ref 0.0–1.2)
BUN/Creatinine Ratio: 12 (ref 9–20)
BUN: 17 mg/dL (ref 6–24)
CHLORIDE: 101 mmol/L (ref 96–106)
CO2: 19 mmol/L — ABNORMAL LOW (ref 20–29)
Calcium: 9.4 mg/dL (ref 8.7–10.2)
Creatinine, Ser: 1.41 mg/dL — ABNORMAL HIGH (ref 0.76–1.27)
GFR calc non Af Amer: 54 mL/min/{1.73_m2} — ABNORMAL LOW (ref >59)
GFR, EST AFRICAN AMERICAN: 63 mL/min/{1.73_m2} (ref >59)
GLUCOSE: 108 mg/dL — AB (ref 65–99)
Globulin, Total: 2.6 g/dL (ref 1.5–4.5)
POTASSIUM: 4.3 mmol/L (ref 3.5–5.2)
Sodium: 138 mmol/L (ref 134–144)
TOTAL PROTEIN: 7.1 g/dL (ref 6.0–8.5)

## 2018-03-31 LAB — HEMOGLOBIN A1C
ESTIMATED AVERAGE GLUCOSE: 146 mg/dL
HEMOGLOBIN A1C: 6.7 % — AB (ref 4.8–5.6)

## 2018-06-27 ENCOUNTER — Encounter (HOSPITAL_COMMUNITY): Payer: Self-pay | Admitting: Psychiatry

## 2018-06-27 ENCOUNTER — Ambulatory Visit (HOSPITAL_COMMUNITY): Payer: Medicare HMO | Admitting: Psychiatry

## 2018-06-27 VITALS — BP 136/78 | Ht 74.0 in | Wt 212.0 lb

## 2018-06-27 DIAGNOSIS — F250 Schizoaffective disorder, bipolar type: Secondary | ICD-10-CM

## 2018-06-27 DIAGNOSIS — R4183 Borderline intellectual functioning: Secondary | ICD-10-CM

## 2018-06-27 MED ORDER — BENZTROPINE MESYLATE 0.5 MG PO TABS: 0.5000 mg | ORAL_TABLET | Freq: Two times a day (BID) | ORAL | 0 refills | Status: DC

## 2018-06-27 MED ORDER — TRAZODONE HCL 100 MG PO TABS: 100.0000 mg | ORAL_TABLET | Freq: Every day | ORAL | 0 refills | Status: DC

## 2018-06-27 MED ORDER — OLANZAPINE 5 MG PO TABS: 5.0000 mg | ORAL_TABLET | Freq: Every day | ORAL | 0 refills | Status: DC

## 2018-06-27 NOTE — Progress Notes (Signed)
[facility] MD/PA/NP OP Progress Note  06/27/2018 10:21 AM Ian Cook  MRN:  [id number]  Chief Complaint: Him doing fine.  I am sleeping good.  HPI: Ian Cook came for his follow-up appointment with staff of group home.  He is taking his medication as prescribed.  He is not involved in any agitation, anger, violent behavior.  He is taking Zyprexa, Cogentin and trazodone.  He had a good Christmas.  He denies any crying spells or any feeling of hopelessness or worthlessness.  He denies any paranoia or any hallucination.  I reviewed his blood work results.  His hemoglobin A1c is 6.7 which is increased year ago from 6.1.  He is going to see his primary care physician Dr. Carolin Coy and I recommended that he should discuss the blood work results with his primary care physician.  He has no tremors, shakes or any EPS.  His creatinine is 1.41.  Patient follows the rule of group home and there has been no issues.  He denies drinking or using any illegal substances.  He continues to smoke but we have recommended staff member to decrease his cigarette and lately staff is not giving the whole pack of cigarettes so is only smoking 2 to 4 cigarettes a day.  His energy level is okay.  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 Schizoaffective disorder, bipolar type ([facility]) F25.0 traZODone (DESYREL) 100 MG tablet    OLANZapine (ZYPREXA) 5 MG tablet    benztropine (COGENTIN) 0.5 MG tablet    Past Psychiatric History: Reviewed. History of psychosis, mania and running away from the group home. Multiple hospitalization last inpatient in May 2016. Given Haldol as needed for agitation in group home. History of taking Navane and Latuda. No history of suicidal attempt and self abusive behavior.  Past Medical History:  Past Medical History:  Diagnosis Date  . Anxiety   . Glaucoma   . HTN (hypertension) 11/01/2014  . Hypertension   . Schizo-affective  schizophrenia ([place])   . Schizoaffective disorder, manic type ([place]) 10/30/2014  . Tobacco use disorder 11/01/2014    Past Surgical History:  Procedure Laterality Date  . TONSILLECTOMY      Family Psychiatric History: Reviewed.  Family History:  Family History  Problem Relation Age of Onset  . Aneurysm Mother   . Depression Mother   . CAD Father   . Kidney cancer Neg Hx   . Prostate cancer Neg Hx     Social History:  Social History   Socioeconomic History  . Marital status: Married    Spouse name: Not on file  . Number of children: Not on file  . Years of education: Not on file  . Highest education level: Not on file  Occupational History  . Not on file  Social Needs  . Financial resource strain: Not on file  . Food insecurity:    Worry: Not on file    Inability: Not on file  . Transportation needs:    Medical: Not on file    Non-medical: Not on file  Tobacco Use  . Smoking status: Current Every Day Smoker    Packs/day: 0.75    Years: 39.00    Pack years: 29.25    Types: Cigarettes  . Smokeless tobacco: Never Used  Substance and Sexual Activity  . Alcohol use: No    Alcohol/week: 0.0 standard drinks  . Drug use: No  . Sexual activity: Never  Lifestyle  .  Physical activity:    Days per week: Not on file    Minutes per session: Not on file  . Stress: Not on file  Relationships  . Social connections:    Talks on phone: Not on file    Gets together: Not on file    Attends religious service: Not on file    Active member of club or organization: Not on file    Attends meetings of clubs or organizations: Not on file    Relationship status: Not on file  Other Topics Concern  . Not on file  Social History Narrative   From [facility].    Allergies: No Known Allergies  Metabolic Disorder Labs: Lab Results  Component Value Date   HGBA1C 6.7 (H) 03/28/2018   No results found for: PROLACTIN Lab Results  Component Value Date   CHOL 153 11/01/2014    TRIG 54 11/01/2014   HDL 67 11/01/2014   CHOLHDL 2.3 11/01/2014   VLDL 11 11/01/2014   LDLCALC 75 11/01/2014   Lab Results  Component Value Date   TSH 0.553 12/13/2016   TSH 0.749 11/01/2014    Therapeutic Level Labs: No results found for: LITHIUM No results found for: VALPROATE No components found for:  CBMZ  Current Medications: Current Outpatient Medications  Medication Sig Dispense Refill  . amLODipine (NORVASC) 10 MG tablet     . benztropine (COGENTIN) 0.5 MG tablet Take 1 tablet (0.5 mg total) by mouth 2 (two) times daily. 180 tablet 0  . dorzolamide-timolol (COSOPT) 22.3-6.8 MG/ML ophthalmic solution     . finasteride (PROSCAR) 5 MG tablet Take 1 tablet (5 mg total) by mouth daily. 90 tablet 3  . latanoprost (XALATAN) 0.005 % ophthalmic solution     . OLANZapine (ZYPREXA) 10 MG tablet Take 1 tablet (10 mg total) by mouth at bedtime. 90 tablet 0  . Pitavastatin Calcium 2 MG TABS Take 2 mg by mouth every evening.    . tamsulosin (FLOMAX) 0.4 MG CAPS capsule     . traZODone (DESYREL) 100 MG tablet Take 1 tablet (100 mg total) by mouth at bedtime. 90 tablet 0   No current facility-administered medications for this visit.      Musculoskeletal: Strength & Muscle Tone: within normal limits Gait & Station: normal Patient leans: N/A  Psychiatric Specialty Exam: ROS  Blood pressure 136/78, height 6\' 2"  (1.88 m), weight 212 lb (96.2 kg).Body mass index is 27.22 kg/m.  General Appearance: Casual  Eye Contact:  Fair  Speech:  Slow  Volume:  Decreased  Mood:  Euthymic  Affect:  Restricted  Thought Process:  Descriptions of Associations: Intact  Orientation:  Full (Time, Place, and Person)  Thought Content: poverty of thought content. No delusions or hallucination   Suicidal Thoughts:  No  Homicidal Thoughts:  No  Memory:  Immediate;   Fair Recent;   Fair Remote;   Fair  Judgement:  Fair  Insight:  Fair  Psychomotor Activity:  Normal  Concentration:  Concentration:  Fair and Attention Span: Fair  Recall:  [employer] of Knowledge: Fair  Language: Fair  Akathisia:  No  Handed:  Right  AIMS (if indicated): not done  Assets:  Desire for Improvement Housing Social Support  ADL's:  Intact  Cognition: WNL  Sleep:  Good   Screenings: AIMS     Admission (Discharged) from 10/30/2014 in [facility] Total Score  0    AUDIT     Admission (Discharged)  from 10/30/2014 in [facility]  Alcohol Use Disorder Identification Test Final Score (AUDIT)  0       Assessment and Plan: Schizoaffective disorder bipolar type.  Borderline intellect.  Reviewed his blood work results.  Recommended to decrease olanzapine to 5 mg at bedtime from 10 mg to help his blood sugar.  I also encouraged to discuss hemoglobin A1c results with the primary care physician as he has appointment next week.  Continue trazodone 100 mg at bedtime and Cogentin 0.5 mg twice a day.  I reminded that if reducing olanzapine caused any relapse of the symptoms specially irritability, poor sleep, agitation then staff should call our office immediately.  Treatment plan discussed with the patient and the staff of group home and they understand very well.  Recommended to call us back if there is any question or any concern.  I will see him again in 3 months.   Kathlee Nations, MD 06/27/2018, 10:21 AM

## 2018-09-19 ENCOUNTER — Other Ambulatory Visit: Payer: Self-pay

## 2018-09-19 ENCOUNTER — Ambulatory Visit (HOSPITAL_COMMUNITY): Payer: Medicare HMO | Admitting: Psychiatry

## 2018-09-19 ENCOUNTER — Ambulatory Visit (INDEPENDENT_AMBULATORY_CARE_PROVIDER_SITE_OTHER): Payer: Medicare HMO | Admitting: Psychiatry

## 2018-09-19 DIAGNOSIS — F17210 Nicotine dependence, cigarettes, uncomplicated: Secondary | ICD-10-CM | POA: Diagnosis not present

## 2018-09-19 DIAGNOSIS — R4183 Borderline intellectual functioning: Secondary | ICD-10-CM | POA: Diagnosis not present

## 2018-09-19 DIAGNOSIS — F250 Schizoaffective disorder, bipolar type: Secondary | ICD-10-CM | POA: Diagnosis not present

## 2018-09-19 MED ORDER — TRAZODONE HCL 100 MG PO TABS: 100.0000 mg | ORAL_TABLET | Freq: Every day | ORAL | 0 refills | Status: DC

## 2018-09-19 MED ORDER — OLANZAPINE 5 MG PO TABS: 5.0000 mg | ORAL_TABLET | Freq: Every day | ORAL | 0 refills | Status: DC

## 2018-09-19 MED ORDER — BENZTROPINE MESYLATE 0.5 MG PO TABS: 0.5000 mg | ORAL_TABLET | Freq: Every day | ORAL | 0 refills | Status: DC

## 2018-09-19 NOTE — Progress Notes (Signed)
Virtual Visit via Telephone Note  I connected with Ian Cook on 09/19/18 at 10:00 AM EDT by telephone and verified that I am speaking with the correct person using two identifiers.   I discussed the limitations, risks, security and privacy concerns of performing an evaluation and management service by telephone and the availability of in person appointments. I also discussed with the patient that there may be a patient responsible charge related to this service. The patient expressed understanding and agreed to proceed.   History of Present Illness: Patient was evaluated through phone session.  On his last visit we had cut down his Zyprexa from 10 mg to 5 mg and he is tolerating well.  He is sleeping good.  Patient and his staff member did not notice any change in his behavior.  As per staff he is not irritable, angry, mood swings or any paranoia.  He has not able to schedule appointment with his primary care physician because of pandemic coronavirus.  Patient reported no tremors.  He denies any crying spells or any feeling of hopelessness or worthlessness.  He reported his appetite is okay.  Denies any drinking alcohol or using any illegal substances which was confirmed by staff of the group home.  He continues to smoke but he limit his smoking to 2 to 4 cigarettes a day.   Past Psychiatric History: Reviewed. History of psychosis, mania and running away from the group home. Multiple hospitalization last inpatient in May 2016. Given Haldol as needed for agitation in group home. History of taking Navane and Latuda. No history of suicidal attempt and self abusive behavior.   Observations/Objective: Limited mental status admission done on the phone.  Patient describes his mood fine.  His speech is slow, monotonous but clear.  He usually responds to yes or no questions.  He denies any hallucination, paranoia, suicidal thoughts or homicidal thought.  He does not report any tremors or shakes.  His  attention and concentration is fair.  He is alert and oriented x3.  His cognition is below average.  His fund of knowledge is below average.  His insight judgment is fair.  Assessment and Plan: Schizoaffective disorder, bipolar type.  Borderline intellect.  Patient doing well since we cut down the olanzapine from 10 mg to 5 mg due to his high hemoglobin A1c.  However he has not seen his primary care physician due to pandemic coronavirus.  He is sleeping good.  I will continue trazodone 100 mg at bedtime, olanzapine 5 mg at bedtime and we will reduce Cogentin to take 0.5 mg once a day.  Treatment plan discussed with the staff at the group home.  I recommend to call us back if he has any question, concern or if he feels worsening of the symptom.  Follow-up in 3 months.  Follow Up Instructions:    I discussed the assessment and treatment plan with the patient. The patient was provided an opportunity to ask questions and all were answered. The patient agreed with the plan and demonstrated an understanding of the instructions.   The patient was advised to call back or seek an in-person evaluation if the symptoms worsen or if the condition fails to improve as anticipated.  I provided 20 minutes of non-face-to-face time during this encounter.   Kathlee Nations, MD

## 2018-10-02 ENCOUNTER — Telehealth (HOSPITAL_COMMUNITY): Payer: Self-pay

## 2018-10-02 DIAGNOSIS — F250 Schizoaffective disorder, bipolar type: Secondary | ICD-10-CM

## 2018-10-02 MED ORDER — BENZTROPINE MESYLATE 0.5 MG PO TABS: 0.5000 mg | ORAL_TABLET | Freq: Two times a day (BID) | ORAL | 0 refills | Status: DC

## 2018-10-02 NOTE — Telephone Encounter (Signed)
He can go back to previous dose of Cogentin 0.5 mg twice a day.  I called his prescription to [facility].

## 2018-10-02 NOTE — Telephone Encounter (Signed)
Patients wife called, patient is very fidgety and anxious since reducing the cogentin. She states patient is having a hard time sitting still and is constantly cleaning, pacing and fidgety. Please review and advise, thank you

## 2018-10-03 NOTE — Telephone Encounter (Signed)
I called patients social worker back and advised her via voicemail that the prescription changed back

## 2018-12-28 ENCOUNTER — Other Ambulatory Visit: Payer: Self-pay

## 2018-12-28 ENCOUNTER — Ambulatory Visit (HOSPITAL_COMMUNITY): Payer: Medicare HMO | Admitting: Psychiatry

## 2019-01-15 ENCOUNTER — Other Ambulatory Visit: Payer: Self-pay

## 2019-01-15 ENCOUNTER — Ambulatory Visit (INDEPENDENT_AMBULATORY_CARE_PROVIDER_SITE_OTHER): Payer: Medicare HMO | Admitting: Psychiatry

## 2019-01-15 ENCOUNTER — Encounter (HOSPITAL_COMMUNITY): Payer: Self-pay | Admitting: Psychiatry

## 2019-01-15 DIAGNOSIS — F250 Schizoaffective disorder, bipolar type: Secondary | ICD-10-CM

## 2019-01-15 DIAGNOSIS — R4183 Borderline intellectual functioning: Secondary | ICD-10-CM

## 2019-01-15 MED ORDER — OLANZAPINE 5 MG PO TABS: 5.0000 mg | ORAL_TABLET | Freq: Every day | ORAL | 0 refills | Status: DC

## 2019-01-15 MED ORDER — TRAZODONE HCL 100 MG PO TABS: 100.0000 mg | ORAL_TABLET | Freq: Every day | ORAL | 0 refills | Status: DC

## 2019-01-15 MED ORDER — BENZTROPINE MESYLATE 0.5 MG PO TABS: 0.5000 mg | ORAL_TABLET | Freq: Two times a day (BID) | ORAL | 0 refills | Status: DC

## 2019-01-15 NOTE — Progress Notes (Signed)
Virtual Visit via Telephone Note  I connected with Ian Cook on 01/15/19 at  1:00 PM EDT by telephone and verified that I am speaking with the correct person using two identifiers.   I discussed the limitations, risks, security and privacy concerns of performing an evaluation and management service by telephone and the availability of in person appointments. I also discussed with the patient that there may be a patient responsible charge related to this service. The patient expressed understanding and agreed to proceed.   History of Present Illness: Patient was evaluated through phone session.  Patient is a poor historian and most of the information was obtained through the group home administrator Ian Cook.  She reported patient is not agitated and aggressive since we have cut down the elanzepine 6 months ago.  He is sleeping good.  Sometimes difficult to manage him because of COVID as he wants to go outside.  Patient also reported that he is sleeping good.  He denies any tremors shakes or any EPS.  His appetite is okay.  He denies any hallucination, paranoia or any crying spells.  His appetite is okay.  He follows the staff and he is not been engaged in any incident.  He continues to smoke but he has cut down overall.  He likes staying at the group home.  He feels the staff is very supportive.  Recently he had a blood work at his primary care physician Dr. Jeanella Cook.  His hemoglobin A1c improved but we do not have the numbers.  His blood sugar medicine is lower.  He is compliant with olanzapine, Cogentin and trazodone.  We had cut down his Cogentin but he started to feel restless.  Patient denies drinking or using any illegal substances.   Past Psychiatric History:Reviewed. History of psychosis,maniaand running away from the group home.Multiple hospitalization lastinpatientin May 2016.Given Haldol as needed for agitationingroup home. History of takingNavane and Latuda. No history  ofsuicidal attemptandself abusive behavior.    Psychiatric Specialty Exam: Physical Exam  ROS  There were no vitals taken for this visit.There is no height or weight on file to calculate BMI.  General Appearance: NA  Eye Contact:  NA  Speech:  Slow  Volume:  Decreased  Mood:  Euthymic  Affect:  NA  Thought Process:  Descriptions of Associations: Intact  Orientation:  Full (Time, Place, and Person)  Thought Content:  poverty of thought content  Suicidal Thoughts:  No  Homicidal Thoughts:  No  Memory:  Immediate;   Fair Recent;   Fair Remote;   Fair  Judgement:  Fair  Insight:  Fair  Psychomotor Activity:  Decreased  Concentration:  Concentration: Fair and Attention Span: Fair  Recall:  [employer] of Knowledge:  Fair  Language:  Fair  Akathisia:  No  Handed:  Right  AIMS (if indicated):     Assets:  Desire for Improvement Housing Social Support  ADL's:  Intact  Cognition:  Impaired,  Mild  Sleep:   ok      Assessment and Plan: Schizoaffective disorder, bipolar type.  Borderline intellect.  Patient doing better on his medication.  He is compliant with olanzapine 5 mg, Cogentin 0.5 mg twice a day and trazodone 100 mg at bedtime.  Recommended to have his blood work results faxed to our office since recently he had blood work and his hemoglobin A1c is improved.  We do not have the results.  Discussed medication side effects and benefits.  Encourage healthy lifestyle and watch  his calorie intake.  Recommended to call us back if he has any question or any concern.  Follow-up in 3 months.  Follow Up Instructions:    I discussed the assessment and treatment plan with the patient. The patient was provided an opportunity to ask questions and all were answered. The patient agreed with the plan and demonstrated an understanding of the instructions.   The patient was advised to call back or seek an in-person evaluation if the symptoms worsen or if the condition fails to  improve as anticipated.  I provided 20 minutes of non-face-to-face time during this encounter.   Ian Nations, MD

## 2019-04-17 ENCOUNTER — Other Ambulatory Visit: Payer: Self-pay

## 2019-04-17 ENCOUNTER — Ambulatory Visit (INDEPENDENT_AMBULATORY_CARE_PROVIDER_SITE_OTHER): Payer: Medicare HMO | Admitting: Psychiatry

## 2019-04-17 ENCOUNTER — Other Ambulatory Visit (HOSPITAL_COMMUNITY): Payer: Self-pay

## 2019-04-17 ENCOUNTER — Encounter (HOSPITAL_COMMUNITY): Payer: Self-pay | Admitting: Psychiatry

## 2019-04-17 DIAGNOSIS — R4183 Borderline intellectual functioning: Secondary | ICD-10-CM

## 2019-04-17 DIAGNOSIS — F250 Schizoaffective disorder, bipolar type: Secondary | ICD-10-CM | POA: Diagnosis not present

## 2019-04-17 MED ORDER — BENZTROPINE MESYLATE 0.5 MG PO TABS: 0.5000 mg | ORAL_TABLET | Freq: Two times a day (BID) | ORAL | 0 refills | Status: DC

## 2019-04-17 MED ORDER — OLANZAPINE 5 MG PO TABS: 5.0000 mg | ORAL_TABLET | Freq: Every day | ORAL | 0 refills | Status: DC

## 2019-04-17 MED ORDER — TRAZODONE HCL 100 MG PO TABS: 100.0000 mg | ORAL_TABLET | Freq: Every day | ORAL | 0 refills | Status: DC

## 2019-04-17 NOTE — Progress Notes (Signed)
Virtual Visit via Telephone Note  I connected with Ian Cook on 04/17/19 at  1:20 PM EST by telephone and verified that I am speaking with the correct person using two identifiers.   I discussed the limitations, risks, security and privacy concerns of performing an evaluation and management service by telephone and the availability of in person appointments. I also discussed with the patient that there may be a patient responsible charge related to this service. The patient expressed understanding and agreed to proceed.   History of Present Illness: Patient was evaluated by phone session.  Patient is a poor historian and most of the information was obtained through group home staff.  I spoke to [place] who knows the patient very well.  He told that patient sometimes struggle with his mood and gets frustrated and irritable for no reason.  Especially when he has nothing to do he gets agitated.  However he is able to calm down and distract himself.  Lately he is not watching television as much.  He is not involved in any aggression, violence or any fighting with the staff member or other patients.  His appetite is okay and he sleeps good.  Patient denies any paranoia, hallucination, suicidal thoughts or any crying spells.  He has blood work done few months ago at primary care physician but we have not received any blood work results.  We have requested to fax [place] the labs.  Patient is taking the olanzapine, Cogentin and trazodone.  Patient denies drinking or using any illegal substances however he continues to smokes every day.      Past Psychiatric History:Reviewed. History of psychosis,maniaand running away from the group home.Multiple hospitalization lastinpatientin May 2016.Given Haldol as needed for agitationingroup home. History of takingNavane and Latuda. No history ofsuicidal attemptandself abusive behavior.    Psychiatric Specialty Exam: Physical Exam  ROS  There were no  vitals taken for this visit.There is no height or weight on file to calculate BMI.  General Appearance: NA  Eye Contact:  NA  Speech:  Slow  Volume:  Decreased  Mood:  Euthymic  Affect:  NA  Thought Process:  Descriptions of Associations: Intact  Orientation:  Full (Time, Place, and Person)  Thought Content:  poverty of thought content  Suicidal Thoughts:  No  Homicidal Thoughts:  No  Memory:  Immediate;   Fair Recent;   Fair Remote;   Fair  Judgement:  Fair  Insight:  Fair  Psychomotor Activity:  NA  Concentration:  Concentration: Fair and Attention Span: Fair  Recall:  [employer] of Knowledge:  Fair  Language:  Fair  Akathisia:  No  Handed:  Right  AIMS (if indicated):     Assets:  Desire for Improvement Housing  ADL's:  Intact  Cognition:  Impaired,  Mild  Sleep:   ok      Assessment and Plan: Schizo affect of disorder, bipolar type.  Borderline intellect.  One more time I requested the staff member to have blood work results faxed to [place].  I provided [phone] or fax number.  If his hemoglobin A1c is good and under control then we may consider increasing olanzapine to 7.5 mg.  I discussed medication side effects with the patient.  Recommended to call us back if is any question or any concern.  Follow-up in 3 months.  Follow Up Instructions:    I discussed the assessment and treatment plan with the patient. The patient was provided an opportunity to ask questions and  all were answered. The patient agreed with the plan and demonstrated an understanding of the instructions.   The patient was advised to call back or seek an in-person evaluation if the symptoms worsen or if the condition fails to improve as anticipated.  I provided 20 minutes of non-face-to-face time during this encounter.   Kathlee Nations, MD

## 2019-07-17 ENCOUNTER — Other Ambulatory Visit: Payer: Self-pay

## 2019-07-17 ENCOUNTER — Encounter (HOSPITAL_COMMUNITY): Payer: Self-pay | Admitting: Psychiatry

## 2019-07-17 ENCOUNTER — Ambulatory Visit (INDEPENDENT_AMBULATORY_CARE_PROVIDER_SITE_OTHER): Payer: Medicare HMO | Admitting: Psychiatry

## 2019-07-17 DIAGNOSIS — R4183 Borderline intellectual functioning: Secondary | ICD-10-CM | POA: Diagnosis not present

## 2019-07-17 DIAGNOSIS — F250 Schizoaffective disorder, bipolar type: Secondary | ICD-10-CM | POA: Diagnosis not present

## 2019-07-17 MED ORDER — OLANZAPINE 5 MG PO TABS: 5.0000 mg | ORAL_TABLET | Freq: Every day | ORAL | 0 refills | Status: DC

## 2019-07-17 MED ORDER — BENZTROPINE MESYLATE 0.5 MG PO TABS: 0.5000 mg | ORAL_TABLET | Freq: Every day | ORAL | 0 refills | Status: DC

## 2019-07-17 MED ORDER — TRAZODONE HCL 100 MG PO TABS: 100.0000 mg | ORAL_TABLET | Freq: Every day | ORAL | 0 refills | Status: DC

## 2019-07-17 NOTE — Progress Notes (Signed)
Virtual Visit via Telephone Note  I connected with Ian Cook on 07/17/19 at 10:20 AM EST by telephone and verified that I am speaking with the correct person using two identifiers.   I discussed the limitations, risks, security and privacy concerns of performing an evaluation and management service by telephone and the availability of in person appointments. I also discussed with the patient that there may be a patient responsible charge related to this service. The patient expressed understanding and agreed to proceed.   History of Present Illness: Patient was evaluated through phone session.  He is a poor historian and most of the information was obtained through group home staff member Raliegh Ip.  Staff told that patient has episodic agitation as they did not allow him to go outside due to Covid to mingle with other people.  But he is able to calm down and there has been no incident of violent behavior.  He is sleeping good.  His appetite is okay.  He recently seen endocrinologist for thyroid issue and he is taking methimazole.  Staff told that patient diagnosed with growth in thyroid and he will get another follow-up.  Patient denies any hallucination and he usually answer yes or no.  He does not report any suicidal thoughts.  He does not report any crying spells.  Reported sleep all night and do not recall having any episodic anger issues.  He is tolerating his medication and reported no side effects.  We have not received blood work results which was requested on the last visit.  We have provided our fax number but staff apologized not sending [place] the results.  Staff also reported to provide Haldol as needed for as needed agitation but I reinforced that we need current list of medication and blood work results before adding any other medication.   Past Psychiatric History:Reviewed. History of psychosis,maniaand running away from the group home.Multiple hospitalization lastinpatientin May  2016.Given Haldol as needed for agitationingroup home. History of takingNavane and Latuda. No history ofsuicidal attemptandself abusive behavior.   Psychiatric Specialty Exam: Physical Exam  Review of Systems  There were no vitals taken for this visit.There is no height or weight on file to calculate BMI.  General Appearance: NA  Eye Contact:  NA  Speech:  Slow  Volume:  Decreased  Mood:  Euthymic  Affect:  NA  Thought Process:  Descriptions of Associations: Intact  Orientation:  Full (Time, Place, and Person)  Thought Content:  poverty of thought content. No hallucination   Suicidal Thoughts:  No  Homicidal Thoughts:  No  Memory:  Immediate;   Fair Recent;   Fair Remote;   Fair  Judgement:  Fair  Insight:  Fair  Psychomotor Activity:  NA  Concentration:  Concentration: Fair and Attention Span: Fair  Recall:  [employer] of Knowledge:  Fair  Language:  Fair  Akathisia:  No  Handed:  Right  AIMS (if indicated):     Assets:  Desire for Improvement Housing  ADL's:  Intact  Cognition:  Impaired,  Mild  Sleep:   ok      Assessment and Plan: Schizoaffective disorder, bipolar type.  Borderline intellect.  Staff concerned about episodic agitation since new Covid rules and he cannot go outside and mingle with other people.  I suggested that he should verbally calm down rather than giving more medication.  And I also need to know his current list of medication and blood work results.  I have provided 1 more  time fax number so they can send [place] the current list of medication and blood work results.  For now we will not change any medication however if needed we can consider adding low-dose lorazepam for episodic agitation.  Continue trazodone 100 mg at bedtime, olanzapine 5 mg at bedtime and I will reduce Cogentin to 0.5 mg once a day since patient do not reported any tremors and does not need to take twice a day.  Recommended to call us back if is any question of any concern.   Follow-up in 3 months. Follow Up Instructions:    I discussed the assessment and treatment plan with the patient. The patient was provided an opportunity to ask questions and all were answered. The patient agreed with the plan and demonstrated an understanding of the instructions.   The patient was advised to call back or seek an in-person evaluation if the symptoms worsen or if the condition fails to improve as anticipated.  I provided 20 minutes of non-face-to-face time during this encounter.   Kathlee Nations, MD

## 2019-10-15 ENCOUNTER — Telehealth (INDEPENDENT_AMBULATORY_CARE_PROVIDER_SITE_OTHER): Payer: Medicare HMO | Admitting: Psychiatry

## 2019-10-15 ENCOUNTER — Other Ambulatory Visit: Payer: Self-pay

## 2019-10-15 ENCOUNTER — Encounter (HOSPITAL_COMMUNITY): Payer: Self-pay | Admitting: Psychiatry

## 2019-10-15 DIAGNOSIS — F250 Schizoaffective disorder, bipolar type: Secondary | ICD-10-CM

## 2019-10-15 DIAGNOSIS — R4183 Borderline intellectual functioning: Secondary | ICD-10-CM

## 2019-10-15 MED ORDER — BENZTROPINE MESYLATE 0.5 MG PO TABS: 0.5000 mg | ORAL_TABLET | Freq: Every day | ORAL | 0 refills | Status: DC

## 2019-10-15 MED ORDER — TRAZODONE HCL 100 MG PO TABS: 100.0000 mg | ORAL_TABLET | Freq: Every day | ORAL | 0 refills | Status: DC

## 2019-10-15 MED ORDER — OLANZAPINE 5 MG PO TABS: 5.0000 mg | ORAL_TABLET | Freq: Every day | ORAL | 0 refills | Status: DC

## 2019-10-15 NOTE — Progress Notes (Signed)
Virtual Visit via Telephone Note  I connected with Ian Cook on 10/15/19 at 10:20 AM EDT by telephone and verified that I am speaking with the correct person using two identifiers.   I discussed the limitations, risks, security and privacy concerns of performing an evaluation and management service by telephone and the availability of in person appointments. I also discussed with the patient that there may be a patient responsible charge related to this service. The patient expressed understanding and agreed to proceed.   History of Present Illness: Patient is evaluated by phone session.  He is a poor historian and most of the history was obtained through staff member at the group home [name].  He mentioned that his behavior is stable and he is not having agitation, anger or severe mood swings.  He sleeps good.  However they have been concerned about his weight loss.  He has seen his primary care physician Dr. Brantley Stage and recently blood work but according to physician labs are normal.  His thyroid was also normal as per staff member.  He is taking Zyprexa, trazodone and Cogentin.  He did not report any side effects.  I talked the patient briefly and he mentioned that he is taking the medication and does not have any side effects.  He also mentioned he is sleeping good.  There has been no incident that he had given any Haldol.  Patient and staff like to keep the current medication since patient is stable and does not have any hallucination, paranoia.  Past Psychiatric History:Reviewed. 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 H/O [place]. No h/o suicidal attemptandself abusive behavior.   Psychiatric Specialty Exam: Physical Exam  Review of Systems  Weight 168 lb (76.2 kg).There is no height or weight on file to calculate BMI.  General Appearance: NA   Eye Contact:  NA  Speech:  Slow  Volume:  Decreased  Mood:  Euthymic  Affect:  NA  Thought Process:  Goal Directed  Orientation:  Full (Time, Place, and Person)  Thought Content:  poverty of thought content  Suicidal Thoughts:  No  Homicidal Thoughts:  No  Memory:  Immediate;   Fair Recent;   Fair Remote;   Fair  Judgement:  Fair  Insight:  Fair  Psychomotor Activity:  NA  Concentration:  Concentration: Fair and Attention Span: Fair  Recall:  [employer] of Knowledge:  Fair  Language:  Fair  Akathisia:  No  Handed:  Right  AIMS (if indicated):     Assets:  Desire for Improvement Housing Social Support  ADL's:  Intact  Cognition:  Impaired,  Mild  Sleep:   ok      Assessment and Plan: Schizoaffective disorder, bipolar type.  Borderline intellect.  Patient is a stable on his current medication however staff is concerned about weight loss and they are closely working with PCP and doing blood work.  I have provided again fax number so they can send [place] the current list of medication and blood work results.  We have not received any lab work for a while.  Staff agree that they will fax the labs and current list of medication.  I will continue trazodone 100 mg at bedtime, olanzapine 5 mg at bedtime and Cogentin 0.5 mg once a day.  Recommended to call us back with any questions or any concerns.  Follow-up in 3 months.  Follow Up Instructions:  I discussed the assessment and treatment plan with the patient. The patient was provided an opportunity to ask questions and all were answered. The patient agreed with the plan and demonstrated an understanding of the instructions.   The patient was advised to call back or seek an in-person evaluation if the symptoms worsen or if the condition fails to improve as anticipated.  I provided 20 minutes of non-face-to-face time during this encounter.   Ian Nations, MD

## 2020-01-10 ENCOUNTER — Telehealth: Payer: Self-pay

## 2020-01-10 ENCOUNTER — Other Ambulatory Visit: Payer: Self-pay

## 2020-01-10 ENCOUNTER — Ambulatory Visit: Payer: Medicare HMO | Admitting: Dermatology

## 2020-01-10 DIAGNOSIS — L811 Chloasma: Secondary | ICD-10-CM | POA: Diagnosis not present

## 2020-01-10 MED ORDER — HYDROQUINONE 4 % EX CREA: TOPICAL_CREAM | CUTANEOUS | 1 refills | Status: DC

## 2020-01-10 NOTE — Telephone Encounter (Signed)
This is the only medication, but he can get a mix from [facility] which would be about $45-$50 if he wants - we can send in. (the Hydroquinone mix without Tretinoin)

## 2020-01-10 NOTE — Progress Notes (Signed)
   New Patient Visit  Subjective  Ian Cook is a 59 y.o. male who presents for the following: discoloration (of the face ). He tends to get out in the sun every morning for an hour or so according to his attendant. He has an attendant with him today who gives some history.  The following portions of the chart were reviewed this encounter and updated as appropriate:  Tobacco  Allergies  Meds  Problems  Med Hx  Surg Hx  Fam Hx     Review of Systems:  No other skin or systemic complaints except as noted in HPI or Assessment and Plan.  Objective  Well appearing patient in no apparent distress; mood and affect are within normal limits.  A focused examination was performed including the face. Relevant physical exam findings are noted in the Assessment and Plan.  Objective  Face: Reticulated hyperpigmented patches.   Images        Assessment & Plan  Melasma Face  Reviewed patient medications - melasma does not appear to be related to medications. Advised patient and care giver that condition is benign. Recommend sun protection daily.  Advised that the sun exposure will make his condition worse.  Therefore if he plans to continue sunbathing regularly, I do not nicely recommend treatment as it will counteract any treatment that we will prescribe. Advised the condition is not likely curable but can improve with treatment and sun avoidance and sun protection.  It will likely get worse during the summer whether he tries to get sun or not and tends to get slightly better in the winter with less sun exposure.  In one month (fall) start Hydroquinone 4% cream QD x 3 months.  hydroquinone 4 % cream - Face  If cost of hydroquinone is not reasonable, we will send in [facility] hydroquinone mix.  Return in about 5 months (around 06/11/2020).  Luther Redo, CMA, am acting as scribe for Sarina Ser, MD .  Documentation: I have reviewed the above documentation for  accuracy and completeness, and I agree with the above.  Sarina Ser, MD

## 2020-01-10 NOTE — Telephone Encounter (Signed)
Jeani Hawking from [place] called about patients prescription sent in today. She states this is not covered with his Medicaid and will be very expensive for patient.

## 2020-01-12 ENCOUNTER — Encounter: Payer: Self-pay | Admitting: Dermatology

## 2020-01-15 ENCOUNTER — Other Ambulatory Visit: Payer: Self-pay

## 2020-01-15 ENCOUNTER — Encounter (HOSPITAL_COMMUNITY): Payer: Self-pay | Admitting: Psychiatry

## 2020-01-15 ENCOUNTER — Telehealth (INDEPENDENT_AMBULATORY_CARE_PROVIDER_SITE_OTHER): Payer: Medicare HMO | Admitting: Psychiatry

## 2020-01-15 DIAGNOSIS — R4183 Borderline intellectual functioning: Secondary | ICD-10-CM

## 2020-01-15 DIAGNOSIS — F250 Schizoaffective disorder, bipolar type: Secondary | ICD-10-CM

## 2020-01-15 MED ORDER — TRAZODONE HCL 100 MG PO TABS: 100.0000 mg | ORAL_TABLET | Freq: Every day | ORAL | 0 refills | Status: DC

## 2020-01-15 MED ORDER — BENZTROPINE MESYLATE 0.5 MG PO TABS: 0.5000 mg | ORAL_TABLET | Freq: Every day | ORAL | 0 refills | Status: DC

## 2020-01-15 MED ORDER — OLANZAPINE 5 MG PO TABS: 5.0000 mg | ORAL_TABLET | Freq: Every day | ORAL | 0 refills | Status: DC

## 2020-01-15 NOTE — Telephone Encounter (Signed)
Spoke with patient's care taker today. He is limited to $60 monthly. At this time they can not afford those medications. Do you know anything/generic they could use that should be a lot cheaper for patient?

## 2020-01-15 NOTE — Progress Notes (Signed)
Virtual Visit via Telephone Note  I connected with Ian Cook on 01/15/20 at 10:20 AM EDT by telephone and verified that I am speaking with the correct person using two identifiers.  Location: Patient: Group Home Provider: Home office   I discussed the limitations, risks, security and privacy concerns of performing an evaluation and management service by telephone and the availability of in person appointments. I also discussed with the patient that there may be a patient responsible charge related to this service. The patient expressed understanding and agreed to proceed.   History of Present Illness: Patient is evaluated by phone session.  Staff from living facility helped as patient is a poor historian.  Patient is doing well and denies any agitation, anger, mood swings.  Recently he had developed skin rash and seeing dermatologist.  He does not go outside in the sun but he tried to walk as much within the house.  He is compliant with trazodone, Cogentin and olanzapine he has no tremor shakes or any EPS.  He is sleeping good.  As per staff he is following the rules and regulations.  Facility and denies any concerns or side effects.  His appetite is okay.  He has not given Haldol in more than 7 months.  Patient denies drinking or using any illegal substances.   Past Psychiatric History:Reviewed. 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 H/O [place]. No h/o suicidal attemptandself abusive behavior.   Psychiatric Specialty Exam: Physical Exam  Review of Systems  Skin: Positive for rash.    Weight 165 lb (74.8 kg).There is no height or weight on file to calculate BMI.  General Appearance: NA  Eye Contact:  NA  Speech:  Slow  Volume:  Decreased  Mood:  Euthymic  Affect:  NA  Thought Process:  Descriptions of Associations: Intact  Orientation:   Full (Time, Place, and Person)  Thought Content:  WDL  Suicidal Thoughts:  No  Homicidal Thoughts:  No  Memory:  Immediate;   Fair Recent;   Fair Remote;   Fair  Judgement:  Intact  Insight:  Present  Psychomotor Activity:  NA  Concentration:  Concentration: Fair and Attention Span: Fair  Recall:  Poor  Fund of Knowledge:  Fair  Language:  Fair  Akathisia:  No  Handed:  Right  AIMS (if indicated):     Assets:  Desire for Improvement Housing Social Support  ADL's:  Intact  Cognition:  Impaired,  Mild  Sleep:   ok      Assessment and Plan: Borderline intellect.  Schizoaffective disorder, bipolar type.  Patient is a stable on his current medication.  Recently seen by dermatologist for skin rash.  Blood work reviewed.  His WBC count is 4.5, glucose 76, BUN 12, creatinine 1.31, LFTs normal.  Labs are drawn on July 31, 2019.  I will continue trazodone 100 mg at bedtime, olanzapine 5 mg at bedtime and Cogentin 0.5 mg once a day.  Recommended to call us back if is any question or any concern.  Follow-up in 3 months.  Follow Up Instructions:    I discussed the assessment and treatment plan with the patient. The patient was provided an opportunity to ask questions and all were answered. The patient agreed with the plan and demonstrated an understanding of the instructions.   The patient was advised to call back or seek an in-person evaluation if the symptoms worsen or if  the condition fails to improve as anticipated.  I provided 20 minutes of non-face-to-face time during this encounter.   Kathlee Nations, MD

## 2020-01-15 NOTE — Telephone Encounter (Signed)
There are really no other options. I would recommend that he try to save up $50 and when he does, order the [facility] hydroquinone mix - and since he probably can wait until at least late fall to start, that gives him time to save up.  But - he can just wait til whenever he can afford. The [facility] mix will last several months, so he will not need to fill again for a while if he just uses a small thin amount.

## 2020-01-17 NOTE — Telephone Encounter (Signed)
Left a detailed message for Raliegh Ip (pts care taker) at [phone] regarding information per Dr. Nehemiah Massed.

## 2020-01-28 ENCOUNTER — Telehealth: Payer: Self-pay

## 2020-01-28 NOTE — Telephone Encounter (Signed)
Ian Cook called to let Dr Raliegh Ip know this pt has a allowance of only $66, so he can not afford $32 Hydroquinone rx

## 2020-04-16 ENCOUNTER — Other Ambulatory Visit: Payer: Self-pay

## 2020-04-16 ENCOUNTER — Encounter (HOSPITAL_COMMUNITY): Payer: Self-pay | Admitting: Psychiatry

## 2020-04-16 ENCOUNTER — Telehealth (INDEPENDENT_AMBULATORY_CARE_PROVIDER_SITE_OTHER): Payer: Medicare HMO | Admitting: Psychiatry

## 2020-04-16 DIAGNOSIS — F250 Schizoaffective disorder, bipolar type: Secondary | ICD-10-CM | POA: Diagnosis not present

## 2020-04-16 DIAGNOSIS — R4183 Borderline intellectual functioning: Secondary | ICD-10-CM | POA: Diagnosis not present

## 2020-04-16 MED ORDER — BENZTROPINE MESYLATE 0.5 MG PO TABS: 0.5000 mg | ORAL_TABLET | Freq: Every day | ORAL | 0 refills | Status: DC

## 2020-04-16 MED ORDER — OLANZAPINE 5 MG PO TABS: 5.0000 mg | ORAL_TABLET | Freq: Every day | ORAL | 0 refills | Status: DC

## 2020-04-16 MED ORDER — TRAZODONE HCL 100 MG PO TABS: 100.0000 mg | ORAL_TABLET | Freq: Every day | ORAL | 0 refills | Status: DC

## 2020-04-16 NOTE — Progress Notes (Signed)
Virtual Visit via Telephone Note  I connected with Ian Cook on 04/16/20 at  9:20 AM EST by telephone and verified that I am speaking with the correct person using two identifiers.  Location: Patient: Group Home Provider: Home Office   I discussed the limitations, risks, security and privacy concerns of performing an evaluation and management service by telephone and the availability of in person appointments. I also discussed with the patient that there may be a patient responsible charge related to this service. The patient expressed understanding and agreed to proceed.   History of Present Illness: Patient is evaluated by phone session.  Patient is a poor historian lives in a group home.  Staff from the group home helped for the session.  Patient denies any issues with the medication.  As per staff he is not agitated, having mood swings, insomnia and he is relatively calm and cooperative.  Patient denies any hallucinations or any paranoia.  He has no tremors shakes or any EPS.  Recently his physician cut down his thyroid medicine and he is going to have another blood work in few weeks.  As per staff has not given Haldol in more than 10 months.  Patient denies drinking or using any illegal substances.  Patient is comfortable taking the medication to help his psychiatric symptoms.  Past Psychiatric History:Reviewed. H/Opsychosis,maniaand running away from the group home.Multipleinpatient andlastinpatientin May 2016.Given Haldol as needed for agitationingroup home. H/OtakingNavane and Latuda. No h/osuicidal attemptandself abusive behavior.  Psychiatric Specialty Exam: Physical Exam  Review of Systems  Weight 165 lb (74.8 kg).There is no height or weight on file to calculate BMI.  General Appearance: NA  Eye Contact:  NA  Speech:  Slow  Volume:  Decreased  Mood:  Euthymic  Affect:  NA  Thought Process:  Descriptions of Associations: Intact  Orientation:  Full (Time,  Place, and Person)  Thought Content:  poverty of thought content. No paranoia or hallucination  Suicidal Thoughts:  No  Homicidal Thoughts:  No  Memory:  Immediate;   Fair Recent;   Fair Remote;   Fair  Judgement:  Intact  Insight:  Fair  Psychomotor Activity:  NA  Concentration:  Concentration: Fair and Attention Span: Fair  Recall:  [employer] of Knowledge:  Fair  Language:  Fair  Akathisia:  No  Handed:  Right  AIMS (if indicated):     Assets:  Communication Skills Desire for Improvement Housing Social Support  ADL's:  Intact  Cognition:  Impaired,  Mild  Sleep:   ok      Assessment and Plan: Schizoaffective disorder, bipolar type.  Borderline intellect.  Patient is a stable on his current medication.  Continue olanzapine 5 mg at bedtime, Cogentin 0.5 mg daily and trazodone 100 mg at bedtime.  Patient is not having any side effects.  I recommend to call us back if he has any question or any concern.  Follow-up in 3 months.  Follow Up Instructions:    I discussed the assessment and treatment plan with the patient. The patient was provided an opportunity to ask questions and all were answered. The patient agreed with the plan and demonstrated an understanding of the instructions.   The patient was advised to call back or seek an in-person evaluation if the symptoms worsen or if the condition fails to improve as anticipated.  I provided 16 minutes of non-face-to-face time during this encounter.   Kathlee Nations, MD

## 2020-06-25 ENCOUNTER — Ambulatory Visit: Payer: Medicare HMO | Admitting: Dermatology

## 2020-07-15 ENCOUNTER — Telehealth (INDEPENDENT_AMBULATORY_CARE_PROVIDER_SITE_OTHER): Payer: Medicare HMO | Admitting: Psychiatry

## 2020-07-15 ENCOUNTER — Encounter (HOSPITAL_COMMUNITY): Payer: Self-pay | Admitting: Psychiatry

## 2020-07-15 ENCOUNTER — Other Ambulatory Visit: Payer: Self-pay

## 2020-07-15 DIAGNOSIS — R4183 Borderline intellectual functioning: Secondary | ICD-10-CM

## 2020-07-15 DIAGNOSIS — F250 Schizoaffective disorder, bipolar type: Secondary | ICD-10-CM | POA: Diagnosis not present

## 2020-07-15 MED ORDER — OLANZAPINE 5 MG PO TABS: 5.0000 mg | ORAL_TABLET | Freq: Every day | ORAL | 0 refills | Status: DC

## 2020-07-15 MED ORDER — BENZTROPINE MESYLATE 0.5 MG PO TABS: 0.5000 mg | ORAL_TABLET | Freq: Every day | ORAL | 0 refills | Status: DC

## 2020-07-15 MED ORDER — TRAZODONE HCL 100 MG PO TABS: 100.0000 mg | ORAL_TABLET | Freq: Every day | ORAL | 0 refills | Status: DC

## 2020-07-15 NOTE — Progress Notes (Signed)
Virtual Visit via Telephone Note  I connected with Ian Cook on 07/15/20 at 10:20 AM EST by telephone and verified that I am speaking with the correct person using two identifiers.  Location: Patient: Group Home Provider: Home Office   I discussed the limitations, risks, security and privacy concerns of performing an evaluation and management service by telephone and the availability of in person appointments. I also discussed with the patient that there may be a patient responsible charge related to this service. The patient expressed understanding and agreed to proceed.   History of Present Illness: Patient is evaluated by phone session.  Staff from group home Mr. Raliegh Ip was present with him.  Patient is a poor historian but did not offer any complaint.  He has been taking his medication.  His Christmas was good because he was able to see his family member and cousin.  However he was keeping the distance and have a mask on his face.  Staff reported no major issues since he has been taking the medication.  He has no agitation, anger, mania or any psychosis.  He is sleeping good and compliant with the rules and regulation of the group home.  Sometimes he needs reminder and instruction to do things.  He is compliant with standard church activities.  He has not given any emergency Haldol for more than a year.  He has no tremors shakes or any EPS.  He is scheduled to have blood work in first week of March by his PCP Dr. Shearon Balo.  His appetite and weight is unchanged from the past.  Past Psychiatric History:Reviewed. H/Opsychosis,maniaand running away from the group home.Multipleinpatient andlastinpatientin May 2016.Given Haldol as needed for agitationingroup home. H/OtakingNavane and Latuda. No h/osuicidal attemptandself abusive behavior.   Psychiatric Specialty Exam: Physical Exam  Review of Systems  Weight 169 lb (76.7 kg).There is no height or weight on file to calculate  BMI.  General Appearance: NA  Eye Contact:  NA  Speech:  Slow  Volume:  Decreased  Mood:  Euthymic  Affect:  NA  Thought Process:  Descriptions of Associations: Intact  Orientation:  Full (Time, Place, and Person)  Thought Content:  poverty of thought content  Suicidal Thoughts:  No  Homicidal Thoughts:  No  Memory:  Immediate;   Fair Recent;   Fair Remote;   Fair  Judgement:  Fair  Insight:  Fair  Psychomotor Activity:  NA  Concentration:  Concentration: Fair and Attention Span: Fair  Recall:  [employer] of Knowledge:  Fair  Language:  Fair  Akathisia:  No  Handed:  Right  AIMS (if indicated):     Assets:  Desire for Improvement Housing Social Support  ADL's:  Intact  Cognition:  Impaired,  Mild  Sleep:   ok      Assessment and Plan: Schizoaffective disorder, bipolar type.  Borderline intellect.  Patient is a stable on his current medication.  Continue olanzapine 5 mg at bedtime, trazodone 100 mg at bedtime and Cogentin 0.5 mg daily.  Patient is scheduled to have blood work in first week of March.  Discussed medication side effects and benefits.  Recommended to call us back if is any question or any concern.  Follow Up Instructions:    I discussed the assessment and treatment plan with the patient. The patient was provided an opportunity to ask questions and all were answered. The patient agreed with the plan and demonstrated an understanding of the instructions.   The patient was  advised to call back or seek an in-person evaluation if the symptoms worsen or if the condition fails to improve as anticipated.  I provided 13 minutes of non-face-to-face time during this encounter.   Kathlee Nations, MD

## 2020-10-09 ENCOUNTER — Encounter (HOSPITAL_COMMUNITY): Payer: Self-pay | Admitting: Psychiatry

## 2020-10-09 ENCOUNTER — Other Ambulatory Visit: Payer: Self-pay

## 2020-10-09 ENCOUNTER — Telehealth (INDEPENDENT_AMBULATORY_CARE_PROVIDER_SITE_OTHER): Payer: Medicare HMO | Admitting: Psychiatry

## 2020-10-09 DIAGNOSIS — R4183 Borderline intellectual functioning: Secondary | ICD-10-CM

## 2020-10-09 DIAGNOSIS — F250 Schizoaffective disorder, bipolar type: Secondary | ICD-10-CM

## 2020-10-09 MED ORDER — BENZTROPINE MESYLATE 0.5 MG PO TABS: 0.5000 mg | ORAL_TABLET | Freq: Every day | ORAL | 0 refills | Status: DC

## 2020-10-09 MED ORDER — TRAZODONE HCL 100 MG PO TABS: 100.0000 mg | ORAL_TABLET | Freq: Every day | ORAL | 0 refills | Status: DC

## 2020-10-09 MED ORDER — OLANZAPINE 5 MG PO TABS: 5.0000 mg | ORAL_TABLET | Freq: Every day | ORAL | 0 refills | Status: DC

## 2020-10-09 NOTE — Progress Notes (Signed)
Virtual Visit via Telephone Note  I connected with Ian Cook on 10/09/20 at 10:20 AM EDT by telephone and verified that I am speaking with the correct person using two identifiers.  Location: Patient: Group Home Provider: [facility]   I discussed the limitations, risks, security and privacy concerns of performing an evaluation and management service by telephone and the availability of in person appointments. I also discussed with the patient that there may be a patient responsible charge related to this service. The patient expressed understanding and agreed to proceed.   History of Present Illness: Patient is evaluated by phone session.  His staff from the group home Mr. Raliegh Ip was also present in the session.  Patient is taking his medication and as per staff there has been no agitation, anger, mood swings or any issues.  He is following rules and routine of the group home.  There is no major complaint.  Sometime patient to spend more time washing his hands but no behavior problems.  Lately staff noticed that he has trouble emptying his bladder and he is scheduled to see urologist.  He is supposed to have blood work last month but it was not done and now scheduled in 2 weeks.  Patient reported no issues with the medication.  He denies any hallucination, paranoia or any suicidal thoughts.  His appetite is okay.  He continues to smoke 20 cigarettes a day.  He has not required any emergency Haldol medication for agitation.  He has no tremor or shakes.  He is scheduled to have follow-up with neurologist and his PCP Dr. Shearon Balo.  Home staff  Past Psychiatric History:Reviewed. H/Opsychosis,maniaand running away from the group home.Multipleinpatient andlastinpatientin May 2016.Given Haldol as needed for agitationingroup home. H/OtakingNavane and Latuda. No h/osuicidal attemptandself abusive behavior.  Psychiatric Specialty Exam: Physical Exam  Review of Systems  Weight 169  lb (76.7 kg).There is no height or weight on file to calculate BMI.  General Appearance: NA  Eye Contact:  NA  Speech:  Slow  Volume:  Decreased  Mood:  Euthymic  Affect:  NA  Thought Process:  Descriptions of Associations: Intact  Orientation:  Full (Time, Place, and Person)  Thought Content:  poerty of thought content  Suicidal Thoughts:  No  Homicidal Thoughts:  No  Memory:  Immediate;   Fair Recent;   Fair Remote;   Fair  Judgement:  Fair  Insight:  Fair  Psychomotor Activity:  NA  Concentration:  Concentration: Fair and Attention Span: Fair  Recall:  [employer] of Knowledge:  Fair  Language:  Fair  Akathisia:  No  Handed:  Right  AIMS (if indicated):     Assets:  Desire for Improvement Housing Social Support  ADL's:  Intact  Cognition:  Impaired,  Mild  Sleep:   ok      Assessment and Plan: Schizoaffective disorder, bipolar type.  Borderline intellect.  Patient stable on his current medication.  Patient does not want to change the medication because he feels it is helping his mood.  I recommend group home staff to have his blood work results faxed to [place].  He is scheduled to have blood work next week.  Recommended to call us back if is any question or concern of any worsening of the symptoms.  Follow-up in 3 months.  We will continue olanzapine 5 mg at bedtime, trazodone 100 mg at bedtime and Cogentin 0.5 mg daily.  Follow Up Instructions:    I discussed the assessment  and treatment plan with the patient. The patient was provided an opportunity to ask questions and all were answered. The patient agreed with the plan and demonstrated an understanding of the instructions.   The patient was advised to call back or seek an in-person evaluation if the symptoms worsen or if the condition fails to improve as anticipated.  I provided 18 minutes of non-face-to-face time during this encounter.   Kathlee Nations, MD

## 2020-11-10 DIAGNOSIS — E119 Type 2 diabetes mellitus without complications: Secondary | ICD-10-CM | POA: Diagnosis not present

## 2020-11-10 DIAGNOSIS — N189 Chronic kidney disease, unspecified: Secondary | ICD-10-CM | POA: Diagnosis not present

## 2020-11-12 ENCOUNTER — Telehealth (HOSPITAL_COMMUNITY): Payer: Self-pay | Admitting: *Deleted

## 2020-11-12 DIAGNOSIS — L309 Dermatitis, unspecified: Secondary | ICD-10-CM | POA: Diagnosis not present

## 2020-11-12 DIAGNOSIS — K219 Gastro-esophageal reflux disease without esophagitis: Secondary | ICD-10-CM | POA: Diagnosis not present

## 2020-11-12 DIAGNOSIS — L2082 Flexural eczema: Secondary | ICD-10-CM | POA: Diagnosis not present

## 2020-11-12 DIAGNOSIS — N189 Chronic kidney disease, unspecified: Secondary | ICD-10-CM | POA: Diagnosis not present

## 2020-11-12 DIAGNOSIS — I10 Essential (primary) hypertension: Secondary | ICD-10-CM | POA: Diagnosis not present

## 2020-11-12 DIAGNOSIS — J449 Chronic obstructive pulmonary disease, unspecified: Secondary | ICD-10-CM | POA: Diagnosis not present

## 2020-11-12 DIAGNOSIS — E119 Type 2 diabetes mellitus without complications: Secondary | ICD-10-CM | POA: Diagnosis not present

## 2020-11-12 DIAGNOSIS — E0590 Thyrotoxicosis, unspecified without thyrotoxic crisis or storm: Secondary | ICD-10-CM | POA: Diagnosis not present

## 2020-11-12 DIAGNOSIS — E7849 Other hyperlipidemia: Secondary | ICD-10-CM | POA: Diagnosis not present

## 2020-11-12 NOTE — Telephone Encounter (Signed)
Labs received from pt PCP at [facility]. Abnormals are as follows: Creatinine 1.31, Chloride 107, and LDL 112. Will scan to chart.

## 2021-01-13 DIAGNOSIS — H2589 Other age-related cataract: Secondary | ICD-10-CM | POA: Diagnosis not present

## 2021-01-13 DIAGNOSIS — H4421 Degenerative myopia, right eye: Secondary | ICD-10-CM | POA: Diagnosis not present

## 2021-01-13 DIAGNOSIS — H2511 Age-related nuclear cataract, right eye: Secondary | ICD-10-CM | POA: Diagnosis not present

## 2021-01-13 DIAGNOSIS — Z0100 Encounter for examination of eyes and vision without abnormal findings: Secondary | ICD-10-CM | POA: Diagnosis not present

## 2021-01-13 DIAGNOSIS — H401113 Primary open-angle glaucoma, right eye, severe stage: Secondary | ICD-10-CM | POA: Diagnosis not present

## 2021-01-14 ENCOUNTER — Encounter (HOSPITAL_COMMUNITY): Payer: Self-pay | Admitting: Psychiatry

## 2021-01-14 ENCOUNTER — Other Ambulatory Visit: Payer: Self-pay

## 2021-01-14 ENCOUNTER — Telehealth (INDEPENDENT_AMBULATORY_CARE_PROVIDER_SITE_OTHER): Payer: Medicare HMO | Admitting: Psychiatry

## 2021-01-14 DIAGNOSIS — R4183 Borderline intellectual functioning: Secondary | ICD-10-CM | POA: Diagnosis not present

## 2021-01-14 DIAGNOSIS — F250 Schizoaffective disorder, bipolar type: Secondary | ICD-10-CM | POA: Diagnosis not present

## 2021-01-14 MED ORDER — BENZTROPINE MESYLATE 0.5 MG PO TABS: 0.5000 mg | ORAL_TABLET | Freq: Every day | ORAL | 0 refills | Status: DC

## 2021-01-14 MED ORDER — OLANZAPINE 5 MG PO TABS: 5.0000 mg | ORAL_TABLET | Freq: Every day | ORAL | 0 refills | Status: DC

## 2021-01-14 MED ORDER — TRAZODONE HCL 100 MG PO TABS: 100.0000 mg | ORAL_TABLET | Freq: Every day | ORAL | 0 refills | Status: DC

## 2021-01-14 NOTE — Progress Notes (Signed)
Virtual Visit via Telephone Note  I connected with Ian Cook on 01/14/21 at 10:40 AM EDT by telephone and verified that I am speaking with the correct person using two identifiers.  Location: Patient: Group Home Provider: Home Office   I discussed the limitations, risks, security and privacy concerns of performing an evaluation and management service by telephone and the availability of in person appointments. I also discussed with the patient that there may be a patient responsible charge related to this service. The patient expressed understanding and agreed to proceed.   History of Present Illness: Patient is evaluated by phone session.  Patient is a poor historian but information was obtained from the staff of group home.  Patient is compliant with the medication and there has been no episodes of agitation, anger, mood swings, outburst reported.  He had a blood work on June 7 and his glucose was normal which is 85, BUN 16.  His creatinine was 1.31 and LDL 112.  Patient reported no side effects from the medication.  He continues to smoke 16 to 20 cigarettes a day.  He has not given any emergency Haldol medication as most of the time his behavior is cooperative and calm.  He is sleeping good.  His appetite is okay and his weight is unchanged from the past.    Past Psychiatric History: Reviewed. H/O psychosis, mania and running away from the group home. Multiple inpatient and last inpatient in May 2016. Given Haldol as needed for agitation in group home. H/O taking Navane and Latuda.  No h/o suicidal attempt and self abusive behavior.   Psychiatric Specialty Exam: Physical Exam  Review of Systems  Weight 170 lb (77.1 kg).There is no height or weight on file to calculate BMI.  General Appearance: NA  Eye Contact:  NA  Speech:  Slow  Volume:  Decreased  Mood:  Euthymic  Affect:  NA  Thought Process:  Descriptions of Associations: Intact  Orientation:  Full (Time, Place, and Person)   Thought Content:   no paranoia or hallucination  Suicidal Thoughts:  No  Homicidal Thoughts:  No  Memory:  Immediate;   Fair Recent;   Fair Remote;   Fair  Judgement:  Fair  Insight:  Fair  Psychomotor Activity:  NA  Concentration:  Concentration: Fair and Attention Span: Fair  Recall:  [employer] of Knowledge:  Fair  Language:  Fair  Akathisia:  No  Handed:  Right  AIMS (if indicated):     Assets:  Communication Skills Desire for Improvement Housing  ADL's:  Intact  Cognition:  Impaired,  Mild  Sleep:   ok      Assessment and Plan: Schizoaffective disorder, bipolar type.  Borderline intellect.  Patient is stable on his current medication.  Reviewed blood work results.  Continue olanzapine 5 mg at bedtime and trazodone 100 mg at bedtime with Cogentin 0.5 mg daily.  Recommended to call us back if is any question or any concern.  Follow-up in 3 months.  Follow Up Instructions:    I discussed the assessment and treatment plan with the patient. The patient was provided an opportunity to ask questions and all were answered. The patient agreed with the plan and demonstrated an understanding of the instructions.   The patient was advised to call back or seek an in-person evaluation if the symptoms worsen or if the condition fails to improve as anticipated.  I provided 21 minutes of non-face-to-face time during this encounter.   Dossie Der  Dorian Furnace, MD

## 2021-01-28 DIAGNOSIS — E0590 Thyrotoxicosis, unspecified without thyrotoxic crisis or storm: Secondary | ICD-10-CM | POA: Diagnosis not present

## 2021-01-28 DIAGNOSIS — E042 Nontoxic multinodular goiter: Secondary | ICD-10-CM | POA: Diagnosis not present

## 2021-02-06 DIAGNOSIS — I10 Essential (primary) hypertension: Secondary | ICD-10-CM | POA: Diagnosis not present

## 2021-02-06 DIAGNOSIS — N400 Enlarged prostate without lower urinary tract symptoms: Secondary | ICD-10-CM | POA: Diagnosis not present

## 2021-02-06 DIAGNOSIS — E119 Type 2 diabetes mellitus without complications: Secondary | ICD-10-CM | POA: Diagnosis not present

## 2021-02-10 ENCOUNTER — Telehealth (HOSPITAL_COMMUNITY): Payer: Self-pay | Admitting: *Deleted

## 2021-02-10 NOTE — Telephone Encounter (Signed)
Lab results received via fax from [facility] . All lab results- 02/05/21 to 02/10/21. Creatinine elevated @ 1.29, also HgBA1C @ 5.7. Prostate specific Ag flagged as high @ 4.5 mg/ml. Labs to be scanned into pt chart.

## 2021-02-13 DIAGNOSIS — I10 Essential (primary) hypertension: Secondary | ICD-10-CM | POA: Diagnosis not present

## 2021-02-13 DIAGNOSIS — E119 Type 2 diabetes mellitus without complications: Secondary | ICD-10-CM | POA: Diagnosis not present

## 2021-02-13 DIAGNOSIS — E0590 Thyrotoxicosis, unspecified without thyrotoxic crisis or storm: Secondary | ICD-10-CM | POA: Diagnosis not present

## 2021-02-13 DIAGNOSIS — Z1331 Encounter for screening for depression: Secondary | ICD-10-CM | POA: Diagnosis not present

## 2021-02-13 DIAGNOSIS — N400 Enlarged prostate without lower urinary tract symptoms: Secondary | ICD-10-CM | POA: Diagnosis not present

## 2021-02-13 DIAGNOSIS — E7849 Other hyperlipidemia: Secondary | ICD-10-CM | POA: Diagnosis not present

## 2021-02-13 DIAGNOSIS — K219 Gastro-esophageal reflux disease without esophagitis: Secondary | ICD-10-CM | POA: Diagnosis not present

## 2021-02-13 DIAGNOSIS — Z0001 Encounter for general adult medical examination with abnormal findings: Secondary | ICD-10-CM | POA: Diagnosis not present

## 2021-02-13 DIAGNOSIS — J449 Chronic obstructive pulmonary disease, unspecified: Secondary | ICD-10-CM | POA: Diagnosis not present

## 2021-02-27 DIAGNOSIS — N400 Enlarged prostate without lower urinary tract symptoms: Secondary | ICD-10-CM | POA: Diagnosis not present

## 2021-03-06 DIAGNOSIS — L309 Dermatitis, unspecified: Secondary | ICD-10-CM | POA: Diagnosis not present

## 2021-03-06 DIAGNOSIS — E7849 Other hyperlipidemia: Secondary | ICD-10-CM | POA: Diagnosis not present

## 2021-03-06 DIAGNOSIS — I10 Essential (primary) hypertension: Secondary | ICD-10-CM | POA: Diagnosis not present

## 2021-03-06 DIAGNOSIS — K219 Gastro-esophageal reflux disease without esophagitis: Secondary | ICD-10-CM | POA: Diagnosis not present

## 2021-03-06 DIAGNOSIS — N400 Enlarged prostate without lower urinary tract symptoms: Secondary | ICD-10-CM | POA: Diagnosis not present

## 2021-03-06 DIAGNOSIS — E119 Type 2 diabetes mellitus without complications: Secondary | ICD-10-CM | POA: Diagnosis not present

## 2021-03-06 DIAGNOSIS — E0590 Thyrotoxicosis, unspecified without thyrotoxic crisis or storm: Secondary | ICD-10-CM | POA: Diagnosis not present

## 2021-03-06 DIAGNOSIS — J449 Chronic obstructive pulmonary disease, unspecified: Secondary | ICD-10-CM | POA: Diagnosis not present

## 2021-03-06 DIAGNOSIS — N189 Chronic kidney disease, unspecified: Secondary | ICD-10-CM | POA: Diagnosis not present

## 2021-03-24 NOTE — Progress Notes (Signed)
03/25/21 10:41 AM   [name] 03/04/1961 [id number]  Referring provider:  [name], MD [place],  [place] [address] Chief Complaint  Patient presents with   Benign Prostatic Hypertrophy     HPI: Ian Cook is a 60 y.o.male who presents today for further evaluation of BPH and elevated PSA.   He was last seen in clinic on 03/26/2016 for his BPH with obstruction/LUTS.   At the time he was started on Flomax.   He was referred back due to elevated PSA. Recent PSA on 02/06/2021 was 4.5 and 4.8 on 02/27/2021.   He reports that his urinary symptoms are more frequent during the daytime.   He reports that he is unsure of whether prostate issues run in his family.    IPSS     Row Name 03/25/21 1000         International Prostate Symptom Score   How often have you had the sensation of not emptying your bladder? Less than 1 in 5     How often have you had to urinate less than every two hours? Almost always     How often have you found you stopped and started again several times when you urinated? Almost always     How often have you found it difficult to postpone urination? Almost always     How often have you had a weak urinary stream? Not at All     How often have you had to strain to start urination? Not at All     How many times did you typically get up at night to urinate? 2 Times     Total IPSS Score 18           Quality of Life due to urinary symptoms   If you were to spend the rest of your life with your urinary condition just the way it is now how would you feel about that? Mostly Satisfied              Score:  1-7 Mild 8-19 Moderate 20-35 Severe    PMH: Past Medical History:  Diagnosis Date   Anxiety    Glaucoma    HTN (hypertension) 11/01/2014   Hypertension    Schizo-affective schizophrenia ([place])    Schizoaffective disorder, manic type ([place]) 10/30/2014   Tobacco use disorder 11/01/2014    Surgical History: Past Surgical  History:  Procedure Laterality Date   TONSILLECTOMY      Home Medications:  Allergies as of 03/25/2021   No Known Allergies      Medication List        Accurate as of March 25, 2021 10:41 AM. If you have any questions, ask your nurse or doctor.          amLODipine 10 MG tablet Commonly known as: NORVASC   benztropine 0.5 MG tablet Commonly known as: COGENTIN Take 1 tablet (0.5 mg total) by mouth daily.   dorzolamide-timolol 22.3-6.8 MG/ML ophthalmic solution Commonly known as: COSOPT   finasteride 5 MG tablet Commonly known as: PROSCAR Take 1 tablet (5 mg total) by mouth daily.   hydroquinone 4 % cream Start in one month. Apply to face QD. Do not use longer than 3 months.   latanoprost 0.005 % ophthalmic solution Commonly known as: XALATAN   methimazole 5 MG tablet Commonly known as: TAPAZOLE Take 5 mg by mouth.   OLANZapine 5 MG tablet Commonly known as: ZYPREXA Take 1 tablet (5 mg total) by  mouth at bedtime.   Pitavastatin Calcium 2 MG Tabs Take 2 mg by mouth.   sulfamethoxazole-trimethoprim 800-160 MG tablet Commonly known as: BACTRIM DS   tamsulosin 0.4 MG Caps capsule Commonly known as: FLOMAX   traZODone 100 MG tablet Commonly known as: DESYREL Take 1 tablet (100 mg total) by mouth at bedtime.        Allergies: No Known Allergies  Family History: Family History  Problem Relation Age of Onset   Aneurysm Mother    Depression Mother    CAD Father    Kidney cancer Neg Hx    Prostate cancer Neg Hx     Social History:  reports that he has been smoking cigarettes. He has a 29.25 pack-year smoking history. He has never used smokeless tobacco. He reports that he does not drink alcohol and does not use drugs.   Physical Exam: BP 126/82   Pulse 72   Ht 6\' 2"  (1.88 m)   Wt 170 lb (77.1 kg)   BMI 21.83 kg/m   Constitutional:  Alert and oriented, No acute distress.  Accompanied by health care proxy today.   HEENT: [place] AT, moist mucus  membranes.  Trachea midline, no masses. Cardiovascular: No clubbing, cyanosis, or edema. Respiratory: Normal respiratory effort, no increased work of breathing. Rectal: Normal sphincter tone,  40  CC prostate, firm nodule at the right apex  Skin: No rashes, bruises or suspicious lesions. Neurologic: Grossly intact, no focal deficits, moving all 4 extremities. Psychiatric: Normal mood and affect.  Laboratory Data:  Lab Results  Component Value Date   CREATININE 1.41 (H) 03/28/2018     Lab Results  Component Value Date   HGBA1C 6.7 (H) 03/28/2018    Pertinent Imaging: Results for orders placed or performed in visit on 03/25/21  Bladder Scan (Post Void Residual) in office  Result Value Ref Range   Scan Result 17       Assessment & Plan:    Elevated PSA /prostate nodule - PSA has elevated with abnormal rectal exam  - We reviewed the implications of an elevated PSA and the uncertainty surrounding it. In general, a man's PSA increases with age and is produced by both normal and cancerous prostate tissue. Differential for elevated PSA is BPH, prostate cancer, infection, recent intercourse/ejaculation, prostate infarction, recent urethroscopic manipulation (foley placement/cystoscopy) and prostatitis. Management of an elevated PSA can include observation or prostate biopsy and wediscussed this in detail. We discussed that indications for prostate biopsy are defined by age and race specific PSA cutoffs as well as a PSA velocity of 0.75/year.  - We discussed prostate biopsy in detail including the procedure itself, the risks of blood in the urine, stool, and ejaculate, serious infection, and discomfort. He is willing to proceed with this as discussed.  2. BPH with obstruction/LUTS  - He is emptying adequately today  -continue flomax  Follow-up with prostate biopsy   I,Kailey Littlejohn,acting as a scribe for [name], MD.,have documented all relevant documentation on the  behalf of [name], MD,as directed by  [name], MD while in the presence of [name], MD.  I have reviewed the above documentation for accuracy and completeness, and I agree with the above.   [name], MD  [facility] [address] [place], [place] [address] [phone]

## 2021-03-25 ENCOUNTER — Encounter: Payer: Self-pay | Admitting: Urology

## 2021-03-25 ENCOUNTER — Ambulatory Visit (INDEPENDENT_AMBULATORY_CARE_PROVIDER_SITE_OTHER): Payer: Medicare HMO | Admitting: Urology

## 2021-03-25 ENCOUNTER — Other Ambulatory Visit: Payer: Self-pay

## 2021-03-25 VITALS — BP 126/82 | HR 72 | Ht 74.0 in | Wt 170.0 lb

## 2021-03-25 DIAGNOSIS — F209 Schizophrenia, unspecified: Secondary | ICD-10-CM | POA: Diagnosis not present

## 2021-03-25 DIAGNOSIS — N402 Nodular prostate without lower urinary tract symptoms: Secondary | ICD-10-CM

## 2021-03-25 DIAGNOSIS — R972 Elevated prostate specific antigen [PSA]: Secondary | ICD-10-CM

## 2021-03-25 DIAGNOSIS — N138 Other obstructive and reflux uropathy: Secondary | ICD-10-CM | POA: Diagnosis not present

## 2021-03-25 DIAGNOSIS — N401 Enlarged prostate with lower urinary tract symptoms: Secondary | ICD-10-CM

## 2021-03-25 LAB — BLADDER SCAN AMB NON-IMAGING: Scan Result: 17

## 2021-03-25 NOTE — Patient Instructions (Signed)

## 2021-03-26 LAB — MICROSCOPIC EXAMINATION: Bacteria, UA: NONE SEEN

## 2021-03-26 LAB — URINALYSIS, COMPLETE
Bilirubin, UA: NEGATIVE
Glucose, UA: NEGATIVE
Ketones, UA: NEGATIVE
Leukocytes,UA: NEGATIVE
Nitrite, UA: NEGATIVE
Protein,UA: NEGATIVE
Specific Gravity, UA: 1.015 (ref 1.005–1.030)
Urobilinogen, Ur: 1 mg/dL (ref 0.2–1.0)
pH, UA: 7 (ref 5.0–7.5)

## 2021-04-14 ENCOUNTER — Encounter (HOSPITAL_COMMUNITY): Payer: Self-pay | Admitting: Psychiatry

## 2021-04-14 ENCOUNTER — Other Ambulatory Visit: Payer: Self-pay

## 2021-04-14 ENCOUNTER — Telehealth (HOSPITAL_BASED_OUTPATIENT_CLINIC_OR_DEPARTMENT_OTHER): Payer: Medicare HMO | Admitting: Psychiatry

## 2021-04-14 DIAGNOSIS — R4183 Borderline intellectual functioning: Secondary | ICD-10-CM | POA: Diagnosis not present

## 2021-04-14 DIAGNOSIS — F250 Schizoaffective disorder, bipolar type: Secondary | ICD-10-CM

## 2021-04-14 MED ORDER — OLANZAPINE 5 MG PO TABS: 5.0000 mg | ORAL_TABLET | Freq: Every day | ORAL | 1 refills | Status: DC

## 2021-04-14 MED ORDER — BENZTROPINE MESYLATE 0.5 MG PO TABS: 0.5000 mg | ORAL_TABLET | Freq: Every day | ORAL | 1 refills | Status: DC

## 2021-04-14 MED ORDER — TRAZODONE HCL 100 MG PO TABS: 100.0000 mg | ORAL_TABLET | Freq: Every day | ORAL | 1 refills | Status: DC

## 2021-04-14 NOTE — Progress Notes (Signed)
Virtual Visit via Telephone Note  I connected with Ian Cook on 04/14/21 at 10:40 AM EST by telephone and verified that I am speaking with the correct person using two identifiers.  Location: Patient: Group Home Provider: Home Office   I discussed the limitations, risks, security and privacy concerns of performing an evaluation and management service by telephone and the availability of in person appointments. I also discussed with the patient that there may be a patient responsible charge related to this service. The patient expressed understanding and agreed to proceed.   History of Present Illness: Patient is evaluated by phone session.  He is a poor historian but able to express that he has been doing good on the medication.  He lives in a group home and the staff is very supportive.  Recently staff noticed that he has been somewhat restless.  He had a visit to a neurologist and biopsy recommended.  He has a high PSA.  He is having biopsy in few weeks.  He admitted some anxiety related to his physical condition but denies any paranoia, hallucination, anger, mood swings, depression or suicidal thoughts.  He sleeps good.  He has not given any emergency Haldol medication and most of the time his behavior is cooperative with the staff.  He denies any tremors or shakes.  His weight is stable.  He is getting along with roommate.  As per staff there has been no recent issues with his behavior.    Past Psychiatric History: Reviewed. H/O psychosis, mania and running away from the group home. Multiple inpatient and last inpatient in May 2016. Given Haldol as needed for agitation in group home. H/O taking Navane and Latuda.  No h/o suicidal attempt and self abusive behavior.   Psychiatric Specialty Exam: Physical Exam  Review of Systems  Weight 170 lb (77.1 kg).There is no height or weight on file to calculate BMI.  General Appearance: NA  Eye Contact:  NA  Speech:  Slow  Volume:  Decreased   Mood:  Anxious  Affect:  NA  Thought Process:  Descriptions of Associations: Intact  Orientation:  Full (Time, Place, and Person)  Thought Content:   poverty of thought content. No hallucination or paranoia.  Suicidal Thoughts:  No  Homicidal Thoughts:  No  Memory:  Immediate;   Fair Recent;   Fair Remote;   Fair  Judgement:  Fair  Insight:  Shallow  Psychomotor Activity:  NA  Concentration:  Concentration: Fair and Attention Span: Fair  Recall:  [employer] of Knowledge:  Fair  Language:  Fair  Akathisia:  No  Handed:  Right  AIMS (if indicated):     Assets:  Communication Skills Desire for Improvement Housing Social Support  ADL's:  Intact  Cognition:  WNL  Sleep:   ok      Assessment and Plan: Schizoaffective disorder, bipolar type.  Borderline intellect.  Reviewed blood work results.  His PSA 5.7, creatinine 1.29 and hemoglobin A1c 5.7 which was drawn recently.  Patient is scheduled to have a prostate biopsy.  Reassurance given.  Staff reported patient is a stable on his current medication.  We will continue trazodone 100 mg at bedtime, Cogentin 0.5 mg daily and olanzapine 5 mg at bedtime.  I recommended to call us back if there is a worsening of symptoms.  We will follow up in 6 months.  Follow Up Instructions:    I discussed the assessment and treatment plan with the patient. The patient was provided  an opportunity to ask questions and all were answered. The patient agreed with the plan and demonstrated an understanding of the instructions.   The patient was advised to call back or seek an in-person evaluation if the symptoms worsen or if the condition fails to improve as anticipated.  I provided 15 minutes of non-face-to-face time during this encounter.   Kathlee Nations, MD

## 2021-04-14 NOTE — Progress Notes (Signed)
   04/15/21  CC:  Chief Complaint  Patient presents with   Prostate Biopsy     HPI: Ian Cook is a 61 y.o. male with a personal history of elevated PSA and BPH, who presents today for a prostate biopsy.   Recent PSA on 02/06/2021 was 4.5 and 4.8 on 02/27/2021.   Abnormal rectal exam and nodule at the right apex.    Vitals:   04/15/21 1144  BP: 125/86  Pulse: 75   NED. A&Ox3.   No respiratory distress   Abd soft, NT, ND Normal external genitalia with patent urethral meatus  Prostate Biopsy Procedure   Informed consent was obtained after discussing risks/benefits of the procedure.  A time out was performed to ensure correct patient identity.  Pre-Procedure: - Last PSA Level: 4.8 - Gentamicin given prophylactically - Levaquin 500 mg administered PO -Transrectal Ultrasound performed revealing a 59.9 gm prostate -No significant hypoechoic or median lobe noted  Procedure: - Prostate block performed using 10 cc 1% lidocaine and biopsies taken from sextant areas, a total of 12 under ultrasound guidance. - Took additional biopsies from the right mid and the right apex  Post-Procedure: - Patient tolerated the procedure well - He was counseled to seek immediate medical attention if experiences any severe pain, significant bleeding, or fevers - Return in one week to discuss biopsy results  [name], MD

## 2021-04-15 ENCOUNTER — Ambulatory Visit: Payer: Medicare HMO | Admitting: Urology

## 2021-04-15 ENCOUNTER — Other Ambulatory Visit: Payer: Self-pay

## 2021-04-15 VITALS — BP 125/86 | HR 75 | Ht 75.0 in | Wt 170.0 lb

## 2021-04-15 DIAGNOSIS — R972 Elevated prostate specific antigen [PSA]: Secondary | ICD-10-CM | POA: Diagnosis not present

## 2021-04-15 DIAGNOSIS — C61 Malignant neoplasm of prostate: Secondary | ICD-10-CM | POA: Diagnosis not present

## 2021-04-15 DIAGNOSIS — Z298 Encounter for other specified prophylactic measures: Secondary | ICD-10-CM

## 2021-04-15 DIAGNOSIS — N402 Nodular prostate without lower urinary tract symptoms: Secondary | ICD-10-CM

## 2021-04-15 MED ORDER — GENTAMICIN SULFATE 40 MG/ML IJ SOLN
80.0000 mg | Freq: Once | INTRAMUSCULAR | Status: AC
  Administered 2021-04-15: 80 mg via INTRAMUSCULAR

## 2021-04-15 MED ORDER — LEVOFLOXACIN 500 MG PO TABS
500.0000 mg | ORAL_TABLET | Freq: Once | ORAL | Status: AC
Start: 2021-04-15 — End: 2021-04-15
  Administered 2021-04-15: 500 mg via ORAL

## 2021-04-15 NOTE — Progress Notes (Signed)
Patient has a legal guardian, according to [facility] contact social worker Davonna Belling [phone]. Number listed no longer states Davonna Belling and states to contact director Carey Bullocks (office [phone], cell# [phone]) for immediate assistance. Called and spoke with Candace and obtained verbal consent for patient to have procedure today. Details of the procedure and risks involved were given. Consent was given to go ahead with procedure. Update for patient's current Social Worker was also given, Earl Lagos [phone]. Candace states she will update her on patient's procedure and follow up appointment and stress the importance that she be present at that appointment.  DPR information updated today

## 2021-04-16 LAB — SURGICAL PATHOLOGY

## 2021-04-27 NOTE — Progress Notes (Signed)
04/28/21 3:57 PM   Ian Cook [birth year] [id number]  Referring provider:  [name], MD [place],  [place] [address] Chief Complaint  Patient presents with   Results     HPI: Ian Cook is a 60 y.o.male with a personal history of elevated PSA and BPH, who returns today for prostate biopsy results.  He underwent a prostate biopsy on 04/15/2021 that revealed Gleason 3+3=6 affecting 8 cores.   His most recent PSA was 4.8.   His healthcare proxy was contacted today via telephone and was present in the room during visit.   He is doing well today.        PMH: Past Medical History:  Diagnosis Date   Anxiety    Glaucoma    HTN (hypertension) 11/01/2014   Hypertension    Schizo-affective schizophrenia ([place])    Schizoaffective disorder, manic type ([place]) 10/30/2014   Tobacco use disorder 11/01/2014    Surgical History: Past Surgical History:  Procedure Laterality Date   TONSILLECTOMY      Home Medications:  Allergies as of 04/28/2021   No Known Allergies      Medication List        Accurate as of April 28, 2021  3:57 PM. If you have any questions, ask your nurse or doctor.          amLODipine 10 MG tablet Commonly known as: NORVASC   benztropine 0.5 MG tablet Commonly known as: COGENTIN Take 1 tablet (0.5 mg total) by mouth daily.   dorzolamide-timolol 22.3-6.8 MG/ML ophthalmic solution Commonly known as: COSOPT   finasteride 5 MG tablet Commonly known as: PROSCAR Take 1 tablet (5 mg total) by mouth daily.   latanoprost 0.005 % ophthalmic solution Commonly known as: XALATAN   methimazole 5 MG tablet Commonly known as: TAPAZOLE Take 5 mg by mouth.   OLANZapine 5 MG tablet Commonly known as: ZYPREXA Take 1 tablet (5 mg total) by mouth at bedtime.   tamsulosin 0.4 MG Caps capsule Commonly known as: FLOMAX   traZODone 100 MG tablet Commonly known as: DESYREL Take 1 tablet (100 mg total) by mouth at bedtime.         Allergies: No Known Allergies  Family History: Family History  Problem Relation Age of Onset   Aneurysm Mother    Depression Mother    CAD Father    Kidney cancer Neg Hx    Prostate cancer Neg Hx     Social History:  reports that he has been smoking cigarettes. He has a 29.25 pack-year smoking history. He has never used smokeless tobacco. He reports that he does not drink alcohol and does not use drugs.   Physical Exam: BP 137/90   Pulse 66   Ht 6\' 3"  (1.905 m)   Wt 170 lb (77.1 kg)   BMI 21.25 kg/m   Constitutional:  Alert and oriented, No acute distress. HEENT:  AT, moist mucus membranes.  Trachea midline, no masses. Cardiovascular: No clubbing, cyanosis, or edema. Respiratory: Normal respiratory effort, no increased work of breathing. Skin: No rashes, bruises or suspicious lesions. Neurologic: Grossly intact, no focal deficits, moving all 4 extremities. Psychiatric: Normal mood and affect.  Laboratory Data:  Lab Results  Component Value Date   CREATININE 1.41 (H) 03/28/2018    Lab Results  Component Value Date   HGBA1C 6.7 (H) 03/28/2018     Assessment & Plan:    Prostate cancer -Low risk prostate cancer, newly diagnosed.  Relatively high-volume disease.  -  The patient was counseled about the natural history of prostate cancer and the standard treatment options that are available for prostate cancer. It was explained to him how his age and life expectancy, clinical stage, Gleason score, and PSA affect his prognosis, the decision to proceed with additional staging studies, as well as how that information influences recommended treatment strategies. We discussed the roles for active surveillance, radiation therapy, surgical therapy, androgen deprivation, as well as ablative therapy options for the treatment of prostate cancer as appropriate to his individual cancer situation. We discussed the risks and benefits of these options with regard to their impact  on cancer control and also in terms of potential adverse events, complications, and impact on quality of life particularly related to urinary, bowel, and sexual function.   - Recommend active surveillance. He will return for MRI and PSA in 6 months  -Legal guardian was also present by telephone today and is agreeable with active surveillance.  Return in 6 months for MRI / PSA  I,Kailey Littlejohn,acting as a scribe for [name], MD.,have documented all relevant documentation on the behalf of [name], MD,as directed by  [name], MD while in the presence of [name], [place] [address] [place], [place] [address] [phone]

## 2021-04-28 ENCOUNTER — Ambulatory Visit (INDEPENDENT_AMBULATORY_CARE_PROVIDER_SITE_OTHER): Payer: Medicare HMO | Admitting: Urology

## 2021-04-28 ENCOUNTER — Other Ambulatory Visit: Payer: Self-pay

## 2021-04-28 VITALS — BP 137/90 | HR 66 | Ht 75.0 in | Wt 170.0 lb

## 2021-04-28 DIAGNOSIS — C61 Malignant neoplasm of prostate: Secondary | ICD-10-CM

## 2021-04-28 NOTE — Progress Notes (Signed)
Spoke with Ian Cook, patients social worker [phone]. Ian Cook was unable to make appointment today in person, Janett Billow was present on telephone during appointment for result discussion with patient today.

## 2021-04-28 NOTE — Patient Instructions (Signed)
Prostate MRI Prep: ? ?1- No ejaculation 48 hours prior to exam ? ?2- No food or drink or caffeine 4 hours prior to exam ? ?3- Fleets enema needs to be done 4 hours prior to exam  ? ?4- Urinate just prior to exam  ?

## 2021-05-15 DIAGNOSIS — N189 Chronic kidney disease, unspecified: Secondary | ICD-10-CM | POA: Diagnosis not present

## 2021-05-15 DIAGNOSIS — E1122 Type 2 diabetes mellitus with diabetic chronic kidney disease: Secondary | ICD-10-CM | POA: Diagnosis not present

## 2021-05-15 DIAGNOSIS — E7849 Other hyperlipidemia: Secondary | ICD-10-CM | POA: Diagnosis not present

## 2021-05-15 DIAGNOSIS — L309 Dermatitis, unspecified: Secondary | ICD-10-CM | POA: Diagnosis not present

## 2021-05-15 DIAGNOSIS — E119 Type 2 diabetes mellitus without complications: Secondary | ICD-10-CM | POA: Diagnosis not present

## 2021-05-15 DIAGNOSIS — L2082 Flexural eczema: Secondary | ICD-10-CM | POA: Diagnosis not present

## 2021-05-15 DIAGNOSIS — I10 Essential (primary) hypertension: Secondary | ICD-10-CM | POA: Diagnosis not present

## 2021-05-15 DIAGNOSIS — J449 Chronic obstructive pulmonary disease, unspecified: Secondary | ICD-10-CM | POA: Diagnosis not present

## 2021-05-15 DIAGNOSIS — K219 Gastro-esophageal reflux disease without esophagitis: Secondary | ICD-10-CM | POA: Diagnosis not present

## 2021-05-15 DIAGNOSIS — E0590 Thyrotoxicosis, unspecified without thyrotoxic crisis or storm: Secondary | ICD-10-CM | POA: Diagnosis not present

## 2021-06-04 DIAGNOSIS — E119 Type 2 diabetes mellitus without complications: Secondary | ICD-10-CM | POA: Diagnosis not present

## 2021-06-04 DIAGNOSIS — N189 Chronic kidney disease, unspecified: Secondary | ICD-10-CM | POA: Diagnosis not present

## 2021-06-05 DIAGNOSIS — J449 Chronic obstructive pulmonary disease, unspecified: Secondary | ICD-10-CM | POA: Diagnosis not present

## 2021-06-05 DIAGNOSIS — L2082 Flexural eczema: Secondary | ICD-10-CM | POA: Diagnosis not present

## 2021-06-05 DIAGNOSIS — C61 Malignant neoplasm of prostate: Secondary | ICD-10-CM | POA: Diagnosis not present

## 2021-06-05 DIAGNOSIS — L309 Dermatitis, unspecified: Secondary | ICD-10-CM | POA: Diagnosis not present

## 2021-06-05 DIAGNOSIS — E0590 Thyrotoxicosis, unspecified without thyrotoxic crisis or storm: Secondary | ICD-10-CM | POA: Diagnosis not present

## 2021-06-05 DIAGNOSIS — E7849 Other hyperlipidemia: Secondary | ICD-10-CM | POA: Diagnosis not present

## 2021-06-05 DIAGNOSIS — I10 Essential (primary) hypertension: Secondary | ICD-10-CM | POA: Diagnosis not present

## 2021-06-05 DIAGNOSIS — K219 Gastro-esophageal reflux disease without esophagitis: Secondary | ICD-10-CM | POA: Diagnosis not present

## 2021-06-05 DIAGNOSIS — N400 Enlarged prostate without lower urinary tract symptoms: Secondary | ICD-10-CM | POA: Diagnosis not present

## 2021-07-07 DIAGNOSIS — H2511 Age-related nuclear cataract, right eye: Secondary | ICD-10-CM | POA: Diagnosis not present

## 2021-07-07 DIAGNOSIS — H2589 Other age-related cataract: Secondary | ICD-10-CM | POA: Diagnosis not present

## 2021-07-07 DIAGNOSIS — H4421 Degenerative myopia, right eye: Secondary | ICD-10-CM | POA: Diagnosis not present

## 2021-07-07 DIAGNOSIS — H401113 Primary open-angle glaucoma, right eye, severe stage: Secondary | ICD-10-CM | POA: Diagnosis not present

## 2021-07-29 ENCOUNTER — Telehealth: Payer: Self-pay | Admitting: Urology

## 2021-07-29 NOTE — Telephone Encounter (Signed)
Earl Lagos with [employer] called and is wanting to know the results of pts biopsy.  She stated that she was present for the appt on 04/28/21 but she has misplaced the paperwork.  Janett Billow is pts Education officer, museum.

## 2021-07-31 NOTE — Telephone Encounter (Signed)
Left Vm to return call 

## 2021-08-11 DIAGNOSIS — E0590 Thyrotoxicosis, unspecified without thyrotoxic crisis or storm: Secondary | ICD-10-CM | POA: Diagnosis not present

## 2021-08-11 DIAGNOSIS — E042 Nontoxic multinodular goiter: Secondary | ICD-10-CM | POA: Diagnosis not present

## 2021-09-04 DIAGNOSIS — E119 Type 2 diabetes mellitus without complications: Secondary | ICD-10-CM | POA: Diagnosis not present

## 2021-09-04 DIAGNOSIS — E1122 Type 2 diabetes mellitus with diabetic chronic kidney disease: Secondary | ICD-10-CM | POA: Diagnosis not present

## 2021-09-04 DIAGNOSIS — R634 Abnormal weight loss: Secondary | ICD-10-CM | POA: Diagnosis not present

## 2021-09-04 DIAGNOSIS — J449 Chronic obstructive pulmonary disease, unspecified: Secondary | ICD-10-CM | POA: Diagnosis not present

## 2021-09-04 DIAGNOSIS — C61 Malignant neoplasm of prostate: Secondary | ICD-10-CM | POA: Diagnosis not present

## 2021-09-04 DIAGNOSIS — E0590 Thyrotoxicosis, unspecified without thyrotoxic crisis or storm: Secondary | ICD-10-CM | POA: Diagnosis not present

## 2021-09-04 DIAGNOSIS — E7849 Other hyperlipidemia: Secondary | ICD-10-CM | POA: Diagnosis not present

## 2021-09-04 DIAGNOSIS — F17210 Nicotine dependence, cigarettes, uncomplicated: Secondary | ICD-10-CM | POA: Diagnosis not present

## 2021-09-04 DIAGNOSIS — K219 Gastro-esophageal reflux disease without esophagitis: Secondary | ICD-10-CM | POA: Diagnosis not present

## 2021-09-04 DIAGNOSIS — I10 Essential (primary) hypertension: Secondary | ICD-10-CM | POA: Diagnosis not present

## 2021-09-28 DIAGNOSIS — F17210 Nicotine dependence, cigarettes, uncomplicated: Secondary | ICD-10-CM | POA: Diagnosis not present

## 2021-10-02 ENCOUNTER — Other Ambulatory Visit (HOSPITAL_COMMUNITY): Payer: Self-pay | Admitting: Psychiatry

## 2021-10-02 DIAGNOSIS — F250 Schizoaffective disorder, bipolar type: Secondary | ICD-10-CM

## 2021-10-12 ENCOUNTER — Telehealth (HOSPITAL_BASED_OUTPATIENT_CLINIC_OR_DEPARTMENT_OTHER): Payer: Medicare HMO | Admitting: Psychiatry

## 2021-10-12 ENCOUNTER — Encounter (HOSPITAL_COMMUNITY): Payer: Self-pay | Admitting: Psychiatry

## 2021-10-12 DIAGNOSIS — R4183 Borderline intellectual functioning: Secondary | ICD-10-CM

## 2021-10-12 DIAGNOSIS — F250 Schizoaffective disorder, bipolar type: Secondary | ICD-10-CM

## 2021-10-12 MED ORDER — OLANZAPINE 5 MG PO TABS: 5.0000 mg | ORAL_TABLET | Freq: Every day | ORAL | 1 refills | Status: DC

## 2021-10-12 MED ORDER — BENZTROPINE MESYLATE 0.5 MG PO TABS: 0.5000 mg | ORAL_TABLET | Freq: Every day | ORAL | 1 refills | Status: DC

## 2021-10-12 MED ORDER — TRAZODONE HCL 100 MG PO TABS: 100.0000 mg | ORAL_TABLET | Freq: Every day | ORAL | 1 refills | Status: DC

## 2021-10-12 NOTE — Progress Notes (Signed)
Virtual Visit via Telephone Note ? ?I connected with [name] on 10/12/21 at 10:40 AM EDT by telephone and verified that I am speaking with the correct person using two identifiers. ? ?Location: ?Patient: Group Home ?Provider: Home Office ?  ?I discussed the limitations, risks, security and privacy concerns of performing an evaluation and management service by telephone and the availability of in person appointments. I also discussed with the patient that there may be a patient responsible charge related to this service. The patient expressed understanding and agreed to proceed. ? ? ?History of Present Illness: ?Patient is evaluated by phone session.  He is taking his medication.  His medicines are supervised by staff at group home.  He denies any anger, paranoia, hallucination.  He is a poor historian but able to answer the question without any issues.  He sleeps good.  He feels very comfortable at group home.  He has not given any emergency medication Haldol.  His appetite is okay.  He has no thoughts to harm himself or others.  As per staff he is cooperative.  He denies any tremor or shakes or any EPS.  His primary care physician is Dr. Michail Sermon and recently had blood work and no new medication added.  He denies drinking or using any illegal substances. ? ?Past Psychiatric History: Reviewed. ?H/O psychosis, mania and running away from the group home. Multiple inpatient and last inpatient in May 2016. Given Haldol as needed for agitation in group home. H/O taking Navane and Latuda.  No h/o suicidal attempt and self abusive behavior.  ? ?Psychiatric Specialty Exam: ?Physical Exam  ?Review of Systems  ?Weight 175 lb (79.4 kg).There is no height or weight on file to calculate BMI.  ?General Appearance: NA  ?Eye Contact:  NA  ?Speech:  Slow  ?Volume:  Decreased  ?Mood:  Euthymic  ?Affect:  NA  ?Thought Process:  Descriptions of Associations: Intact  ?Orientation:  Full (Time, Place, and Person)  ?Thought Content:   WDL  ?Suicidal Thoughts:  No  ?Homicidal Thoughts:  No  ?Memory:  Immediate;   Fair ?Recent;   Fair ?Remote;   Fair  ?Judgement:  Fair  ?Insight:  Fair  ?Psychomotor Activity:  NA  ?Concentration:  Concentration: Fair and Attention Span: Fair  ?Recall:  Fair  ?Fund of Knowledge:  Fair  ?Language:  Fair  ?Akathisia:  No  ?Handed:  Right  ?AIMS (if indicated):     ?Assets:  Communication Skills ?Desire for Improvement ?Housing ?Social Support  ?ADL's:  Intact  ?Cognition:  WNL  ?Sleep:   ok  ? ? ? ? ?Assessment and Plan: ?Schizoaffective disorder, bipolar type.  Borderline intellect. ? ?Patient is stable on his current medication.  We will continue trazodone 100 mg at bedtime, Cogentin 0.5 mg daily and olanzapine 5 mg at bedtime.  Recommended to call us back if is any question or any concern.  Follow-up in 6 months. ? ?Follow Up Instructions: ? ?  ?I discussed the assessment and treatment plan with the patient. The patient was provided an opportunity to ask questions and all were answered. The patient agreed with the plan and demonstrated an understanding of the instructions. ?  ?The patient was advised to call back or seek an in-person evaluation if the symptoms worsen or if the condition fails to improve as anticipated. ? ?Collaboration of Care: Primary Care Provider AEB notes are available in epic to review. ? ?Patient/Guardian was advised Release of Information must be obtained prior to  any record release in order to collaborate their care with an outside provider. Patient/Guardian was advised if they have not already done so to contact the registration department to sign all necessary forms in order for [place] to release information regarding their care.  ? ?Consent: Patient/Guardian gives verbal consent for treatment and assignment of benefits for services provided during this visit. Patient/Guardian expressed understanding and agreed to proceed.   ? ?I provided 21 minutes of non-face-to-face time during this  encounter. ? ? ?Kathlee Nations, MD  ?

## 2021-10-13 DIAGNOSIS — H2589 Other age-related cataract: Secondary | ICD-10-CM | POA: Diagnosis not present

## 2021-10-13 DIAGNOSIS — H401113 Primary open-angle glaucoma, right eye, severe stage: Secondary | ICD-10-CM | POA: Diagnosis not present

## 2021-10-22 ENCOUNTER — Ambulatory Visit
Admission: RE | Admit: 2021-10-22 | Discharge: 2021-10-22 | Disposition: A | Discharge status: Home / Self Care | Payer: Medicare HMO | Source: Ambulatory Visit | Attending: Urology | Admitting: Urology

## 2021-10-22 DIAGNOSIS — C61 Malignant neoplasm of prostate: Secondary | ICD-10-CM | POA: Insufficient documentation

## 2021-10-22 DIAGNOSIS — K4090 Unilateral inguinal hernia, without obstruction or gangrene, not specified as recurrent: Secondary | ICD-10-CM | POA: Diagnosis not present

## 2021-10-22 DIAGNOSIS — N400 Enlarged prostate without lower urinary tract symptoms: Secondary | ICD-10-CM | POA: Diagnosis not present

## 2021-10-22 DIAGNOSIS — R590 Localized enlarged lymph nodes: Secondary | ICD-10-CM | POA: Diagnosis not present

## 2021-10-22 MED ORDER — GADOBUTROL 1 MMOL/ML IV SOLN
8.0000 mL | Freq: Once | INTRAVENOUS | Status: AC | PRN
  Administered 2021-10-22: 8 mL via INTRAVENOUS

## 2021-10-26 ENCOUNTER — Other Ambulatory Visit: Payer: Medicare HMO

## 2021-10-26 DIAGNOSIS — C61 Malignant neoplasm of prostate: Secondary | ICD-10-CM

## 2021-10-27 LAB — PSA: Prostate Specific Ag, Serum: 5.6 ng/mL — ABNORMAL HIGH (ref 0.0–4.0)

## 2021-10-28 ENCOUNTER — Ambulatory Visit (INDEPENDENT_AMBULATORY_CARE_PROVIDER_SITE_OTHER): Payer: Medicare HMO | Admitting: Urology

## 2021-10-28 VITALS — BP 130/75 | HR 80 | Ht 75.0 in | Wt 175.0 lb

## 2021-10-28 DIAGNOSIS — C61 Malignant neoplasm of prostate: Secondary | ICD-10-CM

## 2021-10-28 DIAGNOSIS — R972 Elevated prostate specific antigen [PSA]: Secondary | ICD-10-CM | POA: Diagnosis not present

## 2021-10-28 DIAGNOSIS — Z8546 Personal history of malignant neoplasm of prostate: Secondary | ICD-10-CM

## 2021-10-28 DIAGNOSIS — F209 Schizophrenia, unspecified: Secondary | ICD-10-CM | POA: Diagnosis not present

## 2021-10-28 NOTE — Progress Notes (Signed)
10/28/21 3:22 PM   Ian Cook [birth year] [id number]  Referring provider:  [name], MD [place],  [place] [address] Chief Complaint  Patient presents with   Prostate Cancer     HPI: Ian Cook is a 61 y.o.male with a personal history of BPH and prostate cancer  who presents today for a 6 month follow-up with PSA and MRI prior.   He had a rising PSA and abnormal rectal exam front nodule and right apex that prompted a prostate biopsy.   He underwent a prostate biopsy on 04/15/2021 that revealed Gleason 3+3=6 affecting 8 cores. His most recent PSA was 5.6 on 10/26/2021.     MRI on 10/22/2021 visualized two small PI-RADS category 3 lesions in the peripheral zone and mild benign prostatic hypertrophy.  He is doing well today he has no urinary complaints.   PSA trend:  05/13/2017 1.0 02/06/2021 4.5  03/04/2021 4.8 10/26/2021 5.6  PMH: Past Medical History:  Diagnosis Date   Anxiety    Glaucoma    HTN (hypertension) 11/01/2014   Hypertension    Schizo-affective schizophrenia ([place])    Schizoaffective disorder, manic type ([place]) 10/30/2014   Tobacco use disorder 11/01/2014    Surgical History: Past Surgical History:  Procedure Laterality Date   TONSILLECTOMY      Home Medications:  Allergies as of 10/28/2021   No Known Allergies      Medication List        Accurate as of Oct 28, 2021  3:22 PM. If you have any questions, ask your nurse or doctor.          amLODipine 10 MG tablet Commonly known as: NORVASC   benztropine 0.5 MG tablet Commonly known as: COGENTIN Take 1 tablet (0.5 mg total) by mouth daily.   dorzolamide-timolol 22.3-6.8 MG/ML ophthalmic solution Commonly known as: COSOPT   finasteride 5 MG tablet Commonly known as: PROSCAR Take 1 tablet (5 mg total) by mouth daily.   latanoprost 0.005 % ophthalmic solution Commonly known as: XALATAN   methimazole 5 MG tablet Commonly known as: TAPAZOLE Take 5 mg by mouth.    OLANZapine 5 MG tablet Commonly known as: ZYPREXA Take 1 tablet (5 mg total) by mouth at bedtime.   tamsulosin 0.4 MG Caps capsule Commonly known as: FLOMAX   traZODone 100 MG tablet Commonly known as: DESYREL Take 1 tablet (100 mg total) by mouth at bedtime.        Allergies:  No Known Allergies  Family History: Family History  Problem Relation Age of Onset   Aneurysm Mother    Depression Mother    CAD Father    Kidney cancer Neg Hx    Prostate cancer Neg Hx     Social History:  reports that he has been smoking cigarettes. He has a 29.25 pack-year smoking history. He has never used smokeless tobacco. He reports that he does not drink alcohol and does not use drugs.   Physical Exam: BP 130/75   Pulse 80   Ht '6\' 3"'$  (1.905 m)   Wt 175 lb (79.4 kg)   BMI 21.87 kg/m   Constitutional:  Alert and oriented, No acute distress. HEENT:  AT, moist mucus membranes.  Trachea midline, no masses. Cardiovascular: No clubbing, cyanosis, or edema. Respiratory: Normal respiratory effort, no increased work of breathing. Skin: No rashes, bruises or suspicious lesions. Neurologic: Grossly intact, no focal deficits, moving all 4 extremities. Psychiatric: Normal mood and affect.  Laboratory Data:  Lab Results  Component Value Date   CREATININE 1.41 (H) 03/28/2018   Lab Results  Component Value Date   HGBA1C 6.7 (H) 03/28/2018    Pertinent Imaging: CLINICAL DATA:  Gleason 3+3=6 prostate adenocarcinoma the right base, right mid gland, left lateral mid gland, left lateral apex, right lateral base, right lateral mid gland, and right lateral apex on biopsy from 03/15/2021. Elevated PSA   EXAM: MR PROSTATE WITHOUT AND WITH CONTRAST   TECHNIQUE: Multiplanar multisequence MRI images were obtained of the pelvis centered about the prostate. Pre and post contrast images were obtained.   CONTRAST:  107m GADAVIST GADOBUTROL 1 MMOL/ML IV SOLN   COMPARISON:  None Available.    FINDINGS: Prostate:   Region of interest # 1: PI-RADS category 3 lesion of the left anterior and left posterolateral peripheral zone at the base, with mild focally reduced T2 signal (image 11, series 5), minimally reduced ADC map activity (image 12, series 8), but no restricted diffusion or early enhancement. This measures 0.20 cc (1.1 by 0.5 by 0.5 cm).   Region of interest # 2: Small PI-RADS category 3 lesion the right posteromedial peripheral zone in the mid gland and apex, with focally reduced T2 signal (image 46, series 9) and equivocal E reduced ADC map activity. This measures 0.12 cc (0.7 by 0.3 by 0.5 cm).   Mild encapsulated nodularity in the transition zone compatible with benign prostatic hypertrophy.   Volume: 3D volumetric analysis: Prostate volume 42.08 cc (5.0 by 4.1 by 4.7 cm).   Transcapsular spread:  Absent   Seminal vesicle involvement: Absent   Neurovascular bundle involvement: Absent   Pelvic adenopathy: Absent   Bone metastasis: Absent   Other findings: Left groin hernia contains a loop of bowel.   IMPRESSION: 1. Two small PI-RADS category 3 lesions in the peripheral zone. [facility]. Mild benign prostatic hypertrophy. 3. Left groin hernia contains a loop of bowel.     Electronically Signed   By: WVan ClinesM.D.   On: 10/23/2021 07:17  I have personally reviewed the images and agree with radiologist interpretation.    Assessment & Plan:    Prostate cancer  - Low risk cancer. Relative high-volume disease on active surveillance - PSA continue to rise -MRI is relatively reassuring that there is no significant high-grade lesions, extracapsular extension or locally advanced disease - Recommend repeating PSA in 6 months if it continues to rise will recommend a repeat bipsy at 1 year of his diagnosis; he is agreeable this plan.   F/u 6 months with PSA, possible biopsy  IConley Rollsas a scribe for  AHollice Espy MD.,have documented all relevant documentation on the behalf of AHollice Espy MD,as directed by  AHollice Espy MD while in the presence of AHollice Espy MD.  I have reviewed the above documentation for accuracy and completeness, and I agree with the above.   AHollice Espy MD   [facility] [address] [place] [phone]

## 2021-10-28 NOTE — Patient Instructions (Signed)

## 2022-01-20 DIAGNOSIS — H2511 Age-related nuclear cataract, right eye: Secondary | ICD-10-CM | POA: Diagnosis not present

## 2022-01-20 DIAGNOSIS — H2589 Other age-related cataract: Secondary | ICD-10-CM | POA: Diagnosis not present

## 2022-01-20 DIAGNOSIS — H401113 Primary open-angle glaucoma, right eye, severe stage: Secondary | ICD-10-CM | POA: Diagnosis not present

## 2022-01-20 DIAGNOSIS — Z0100 Encounter for examination of eyes and vision without abnormal findings: Secondary | ICD-10-CM | POA: Diagnosis not present

## 2022-01-22 ENCOUNTER — Other Ambulatory Visit (HOSPITAL_COMMUNITY): Payer: Self-pay | Admitting: Psychiatry

## 2022-01-22 DIAGNOSIS — F250 Schizoaffective disorder, bipolar type: Secondary | ICD-10-CM

## 2022-03-03 DIAGNOSIS — E042 Nontoxic multinodular goiter: Secondary | ICD-10-CM | POA: Diagnosis not present

## 2022-03-03 DIAGNOSIS — E0590 Thyrotoxicosis, unspecified without thyrotoxic crisis or storm: Secondary | ICD-10-CM | POA: Diagnosis not present

## 2022-03-26 DIAGNOSIS — K219 Gastro-esophageal reflux disease without esophagitis: Secondary | ICD-10-CM | POA: Diagnosis not present

## 2022-03-26 DIAGNOSIS — C61 Malignant neoplasm of prostate: Secondary | ICD-10-CM | POA: Diagnosis not present

## 2022-03-26 DIAGNOSIS — J449 Chronic obstructive pulmonary disease, unspecified: Secondary | ICD-10-CM | POA: Diagnosis not present

## 2022-03-26 DIAGNOSIS — R634 Abnormal weight loss: Secondary | ICD-10-CM | POA: Diagnosis not present

## 2022-03-26 DIAGNOSIS — I10 Essential (primary) hypertension: Secondary | ICD-10-CM | POA: Diagnosis not present

## 2022-03-26 DIAGNOSIS — Z23 Encounter for immunization: Secondary | ICD-10-CM | POA: Diagnosis not present

## 2022-03-26 DIAGNOSIS — E119 Type 2 diabetes mellitus without complications: Secondary | ICD-10-CM | POA: Diagnosis not present

## 2022-03-26 DIAGNOSIS — E0590 Thyrotoxicosis, unspecified without thyrotoxic crisis or storm: Secondary | ICD-10-CM | POA: Diagnosis not present

## 2022-03-26 DIAGNOSIS — E7849 Other hyperlipidemia: Secondary | ICD-10-CM | POA: Diagnosis not present

## 2022-03-26 DIAGNOSIS — F17200 Nicotine dependence, unspecified, uncomplicated: Secondary | ICD-10-CM | POA: Diagnosis not present

## 2022-04-08 ENCOUNTER — Other Ambulatory Visit (HOSPITAL_COMMUNITY): Payer: Self-pay | Admitting: *Deleted

## 2022-04-08 ENCOUNTER — Other Ambulatory Visit (HOSPITAL_COMMUNITY): Payer: Self-pay | Admitting: Psychiatry

## 2022-04-08 DIAGNOSIS — F250 Schizoaffective disorder, bipolar type: Secondary | ICD-10-CM

## 2022-04-08 DIAGNOSIS — R4183 Borderline intellectual functioning: Secondary | ICD-10-CM

## 2022-04-08 MED ORDER — TRAZODONE HCL 100 MG PO TABS: 100.0000 mg | ORAL_TABLET | Freq: Every day | ORAL | 0 refills | Status: DC

## 2022-04-08 MED ORDER — OLANZAPINE 5 MG PO TABS: 5.0000 mg | ORAL_TABLET | Freq: Every day | ORAL | 0 refills | Status: DC

## 2022-04-09 DIAGNOSIS — I10 Essential (primary) hypertension: Secondary | ICD-10-CM | POA: Diagnosis not present

## 2022-04-09 DIAGNOSIS — E0590 Thyrotoxicosis, unspecified without thyrotoxic crisis or storm: Secondary | ICD-10-CM | POA: Diagnosis not present

## 2022-04-09 DIAGNOSIS — E119 Type 2 diabetes mellitus without complications: Secondary | ICD-10-CM | POA: Diagnosis not present

## 2022-04-09 DIAGNOSIS — E7849 Other hyperlipidemia: Secondary | ICD-10-CM | POA: Diagnosis not present

## 2022-04-09 DIAGNOSIS — C61 Malignant neoplasm of prostate: Secondary | ICD-10-CM | POA: Diagnosis not present

## 2022-04-09 DIAGNOSIS — J449 Chronic obstructive pulmonary disease, unspecified: Secondary | ICD-10-CM | POA: Diagnosis not present

## 2022-04-09 DIAGNOSIS — F17200 Nicotine dependence, unspecified, uncomplicated: Secondary | ICD-10-CM | POA: Diagnosis not present

## 2022-04-09 DIAGNOSIS — N189 Chronic kidney disease, unspecified: Secondary | ICD-10-CM | POA: Diagnosis not present

## 2022-04-09 DIAGNOSIS — K219 Gastro-esophageal reflux disease without esophagitis: Secondary | ICD-10-CM | POA: Diagnosis not present

## 2022-04-13 ENCOUNTER — Telehealth (HOSPITAL_COMMUNITY): Payer: Self-pay | Admitting: Psychiatry

## 2022-04-14 ENCOUNTER — Telehealth (HOSPITAL_BASED_OUTPATIENT_CLINIC_OR_DEPARTMENT_OTHER): Payer: Medicare HMO | Admitting: Psychiatry

## 2022-04-14 ENCOUNTER — Other Ambulatory Visit (HOSPITAL_COMMUNITY): Payer: Self-pay | Admitting: *Deleted

## 2022-04-14 ENCOUNTER — Encounter (HOSPITAL_COMMUNITY): Payer: Self-pay | Admitting: Psychiatry

## 2022-04-14 DIAGNOSIS — R4183 Borderline intellectual functioning: Secondary | ICD-10-CM | POA: Diagnosis not present

## 2022-04-14 DIAGNOSIS — F250 Schizoaffective disorder, bipolar type: Secondary | ICD-10-CM

## 2022-04-14 MED ORDER — TRAZODONE HCL 100 MG PO TABS: 100.0000 mg | ORAL_TABLET | Freq: Every day | ORAL | 1 refills | Status: DC

## 2022-04-14 MED ORDER — BENZTROPINE MESYLATE 0.5 MG PO TABS: 0.5000 mg | ORAL_TABLET | Freq: Every day | ORAL | 1 refills | Status: DC

## 2022-04-14 MED ORDER — OLANZAPINE 5 MG PO TABS: 5.0000 mg | ORAL_TABLET | Freq: Every day | ORAL | 1 refills | Status: DC

## 2022-04-14 NOTE — Progress Notes (Signed)
Virtual Visit via Telephone Note  I connected with Ian Cook on 04/14/22 at 10:00 AM EST by telephone and verified that I am speaking with the correct person using two identifiers.  Location: Patient: Group Home Provider: Home Office   I discussed the limitations, risks, security and privacy concerns of performing an evaluation and management service by telephone and the availability of in person appointments. I also discussed with the patient that there may be a patient responsible charge related to this service. The patient expressed understanding and agreed to proceed.   History of Present Illness: Patient is evaluated by phone session.  He reported things are going well.  He do not know the name of the medication but he is compliant with the medication.  I spoke to the staff Izora Gala at the group home also reported that patient has no issue.  Patient is a poor historian but able to answer the questions when asked about anger, hallucination, suicidal thoughts.  He denies any paranoia, voices, feeling sad depressed or having any suicidal thoughts.  He was not given any Haldol emergency medication.  He sleeps good.  He continues to smoke and he has no plan to quit.  Recently saw urology and as per staff having procedures coming up on November 21.  His PCP is Dr. Michail Sermon.  I requested the staff from the group home to have his blood work results especially blood sugar faxed to [place].  Provided the fax number of our office.  Patient do not recall any tremors, shakes or any EPS.  He is getting along with the staff.  He denies drinking or using any illegal substances.  Past Psychiatric History: Reviewed. H/O psychosis, mania and running away from the group home. Multiple inpatient and last inpatient in May 2016. Given Haldol as needed for agitation in group home. H/O taking Navane and Latuda.  No h/o suicidal attempt and self abusive behavior.   Psychiatric Specialty Exam: Physical Exam  Review of  Systems  Weight 175 lb (79.4 kg).Body mass index is 21.87 kg/m.  General Appearance: NA  Eye Contact:  NA  Speech:  Slow  Volume:  Decreased  Mood:  Euthymic  Affect:  NA  Thought Process:  Descriptions of Associations: Intact  Orientation:  Full (Time, Place, and Person)  Thought Content:   poverty of thought content but logical  Suicidal Thoughts:  No  Homicidal Thoughts:  No  Memory:  Immediate;   Fair Recent;   Fair Remote;   Fair  Judgement:  Fair  Insight:  Fair  Psychomotor Activity:  NA  Concentration:  Concentration: Fair and Attention Span: Fair  Recall:  [employer] of Knowledge:  Fair  Language:  Fair  Akathisia:  No  Handed:  Right  AIMS (if indicated):     Assets:  Communication Skills Desire for Improvement Housing  ADL's:  Intact  Cognition:  WNL  Sleep:   ok      Assessment and Plan: Schizoaffective disorder, bipolar type.  Borderline intellect.  Patient is stable on current medication.  Continue trazodone 100 mg at bedtime, Cogentin 0.5 mg daily and olanzapine 5 mg at bedtime.  I spoke to the [place] who is staff at the group home to request recent blood work results including CMP, CBC.  Patient is on olanzapine and discussed side effect specially possibility of metabolic syndrome.  Patient does not want to change the medication since it is working well.  We will follow-up in 6 months.  Recommended  to call us back if is any question or any concern.  Follow Up Instructions:    I discussed the assessment and treatment plan with the patient. The patient was provided an opportunity to ask questions and all were answered. The patient agreed with the plan and demonstrated an understanding of the instructions.   The patient was advised to call back or seek an in-person evaluation if the symptoms worsen or if the condition fails to improve as anticipated.  Collaboration of Care: Other provider involved in patient's care AEB notes are available in epic to  review.  Patient/Guardian was advised Release of Information must be obtained prior to any record release in order to collaborate their care with an outside provider. Patient/Guardian was advised if they have not already done so to contact the registration department to sign all necessary forms in order for [place] to release information regarding their care.   Consent: Patient/Guardian gives verbal consent for treatment and assignment of benefits for services provided during this visit. Patient/Guardian expressed understanding and agreed to proceed.    I provided 20 minutes of non-face-to-face time during this encounter.   Kathlee Nations, MD

## 2022-04-26 ENCOUNTER — Other Ambulatory Visit (HOSPITAL_COMMUNITY): Payer: Self-pay | Admitting: *Deleted

## 2022-04-26 ENCOUNTER — Telehealth (HOSPITAL_COMMUNITY): Payer: Self-pay | Admitting: *Deleted

## 2022-04-26 DIAGNOSIS — Z79899 Other long term (current) drug therapy: Secondary | ICD-10-CM

## 2022-04-26 NOTE — Telephone Encounter (Signed)
Lab orders for CBC w/Diff and CMP faxed to Milagros Reap @ [facility] per Dr. Marguerite Olea orders.

## 2022-04-27 ENCOUNTER — Other Ambulatory Visit: Payer: Medicare HMO

## 2022-04-27 ENCOUNTER — Other Ambulatory Visit
Admission: AD | Admit: 2022-04-27 | Discharge: 2022-04-27 | Disposition: A | Discharge status: Home / Self Care | Payer: Medicare HMO | Source: Ambulatory Visit | Attending: Psychiatry | Admitting: Psychiatry

## 2022-04-27 DIAGNOSIS — R972 Elevated prostate specific antigen [PSA]: Secondary | ICD-10-CM | POA: Diagnosis not present

## 2022-04-27 DIAGNOSIS — Z79899 Other long term (current) drug therapy: Secondary | ICD-10-CM | POA: Diagnosis not present

## 2022-04-27 DIAGNOSIS — C61 Malignant neoplasm of prostate: Secondary | ICD-10-CM

## 2022-04-27 LAB — CBC WITH DIFFERENTIAL/PLATELET
Abs Immature Granulocytes: 0.01 10*3/uL (ref 0.00–0.07)
Basophils Absolute: 0 10*3/uL (ref 0.0–0.1)
Basophils Relative: 0 %
Eosinophils Absolute: 0.1 10*3/uL (ref 0.0–0.5)
Eosinophils Relative: 3 %
HCT: 41 % (ref 39.0–52.0)
Hemoglobin: 13.4 g/dL (ref 13.0–17.0)
Immature Granulocytes: 0 %
Lymphocytes Relative: 38 %
Lymphs Abs: 1.8 10*3/uL (ref 0.7–4.0)
MCH: 31.2 pg (ref 26.0–34.0)
MCHC: 32.7 g/dL (ref 30.0–36.0)
MCV: 95.3 fL (ref 80.0–100.0)
Monocytes Absolute: 0.3 10*3/uL (ref 0.1–1.0)
Monocytes Relative: 6 %
Neutro Abs: 2.5 10*3/uL (ref 1.7–7.7)
Neutrophils Relative %: 53 %
Platelets: 199 10*3/uL (ref 150–400)
RBC: 4.3 MIL/uL (ref 4.22–5.81)
RDW: 12.6 % (ref 11.5–15.5)
WBC: 4.8 10*3/uL (ref 4.0–10.5)
nRBC: 0 % (ref 0.0–0.2)

## 2022-04-27 LAB — COMPREHENSIVE METABOLIC PANEL
ALT: 19 U/L (ref 0–44)
AST: 26 U/L (ref 15–41)
Albumin: 4.3 g/dL (ref 3.5–5.0)
Alkaline Phosphatase: 52 U/L (ref 38–126)
Anion gap: 5 (ref 5–15)
BUN: 17 mg/dL (ref 8–23)
CO2: 27 mmol/L (ref 22–32)
Calcium: 9.2 mg/dL (ref 8.9–10.3)
Chloride: 108 mmol/L (ref 98–111)
Creatinine, Ser: 1.06 mg/dL (ref 0.61–1.24)
GFR, Estimated: >60 mL/min (ref >60)
Glucose, Bld: 81 mg/dL (ref 70–99)
Potassium: 4.3 mmol/L (ref 3.5–5.1)
Sodium: 140 mmol/L (ref 135–145)
Total Bilirubin: 0.8 mg/dL (ref 0.3–1.2)
Total Protein: 7.1 g/dL (ref 6.5–8.1)

## 2022-04-29 LAB — PSA: Prostate Specific Ag, Serum: 4.8 ng/mL — ABNORMAL HIGH (ref 0.0–4.0)

## 2022-05-04 ENCOUNTER — Other Ambulatory Visit: Payer: Medicare HMO | Admitting: Urology

## 2022-05-05 ENCOUNTER — Other Ambulatory Visit: Payer: Medicare HMO | Admitting: Urology

## 2022-05-11 ENCOUNTER — Ambulatory Visit (INDEPENDENT_AMBULATORY_CARE_PROVIDER_SITE_OTHER): Payer: Medicare HMO | Admitting: Urology

## 2022-05-11 ENCOUNTER — Encounter: Payer: Self-pay | Admitting: Urology

## 2022-05-11 VITALS — BP 122/79 | HR 62 | Ht 75.0 in | Wt 154.0 lb

## 2022-05-11 DIAGNOSIS — N401 Enlarged prostate with lower urinary tract symptoms: Secondary | ICD-10-CM

## 2022-05-11 DIAGNOSIS — N402 Nodular prostate without lower urinary tract symptoms: Secondary | ICD-10-CM | POA: Diagnosis not present

## 2022-05-11 DIAGNOSIS — R972 Elevated prostate specific antigen [PSA]: Secondary | ICD-10-CM

## 2022-05-11 DIAGNOSIS — C61 Malignant neoplasm of prostate: Secondary | ICD-10-CM

## 2022-05-11 DIAGNOSIS — N138 Other obstructive and reflux uropathy: Secondary | ICD-10-CM | POA: Diagnosis not present

## 2022-05-11 MED ORDER — LEVOFLOXACIN 500 MG PO TABS
500.0000 mg | ORAL_TABLET | Freq: Once | ORAL | Status: AC
  Administered 2022-05-11: 500 mg via ORAL

## 2022-05-11 MED ORDER — CEFTRIAXONE SODIUM 1 G IJ SOLR
1.0000 g | Freq: Once | INTRAMUSCULAR | Status: AC
  Administered 2022-05-11: 1 g via INTRAMUSCULAR

## 2022-05-11 NOTE — Progress Notes (Signed)
   05/11/22  CC:  Chief Complaint  Patient presents with   Prostate Biopsy    HPI: 61 yo M with h/o low risk prostate cancer who presents today for confirmatory biopsy.    Blood pressure 122/79, pulse 62, height '6\' 3"'$  (1.905 m), weight 154 lb (69.9 kg). NED. A&Ox3.   No respiratory distress   Abd soft, NT, ND Normal sphincter tone  Prostate Biopsy Procedure   Informed consent was obtained after discussing risks/benefits of the procedure.  A time out was performed to ensure correct patient identity.  Pre-Procedure: - Ceftriaxone given prophylactically - Levaquin 500 mg administered PO -Transrectal Ultrasound performed revealing a 49.8 gm prostate -No significant hypoechoic or median lobe noted  Procedure: - Prostate block performed using 10 cc 1% lidocaine and biopsies taken from sextant areas, a total of 12 under ultrasound guidance.  Post-Procedure: - Patient tolerated the procedure well - He was counseled to seek immediate medical attention if experiences any severe pain, significant bleeding, or fevers - Return in one week to discuss biopsy results   [name], MD

## 2022-05-11 NOTE — Patient Instructions (Signed)

## 2022-05-12 LAB — SURGICAL PATHOLOGY

## 2022-05-19 ENCOUNTER — Encounter: Payer: Self-pay | Admitting: Urology

## 2022-05-19 ENCOUNTER — Ambulatory Visit (INDEPENDENT_AMBULATORY_CARE_PROVIDER_SITE_OTHER): Payer: Medicare HMO | Admitting: Urology

## 2022-05-19 VITALS — BP 133/85 | HR 66 | Ht 75.0 in | Wt 154.0 lb

## 2022-05-19 DIAGNOSIS — C61 Malignant neoplasm of prostate: Secondary | ICD-10-CM

## 2022-05-19 NOTE — Patient Instructions (Signed)
Prostate Cancer  The prostate is a small gland that produces fluid that makes up semen (seminal fluid). It is located below the bladder in men, in front of the rectum. Prostate cancer is the abnormal growth of cells in the prostate gland. What are the causes? The exact cause of this condition is not known. What increases the risk? You are more likely to develop this condition if: You are 63 years of age or older. You have a family history of prostate cancer. You have a family history of breast and ovarian cancer. You have genes that are passed from parent to child (inherited), such as BRCA1 and BRCA2. You have Lynch syndrome. African American men and men of African descent are diagnosed with prostate cancer at higher rates than other men. The reasons for this are not well understood and are likely due to a combination of genetic and environmental factors. What are the signs or symptoms? Symptoms of this condition include: Problems with urination. This may include: A weak or interrupted flow of urine. Trouble starting or stopping urination. Trouble emptying the bladder all the way. The need to urinate more often, especially at night. Blood in urine or semen. Persistent pain or discomfort in the lower back, lower abdomen, or hips. Trouble getting an erection. Weakness or numbness in the legs or feet. How is this diagnosed? This condition can be diagnosed with: A digital rectal exam. For this exam, a health care provider inserts a gloved finger into the rectum to feel the prostate gland. A blood test called a prostate-specific antigen (PSA) test. A procedure in which a sample of tissue is taken from the prostate and checked under a microscope (prostate biopsy). An imaging test called transrectal ultrasonography. Once the condition is diagnosed, tests will be done to determine how far the cancer has spread. This is called staging the cancer. Staging may involve imaging tests, such as a bone  scan, CT scan, PET scan, or MRI. Stages of prostate cancer The stages of prostate cancer are as follows: Stage 1 (I). At this stage, the cancer is found in the prostate only. The cancer is not visible on imaging tests, and it is usually found by accident, such as during prostate surgery. Stage 2 (II). At this stage, the cancer is more advanced than it is in stage 1, but the cancer has not spread outside the prostate. Stage 3 (III). At this stage, the cancer has spread beyond the outer layer of the prostate to nearby tissues. The cancer may be found in the seminal vesicles, which are near the bladder and the prostate. Stage 4 (IV). At this stage, the cancer has spread to other parts of the body, such as the lymph nodes, bones, bladder, rectum, liver, or lungs. Prostate cancer grading Prostate cancer is also graded according to how the cancer cells look under a microscope. This is called the Gleason score and the total score can range from 6-10, indicating how likely it is that the cancer will spread (metastasize) to other parts of the body. The higher the score, the greater the likelihood that the cancer will spread. Gleason 6 or lower: This indicates that the cancer cells look similar to normal prostate cells (well differentiated). Gleason 7: This indicates that the cancer cells look somewhat similar to normal prostate cells (moderately differentiated). Gleason 8, 9, or 10: This indicates that the cancer cells look very different than normal prostate cells (poorly differentiated). How is this treated? Treatment for this condition depends on several  factors, including the stage of the cancer, your age, personal preferences, and your overall health. Talk with your health care provider about treatment options that are recommended for you. Common treatments include: Observation for early stage prostate cancer (active surveillance). This involves having exams, blood tests, and in some cases, more biopsies.  For some men, this is the only treatment needed. Surgery. Types of surgeries include: Open surgery (radical prostatectomy). In this surgery, a larger incision is made to remove the prostate. A laparoscopic radical prostatectomy. This is a surgery to remove the prostate and lymph nodes through several small incisions. It is often referred to as a minimally invasive surgery. A robotic radical prostatectomy. This is laparoscopic surgery to remove the prostate and lymph nodes with the help of robotic arms that are controlled by the surgeon. Cryoablation. This is surgery to freeze and destroy cancer cells. Radiation treatment. Types of radiation treatment include: External beam radiation. This type aims beams of radiation from outside the body at the prostate to destroy cancerous cells. Brachytherapy. This type uses radioactive needles, seeds, wires, or tubes that are implanted into the prostate gland. Like external beam radiation, brachytherapy destroys cancerous cells. An advantage is that this type of radiation limits the damage to surrounding tissue and has fewer side effects. Chemotherapy. This treatment kills cancer cells or stops them from multiplying. It kills both cancer cells and normal cells. Targeted therapy. This treatment uses medicines to kill cancer cells without damaging normal cells. Hormone treatment. This treatment involves taking medicines that act on testosterone, one of the male hormones, by: Stopping your body from producing testosterone. Blocking testosterone from reaching cancer cells. Follow these instructions at home: Lifestyle Do not use any products that contain nicotine or tobacco. These products include cigarettes, chewing tobacco, and vaping devices, such as e-cigarettes. If you need help quitting, ask your health care provider. Eat a healthy diet. To do this: Eat foods that are high in fiber. These include beans, whole grains, and fresh fruits and vegetables. Limit  foods that are high in fat and sugar. These include fried or sweet foods. Treatment for prostate cancer may affect sexual function. If you have a partner, continue to have intimate moments. This may include touching, holding, hugging, and caressing your partner. Get plenty of sleep. Consider joining a support group for men who have prostate cancer. Meeting with a support group may help you learn to manage the stress of having cancer. General instructions Take over-the-counter and prescription medicines only as told by your health care provider. If you have to go to the hospital, notify your cancer specialist (oncologist). Keep all follow-up visits. This is important. Where to find more information American Cancer Society: www.cancer.[place] [employer]: www.cancer.net [employer]: www.cancer.gov Contact a health care provider if: You have new or increasing trouble urinating. You have new or increasing blood in your urine. You have new or increasing pain in your hips, back, or chest. Get help right away if: You have weakness or numbness in your legs. You cannot control urination or your bowel movements (incontinence). You have chills or a fever. Summary The prostate is a small gland that is involved in the production of semen. It is located below a man's bladder, in front of the rectum. Prostate cancer is the abnormal growth of cells in the prostate gland. Treatment for this condition depends on the stage of the cancer, your age, personal preferences, and your overall health. Talk with your health care provider about  treatment options that are recommended for you. Consider joining a support group for men who have prostate cancer. Meeting with a support group may help you learn to manage the stress of having cancer. This information is not intended to replace advice given to you by your health care provider. Make sure you discuss any questions you have with  your health care provider. Document Revised: 08/20/2020 Document Reviewed: 08/20/2020 Elsevier Patient Education  2023 Elsevier Inc.  Robot-Assisted Laparoscopic Radical Prostatectomy  Robot-assisted laparoscopic radical prostatectomy is surgery done to remove the entire prostate and nearby tissue. This includes the seminal vesicles, which are near the bladder and the prostate. This procedure is done to treat prostate cancer that has not spread (metastasized) to other parts of the body. The goal of the surgery is to remove all cancer cells to help keep the cancer from metastasizing. During this procedure, the surgeon makes several incisions in the abdomen instead of one large incision. A long, thin, lighted tube with a tiny camera on the end (laparoscope) is put into one of the incisions. This allows the surgeon to see inside the abdomen. Other surgical tools are put in through the other incisions and used to take out the prostate and nearby tissues. The surgeon uses robotic arms to control these tools while sitting at a computer near the operating table. Lymph nodes in the pelvis may also be removed. Lymph nodes are part of the body's disease-fighting system (immune system). When prostate cancer spreads, it tends to go to the lymph nodes in the pelvis first. If the pelvic lymph nodes are removed, they will be checked for cancer cells. Tell a health care provider about: Any allergies you have. All medicines you are taking, including vitamins, herbs, eye drops, creams, and over-the-counter medicines. Any problems you or family members have had with anesthetic medicines. Any bleeding problems you have. Any surgeries you have had. Any medical conditions you have. Any prostate infections you have had. What are the risks? Generally, this is a safe procedure. Still, problems may occur, including: Infection. Bleeding. Allergic reactions to medicines. Damage to nearby structures or organs, such as the  rectum, ureters, urethra, bladder, or small intestine. Blockage (obstruction) of the large or small intestines. Problems that affect urination or sexual function. These may include: Narrowing or scarring of the urethra (stricture), which may block the flow of urine. Inability to control when you urinate (incontinence). Inability to get or keep an erection (erectile dysfunction). Dry ejaculation. This is when no semen comes out during orgasm. The formation of a sac (cyst) in the pelvis that is filled with fluid from the lymph glands (lymphocele). Blood clots in the legs. What happens before the procedure? Staying hydrated Follow instructions from your health care provider about hydration, which may include: Up to 2 hours before the procedure - you may continue to drink clear liquids, such as water, clear fruit juice, black coffee, and plain tea.  Eating and drinking restrictions Follow instructions from your health care provider about eating and drinking, which may include: 8 hours before the procedure - stop eating heavy meals or foods, such as meat, fried foods, or fatty foods. 6 hours before the procedure - stop eating light meals or foods, such as toast or cereal. 6 hours before the procedure - stop drinking milk or drinks that contain milk. 2 hours before the procedure - stop drinking clear liquids. Medicines Ask your health care provider about: Changing or stopping your regular medicines. This is especially important   if you are taking diabetes medicines or blood thinners. Taking medicines such as aspirin and ibuprofen. These medicines can thin your blood. Do not take these medicines unless your health care provider tells you to take them. Taking over-the-counter medicines, vitamins, herbs, and supplements. Follow your health care provider's instructions about cleaning out your bowels. Surgery safety Ask your health care provider: How your surgery site will be marked. What steps will  be taken to help prevent infection. These steps may include: Removing hair at the surgery site. Washing skin with a germ-killing soap. Taking antibiotic medicine. General instructions Do not use any products that contain nicotine or tobacco for at least 4 weeks before the procedure. These products include cigarettes, chewing tobacco, and vaping devices, such as e-cigarettes. If you need help quitting, ask your health care provider. Plan to have a responsible adult take you home from the hospital or clinic. Plan to have a responsible adult care for you for the time you are told after you leave the hospital or clinic. You may have an exam or testing. This may include blood or urine samples, or imaging tests such as a CT scan or an MRI. What happens during the procedure? An IV will be put into a vein in your hand or arm. You may be given: A medicine to help you relax (sedative). A medicine to make you fall asleep (general anesthetic). A thin, flexible tube (Foley catheter) will be put into your penis through your urethra and into your bladder to drain your urine. Small incisions will be made in your abdomen and near your belly button. The laparoscope and other surgical instruments will be put through the incisions. The surgical tools will be used to cut and remove your prostate, seminal vesicles, and maybe your pelvic lymph nodes. Your surgeon will use a computer and robotic arms to control the surgical instruments. Your urethra will be cut and separated from your bladder to take out the prostate. Your urethra will then be reconnected to your bladder neck. This is the group of muscles that help push urine through your urethra. A small tube (drain) may be put in one or more of your incisions to help drain extra fluid from your surgical site after surgery. The laparoscope and other surgical instruments will be removed. Your incisions will be closed with stitches (sutures), skin glue, or adhesive  strips. Medicine may be applied and bandages (dressings) will be placed over your incisions. The procedure may vary among health care providers and hospitals. What happens after the procedure? Your blood pressure, heart rate, breathing rate, and blood oxygen level will be monitored until you leave the hospital or clinic. You may get fluids and medicines through your IV. You may be given antibiotics and medicines to help relieve pain or nausea. You will be encouraged to walk as soon as possible. You will also use a device or do breathing exercises to keep your lungs clear. The catheter will stay in to drain urine from your bladder. You will be taught how to care for it at home. The drain may stay in to drain fluid from the surgical site. If so, you will be taught how to care for it at home. You may need to wear compression stockings until you are able to get up and walk around. These stockings help prevent blood clots and reduce swelling in your legs. If you were given a sedative during the procedure, it can affect you for several hours. Do not drive or operate   machinery until your health care provider says that it is safe. Summary Robot-assisted laparoscopic radical prostatectomy is a surgical procedure to remove the entire prostate and the seminal vesicles. Follow instructions from your health care provider about eating and drinking before your surgery. After your procedure, you may be given fluids and medicines through an IV. You may get antibiotics and medicines to help relieve pain or nausea. After your surgery, you will continue to have a small, thin tube (Foley catheter) draining your urine. You will be taught how to care for it at home. This information is not intended to replace advice given to you by your health care provider. Make sure you discuss any questions you have with your health care provider. Document Revised: 08/20/2020 Document Reviewed: 08/20/2020 Elsevier Patient Education   2023 Elsevier Inc.  

## 2022-05-19 NOTE — Progress Notes (Signed)
05/19/2022 11:32 AM   Ian Cook 12-24-1960 [id number]  Referring provider: [name], MD [address] [place],  [place] [address]  Chief Complaint  Patient presents with   Prostate Biopsy    results    HPI: 61 year old male with a personal history of low risk prostate cancer now with progression to favorable intermediate risk prostate cancer, high-volume presents today to discuss results.  He had a rising PSA and abnormal rectal exam front nodule and right apex that prompted a prostate biopsy.    He underwent a prostate biopsy on 04/15/2021 that revealed Gleason 3+3=6 affecting 8 cores.  Is a continue to rise, up to 5.66 months ago but repeat was 4.83 weeks ago.   MRI on 10/22/2021 visualized two small PI-RADS category 3 lesions in the peripheral zone and mild benign prostatic hypertrophy.  He had no evidence of extracapsular disease, no lymphadenopathy or bone mets at this point in time.  Trus vol 50 g  He denies any baseline urinary symptoms, urgency or frequency.    IPSS     Row Name 05/19/22 1100         International Prostate Symptom Score   How often have you had the sensation of not emptying your bladder? Not at All     How often have you had to urinate less than every two hours? Not at All     How often have you found you stopped and started again several times when you urinated? Not at All     How often have you found it difficult to postpone urination? Not at All     How often have you had a weak urinary stream? Not at All     How often have you had to strain to start urination? Not at All     How many times did you typically get up at night to urinate? None     Total IPSS Score 0       Quality of Life due to urinary symptoms   If you were to spend the rest of your life with your urinary condition just the way it is now how would you feel about that? Delighted              Score:  1-7 Mild 8-19 Moderate 20-35 Severe   SHIM     Row  Name 05/19/22 1135         SHIM: Over the last 6 months:   How do you rate your confidence that you could get and keep an erection? Very High     When you had erections with sexual stimulation, how often were your erections hard enough for penetration (entering your partner)? Almost Never or Never     During sexual intercourse, how often were you able to maintain your erection after you had penetrated (entered) your partner? Almost Never or Never     During sexual intercourse, how difficult was it to maintain your erection to completion of intercourse? Not Difficult     When you attempted sexual intercourse, how often was it satisfactory for you? 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 If you have any questions, ask your nurse or doctor.          amLODipine-benazepril 10-20 MG capsule Commonly known as: LOTREL Take 1 capsule by mouth daily.   atorvastatin 10 MG tablet Commonly known as: LIPITOR Take 10 mg by mouth daily.   benztropine 0.5 MG tablet Commonly known as: COGENTIN Take 1 tablet (0.5 mg total) by mouth daily.   dorzolamide-timolol 2-0.5 % ophthalmic solution Commonly known as: COSOPT   finasteride 5 MG tablet Commonly known as: PROSCAR Take 1 tablet (5 mg total) by mouth daily.   methimazole 5 MG tablet Commonly known as: TAPAZOLE Take 5 mg by mouth.   OLANZapine 5 MG tablet Commonly known as: ZYPREXA Take 1 tablet (5 mg total) by mouth  at bedtime.   tamsulosin 0.4 MG Caps capsule Commonly known as: FLOMAX   traZODone 100 MG tablet Commonly known as: DESYREL Take 1 tablet (100 mg total) by mouth at bedtime.        Allergies: No Known Allergies  Family History: Family History  Problem Relation Age of Onset   Aneurysm Mother    Depression Mother    CAD Father    Kidney cancer Neg Hx    Prostate cancer Neg Hx     Social History:  reports that he has been smoking cigarettes. He has a 29.25 pack-year smoking history. He has been exposed to tobacco smoke. He has never used smokeless tobacco. He reports that he does not drink alcohol and does not use drugs.   Physical Exam: Ht '6\' 3"'$  (1.905 m)   Wt 154 lb (69.9 kg)   BMI 19.25 kg/m   Constitutional:  Alert and oriented, No acute distress. HEENT: [place] AT, moist mucus membranes.  Trachea midline, no masses. Cardiovascular: No clubbing, cyanosis, or edema. Neurologic: Grossly intact, no focal deficits, moving all 4 extremities. Psychiatric: Normal mood and affect.  Laboratory Data: Lab Results  Component Value Date   WBC 4.8 04/27/2022   HGB 13.4 04/27/2022   HCT 41.0 04/27/2022   MCV 95.3 04/27/2022   PLT 199 04/27/2022    Lab Results  Component Value Date   CREATININE 1.06 04/27/2022    Lab Results  Component Value Date   HGBA1C 6.7 (H) 03/28/2018    Assessment & Plan:    1. Prostate cancer [facility]) Now with upstaging to favorable intermediate risk prostate cancer  Given the overall volume of disease and upstaging of his risk category, strongly recommend intervention with curative intent at this point in time.  We do lengthy discussion today about the option of radical prostatectomy with pelvic lymph node dissection versus radiation with 6 months of hormones.  Risk and benefits of each were discussed in detail as they relate to sexual function and urinary control amongst others.  He does have a healthcare proxy is not present today.  I will  reach out to both his caretaker and healthcare proxy to help in the decision-making process.  In the interim, he is interested in having further discussion about radiation.  Will plan for radiation oncology referral.  May be helpful to have his caretaker present at  this appointment which was advised.  We did go ahead and discuss Eligard/leuprolide side effects including hot flashes, loss of muscle mass, sexual side effects including changes in libido amongst others.  Ultimately, we will plan on follow-up depending on what he elects to do and after discussion with his healthcare proxy about how to proceed. - Ambulatory referral to [place], [place]  [facility] [address], [place], [place] [address] [phone]  I spent 35 total minutes on the day of the encounter including pre-visit review of the medical record, face-to-face time with the patient, and post visit ordering of labs/imaging/tests.

## 2022-06-01 ENCOUNTER — Ambulatory Visit: Payer: Medicare HMO | Admitting: Radiation Oncology

## 2022-06-03 ENCOUNTER — Encounter: Payer: Self-pay | Admitting: Radiation Oncology

## 2022-06-03 ENCOUNTER — Ambulatory Visit
Admission: RE | Admit: 2022-06-03 | Discharge: 2022-06-03 | Disposition: A | Discharge status: Home / Self Care | Payer: Medicare HMO | Source: Ambulatory Visit | Attending: Radiation Oncology | Admitting: Radiation Oncology

## 2022-06-03 VITALS — BP 128/84 | HR 63 | Resp 14 | Ht 76.0 in | Wt 152.8 lb

## 2022-06-03 DIAGNOSIS — F259 Schizoaffective disorder, unspecified: Secondary | ICD-10-CM | POA: Insufficient documentation

## 2022-06-03 DIAGNOSIS — Z79899 Other long term (current) drug therapy: Secondary | ICD-10-CM | POA: Diagnosis not present

## 2022-06-03 DIAGNOSIS — C61 Malignant neoplasm of prostate: Secondary | ICD-10-CM | POA: Diagnosis not present

## 2022-06-03 DIAGNOSIS — H409 Unspecified glaucoma: Secondary | ICD-10-CM | POA: Diagnosis not present

## 2022-06-03 DIAGNOSIS — I10 Essential (primary) hypertension: Secondary | ICD-10-CM | POA: Insufficient documentation

## 2022-06-03 DIAGNOSIS — F17210 Nicotine dependence, cigarettes, uncomplicated: Secondary | ICD-10-CM | POA: Diagnosis not present

## 2022-06-03 DIAGNOSIS — Z191 Hormone sensitive malignancy status: Secondary | ICD-10-CM | POA: Diagnosis not present

## 2022-06-03 NOTE — Consult Note (Signed)
NEW PATIENT EVALUATION  Name: Ian Cook  MRN: [id number]  Date:   06/03/2022     DOB: 12-Jan-1961   This 62 y.o. male patient presents to the clinic for initial evaluation of stage IIb (cT2 cN0 M0) Gleason 7 (3+4) adenocarcinoma the prostate presenting with a PSA of 5.6.  REFERRING PHYSICIAN: [name], MD  CHIEF COMPLAINT:  Chief Complaint  Patient presents with   Prostate Cancer    Consult    DIAGNOSIS: The encounter diagnosis was Malignant neoplasm of prostate ([place]).   PREVIOUS INVESTIGATIONS:  MRI scan reviewed Pathology reports reviewed Clinical notes reviewed  HPI: Patient is a 61 year old male with a history of schizoaffective disorder who has a power of healthcare attorney.  He presents with a rising PSA up to 5.6.  He had abnormal rectal exam showing nodularity bilaterally.  MRI scan of his prostate 2 6 showed 2 small PI-RADS category 3 lesions in the peripheral zone.  He underwent prostate biopsy showing 11 of 12 cores positive for mostly Gleason 7 (3+4).  He has been counseled by urology on robotic assisted prostatectomy.  He is fairly asymptomatic from urologic standpoint.  I been asked to evaluate him for possible radiation therapy.  PLANNED TREATMENT REGIMEN: Robotic prostatectomy versus I-125 interstitial implant  PAST MEDICAL HISTORY:  has a past medical history of Anxiety, Glaucoma, HTN (hypertension) (11/01/2014), Hypertension, Schizo-affective schizophrenia ([place]), Schizoaffective disorder, manic type ([place]) (10/30/2014), and Tobacco use disorder (11/01/2014).    PAST SURGICAL HISTORY:  Past Surgical History:  Procedure Laterality Date   TONSILLECTOMY      FAMILY HISTORY: family history includes Aneurysm in his mother; CAD in his father; Depression in his mother.  SOCIAL HISTORY:  reports that he has been smoking cigarettes. He has a 29.25 pack-year smoking history. He has been exposed to tobacco smoke. He has never used smokeless tobacco. He reports  that he does not drink alcohol and does not use drugs.  ALLERGIES: Patient has no known allergies.  MEDICATIONS:  Current Outpatient Medications  Medication Sig Dispense Refill   amLODipine-benazepril (LOTREL) 10-20 MG capsule Take 1 capsule by mouth daily.     atorvastatin (LIPITOR) 10 MG tablet Take 10 mg by mouth daily.     benztropine (COGENTIN) 0.5 MG tablet Take 1 tablet (0.5 mg total) by mouth daily. 90 tablet 1   dorzolamide-timolol (COSOPT) 22.3-6.8 MG/ML ophthalmic solution      finasteride (PROSCAR) 5 MG tablet Take 1 tablet (5 mg total) by mouth daily. 90 tablet 3   methimazole (TAPAZOLE) 5 MG tablet Take 5 mg by mouth.     OLANZapine (ZYPREXA) 5 MG tablet Take 1 tablet (5 mg total) by mouth at bedtime. 90 tablet 1   tamsulosin (FLOMAX) 0.4 MG CAPS capsule      traZODone (DESYREL) 100 MG tablet Take 1 tablet (100 mg total) by mouth at bedtime. 90 tablet 1   No current facility-administered medications for this encounter.    ECOG PERFORMANCE STATUS:  0 - Asymptomatic  REVIEW OF SYSTEMS: Patient has schizoaffective disorder.  Patient has a history of anxiety hypertension. Patient denies any weight loss, fatigue, weakness, fever, chills or night sweats. Patient denies any loss of vision, blurred vision. Patient denies any ringing  of the ears or hearing loss. No irregular heartbeat. Patient denies heart murmur or history of fainting. Patient denies any chest pain or pain radiating to her upper extremities. Patient denies any shortness of breath, difficulty breathing at night, cough or hemoptysis. Patient denies  any swelling in the lower legs. Patient denies any nausea vomiting, vomiting of blood, or coffee ground material in the vomitus. Patient denies any stomach pain. Patient states has had normal bowel movements no significant constipation or diarrhea. Patient denies any dysuria, hematuria or significant nocturia. Patient denies any problems walking, swelling in the joints or loss  of balance. Patient denies any skin changes, loss of hair or loss of weight. Patient denies any excessive worrying or anxiety or significant depression. Patient denies any problems with insomnia. Patient denies excessive thirst, polyuria, polydipsia. Patient denies any swollen glands, patient denies easy bruising or easy bleeding. Patient denies any recent infections, allergies or URI. Patient "s visual fields have not changed significantly in recent time.   PHYSICAL EXAM: BP 128/84   Pulse 63   Resp 14   Ht '6\' 4"'$  (1.93 m)   Wt 152 lb 12.8 oz (69.3 kg)   BMI 18.60 kg/m  Well-developed well-nourished patient in NAD. HEENT reveals PERLA, EOMI, discs not visualized.  Oral cavity is clear. No oral mucosal lesions are identified. Neck is clear without evidence of cervical or supraclavicular adenopathy. Lungs are clear to A&P. Cardiac examination is essentially unremarkable with regular rate and rhythm without murmur rub or thrill. Abdomen is benign with no organomegaly or masses noted. Motor sensory and DTR levels are equal and symmetric in the upper and lower extremities. Cranial nerves II through XII are grossly intact. Proprioception is intact. No peripheral adenopathy or edema is identified. No motor or sensory levels are noted. Crude visual fields are within normal range.  LABORATORY DATA: Pathology reports reviewed    RADIOLOGY RESULTS: MRI scan reviewed compatible with above-stated findings   IMPRESSION: Stage IIb Gleason 7 (3+4) adenocarcinoma the prostate in 61 year old male presents with a PSA in the 5 range  PLAN: At this time of gone over recommendations for both robotic prostatectomy as well as I-125 interstitial implant.  I have told the patient and his health caregiver that my #1 choice would be prostatectomy based on his health and young age and that generally after radiation there is no salvage radiation should he have biochemical failure.  Converses true after surgery we can do  salvage radiation therapy should he have biochemical failure.  Patient will decide with his healthcare worker and power of healthcare attorney which course he would like to follow.  Risks and benefits of radiation including radiation safety precautions increased lower urine tract symptoms possible diarrhea fatigue all were discussed in detail.  I would like to take this opportunity to thank you for allowing me to participate in the care of your patient.Noreene Filbert, MD

## 2022-06-09 ENCOUNTER — Telehealth: Payer: Self-pay | Admitting: Urology

## 2022-06-09 NOTE — Telephone Encounter (Signed)
Reviewed patient chart, it appears that the patient is leaning towards robotic prostatectomy.  Unfortunately, in his initial consultation, level of patient comprehension was somewhat unclear.  He does have a legal guardian.  Please schedule an appointment at the end of the morning or end of the afternoon with his legal guardian present, Ms. Darnell Level to further this discussion.  [phone]   [name], MD

## 2022-06-10 NOTE — Telephone Encounter (Signed)
Ms Ian Cook advised and scheduled for 06/16/22 at 11 30

## 2022-06-16 ENCOUNTER — Ambulatory Visit (INDEPENDENT_AMBULATORY_CARE_PROVIDER_SITE_OTHER): Payer: Medicare HMO | Admitting: Urology

## 2022-06-16 VITALS — BP 139/83 | HR 62 | Ht 76.0 in

## 2022-06-16 DIAGNOSIS — C61 Malignant neoplasm of prostate: Secondary | ICD-10-CM

## 2022-06-16 DIAGNOSIS — F17200 Nicotine dependence, unspecified, uncomplicated: Secondary | ICD-10-CM

## 2022-06-16 NOTE — Progress Notes (Signed)
I, Ian Cook,acting as a scribe for Ian Espy, MD.,have documented all relevant documentation on the behalf of Ian Espy, MD,as directed by  Ian Espy, MD while in the presence of Ian Espy, MD.   06/16/22 1:25 PM   Ian Cook [birth year] [id number]  Referring provider: [name], MD [address] [place],  [place] [address]  Chief Complaint  Patient presents with   discuss surgery    HPI: 62 year-old male with a personal history of prostate cancer, here today to discuss surgery.   He underwent a prostate biopsy on 04/15/2021 that revealed Gleason 3+3=6 affecting 8 cores.  MRI on 10/22/2021 visualized two small PI-RADS category 3 lesions in the peripheral zone and mild benign prostatic hypertrophy.  He had no evidence of extracapsular disease, no lymphadenopathy or bone mets at this point in time. Trus volume 50 grams.  His most recent PSA was 4.8 on 04/27/22.   He is accompanied by the group home leader Ian Cook. He is a current smoker, a pack a day.   PMH: Past Medical History:  Diagnosis Date   Anxiety    Glaucoma    HTN (hypertension) 11/01/2014   Hypertension    Schizo-affective schizophrenia ([place])    Schizoaffective disorder, manic type ([place]) 10/30/2014   Tobacco use disorder 11/01/2014    Surgical History: Past Surgical History:  Procedure Laterality Date   TONSILLECTOMY      Home Medications:  Allergies as of 06/16/2022   No Known Allergies      Medication List        Accurate as of June 16, 2022  1:25 PM. If you have any questions, ask your nurse or doctor.          amLODipine-benazepril 10-20 MG capsule Commonly known as: LOTREL Take 1 capsule by mouth daily.   atorvastatin 10 MG tablet Commonly known as: LIPITOR Take 10 mg by mouth daily.   benztropine 0.5 MG tablet Commonly known as: COGENTIN Take 1 tablet (0.5 mg total) by mouth daily.   dorzolamide-timolol 2-0.5 % ophthalmic solution Commonly known  as: COSOPT   finasteride 5 MG tablet Commonly known as: PROSCAR Take 1 tablet (5 mg total) by mouth daily.   methimazole 5 MG tablet Commonly known as: TAPAZOLE Take 5 mg by mouth.   OLANZapine 5 MG tablet Commonly known as: ZYPREXA Take 1 tablet (5 mg total) by mouth at bedtime.   tamsulosin 0.4 MG Caps capsule Commonly known as: FLOMAX   traZODone 100 MG tablet Commonly known as: DESYREL Take 1 tablet (100 mg total) by mouth at bedtime.        Family History: Family History  Problem Relation Age of Onset   Aneurysm Mother    Depression Mother    CAD Father    Kidney cancer Neg Hx    Prostate cancer Neg Hx     Social History:  reports that he has been smoking cigarettes. He has a 29.25 pack-year smoking history. He has been exposed to tobacco smoke. He has never used smokeless tobacco. He reports that he does not drink alcohol and does not use drugs.  Physical Exam: BP 139/83   Pulse 62   Ht '6\' 4"'$  (1.93 m)   BMI 18.60 kg/m   Constitutional:  Alert and oriented, No acute distress. HEENT: [place] AT, moist mucus membranes.  Trachea midline, no masses. Neurologic: Grossly intact, no focal deficits, moving all 4 extremities. Psychiatric: Normal mood and affect.  Assessment & Plan:  Prostate cancer  - He opted for surgery. He was accompanied by Ian Cook the group home leader today and she also agrees that surgery is best and that he would do fine with it.   The patient was counseled about the natural history of prostate cancer and the standard treatment options that are available for prostate cancer. It was explained to him how his age and life expectancy, clinical stage, Gleason score, and PSA affect his prognosis, the decision to proceed with additional staging studies, as well as how that information influences recommended treatment strategies. We discussed the roles for active surveillance, radiation therapy, surgical therapy, androgen deprivation, as well as ablative  therapy options for the treatment of prostate cancer as appropriate to his individual cancer situation. We discussed the risks and benefits of these options with regard to their impact on cancer control and also in terms of potential adverse events, complications, and impact on quality of life particularly related to urinary, bowel, and sexual function. The patient was encouraged to ask questions throughout the discussion today and all questions were answered to his stated satisfaction. In addition, the patient was provided with and/or directed to appropriate resources and literature for further education about prostate cancer treatment options.  We discussed surgical therapy for prostate cancer including the different available surgical approaches.  Specifically, we discussed robotic prostatectomy with pelvic lymph node dissection based on his restratification.  We discussed, in detail, the risks and expectations of surgery with regard to cancer control, urinary control, and erectile dysfunction as well as expected post operative recovery processed. Additional risks of surgery including but not limited to bleeding, infection, hernia formation, nerve damage, fistula formation, bowel/rectal injury, potentially necessitating colostomy, damage to the urinary tract resulting in urinary leakage, urethral stricture, and cardiopulmonary risk such as myocardial infarction, stroke, death, thromboembolism etc. were explained.   -We'll refer him to preoperative physical therapy. We'll also need to get his actual healthcare legal guardian to sign off and be in agreement with this plan.   2. Smoker - Discussed the risks of continued smoking as it relates to surgery. Strongly recommend cutting back or quitting prior to surgery. He's not interested or willing to do this.  No follow-ups on file.   [name] [address] [place], [place] [address] [phone]

## 2022-06-17 ENCOUNTER — Other Ambulatory Visit: Payer: Self-pay | Admitting: Urology

## 2022-06-17 DIAGNOSIS — C61 Malignant neoplasm of prostate: Secondary | ICD-10-CM

## 2022-06-17 NOTE — Progress Notes (Signed)
Surgical Physician Order Form [facility]  * Scheduling expectation : Next Available  *Length of Case:   *Clearance needed: no  *Anticoagulation Instructions: Hold all anticoagulants  *Aspirin Instructions: Hold Aspirin  *Post-op visit Date/Instructions:   1 week foley removal/ 6 weeks MD with psa prior  *Diagnosis: Prostate Cancer  *Procedure: bilateral pelvic lymph node dissection, Robotic laparoscopic Prostatectomy (11003)   Additional orders: N/A  -Admit type: Observation  -Anesthesia: General  -VTE Prophylaxis Standing Order SCD's       Other:   -Standing Lab Orders Per Anesthesia    Lab other:  UA/ UCx, CBC, BMP, INR, T&S  -Standing Test orders EKG/Chest x-ray per Anesthesia       Test other:   - Medications:  Ancef 2gm IV  -Other orders:  Please help identity health care proxy to provide consent for surgery

## 2022-06-24 ENCOUNTER — Telehealth: Payer: Self-pay | Admitting: Urology

## 2022-06-24 NOTE — Telephone Encounter (Signed)
Lengthy discussion today with patient's legal guardian, Earl Lagos at [facility] phone number [phone].  We discussed clinical context including patient's diagnosis, stage, standard of care treatment options which do include surgery versus radiation with hormones.  We discussed the risks of surgery including risk of bleeding infection damage surrounding structures as well as those particular to prostatectomy including fertility bladder and sexual function specifically.  She will file the appropriate paperwork through the department and signed the final consent forms.  Contact for our surgical scheduler, Melissa given.  [name], MD

## 2022-06-30 IMAGING — MR MR PROSTATE WO/W CM
56 series · 56 of 56 positions shown · IV contrast (8ml Gadavist)
Comparison: None Available.

CLINICAL DATA: Gleason 3+3=6 prostate adenocarcinoma the right
base, right mid gland, left lateral mid gland, left lateral apex,
right lateral base, right lateral mid gland, and right lateral apex
on biopsy from 03/15/2021. Elevated PSA

EXAM:
MR PROSTATE WITHOUT AND WITH CONTRAST
TECHNIQUE: Multiplanar multisequence MRI images were obtained of the pelvis
centered about the prostate. Pre and post contrast images were
obtained.
CONTRAST:  8mL GADAVIST GADOBUTROL 1 MMOL/ML IV SOLN

[Series 3: ax in&out whole · axial · 6.0mm · 0.74mm/px · 1 of 35 slices shown (1 of 2)]
[im 1/35]
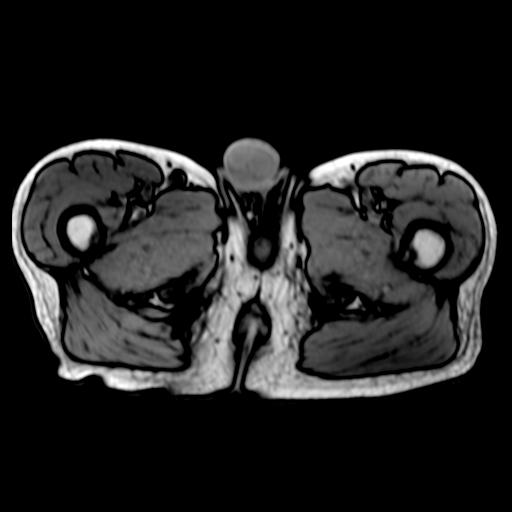

[Series 3: ax in&out whole · axial · 6.0mm · 0.74mm/px · 1 of 35 slices shown (2 of 2)]
[im 1/35]
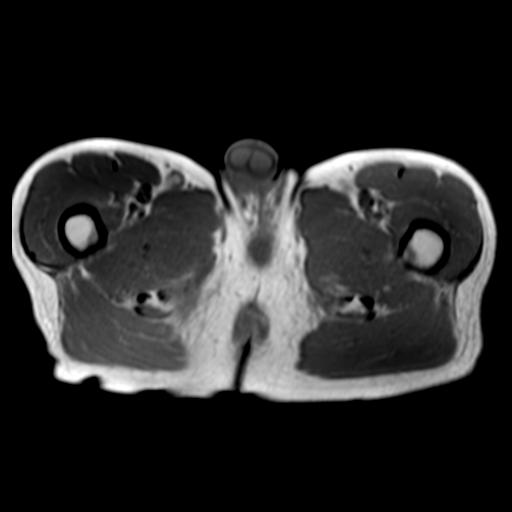

[Series 4: T2 · coronal · 3.0mm · 0.70mm/px · 1 of 35 slices shown (1 of 3)]
[im 1/35]
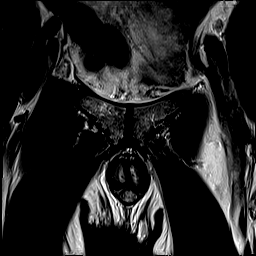

[Series 5: T2 · axial · 3.0mm · 0.56mm/px · 1 of 25 slices shown (2 of 3)]
[im 1/25]
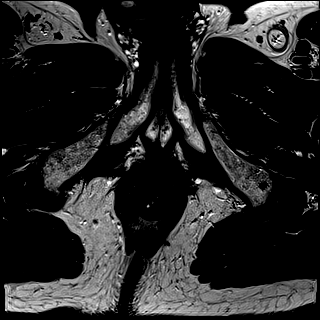

[Series 6: DWI · axial · 3.0mm · 0.86mm/px · 1 of 75 slices shown (1 of 3)]
[im 1/75]
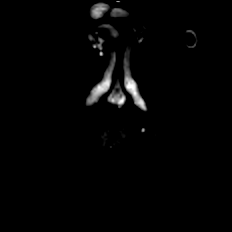

[Series 7: DWI · axial · 3.0mm · 0.86mm/px · 1 of 25 slices shown (2 of 3)]
[im 1/25]
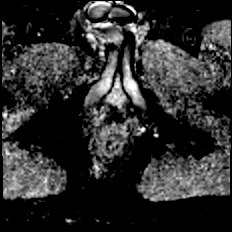

[Series 8: DWI · axial · 3.0mm · 0.86mm/px · 1 of 25 slices shown (3 of 3)]
[im 1/25]
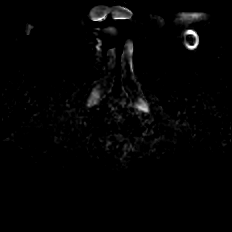

[Series 9: T2 · axial · 1.0mm · 1.04mm/px · 1 of 80 slices shown (3 of 3)]
[im 1/80]
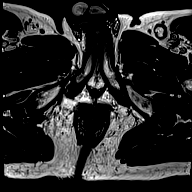

[Series 10: T1 · axial · 3.0mm · 1.15mm/px · 1 of 28 slices shown (1 of 48)]
[im 1/28]
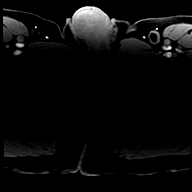

[Series 11: T1 · axial · 3.0mm · 1.15mm/px · 1 of 28 slices shown (2 of 48)]
[im 1/28]
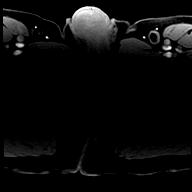

[Series 12: T1 · axial · 3.0mm · 1.15mm/px · 1 of 28 slices shown (3 of 48)]
[im 1/28]
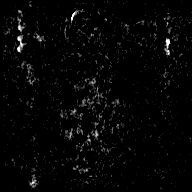

[Series 13: T1 · axial · 3.0mm · 1.15mm/px · 1 of 28 slices shown (4 of 48)]
[im 1/28]
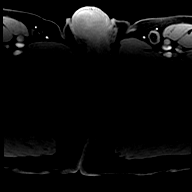

[Series 14: T1 · axial · 3.0mm · 1.15mm/px · 1 of 28 slices shown (5 of 48)]
[im 1/28]
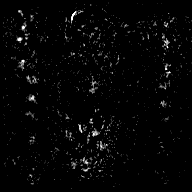

[Series 15: T1 · axial · 3.0mm · 1.15mm/px · 1 of 28 slices shown (6 of 48)]
[im 1/28]
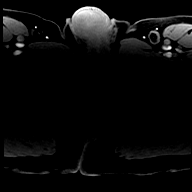

[Series 16: T1 · axial · 3.0mm · 1.15mm/px · 1 of 28 slices shown (7 of 48)]
[im 1/28]
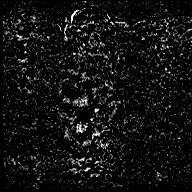

[Series 17: T1 · axial · 3.0mm · 1.15mm/px · 1 of 28 slices shown (8 of 48)]
[im 1/28]
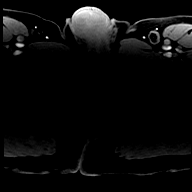

[Series 18: T1 · axial · 3.0mm · 1.15mm/px · 1 of 28 slices shown (9 of 48)]
[im 1/28]
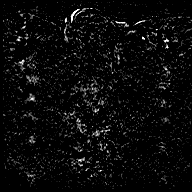

[Series 19: T1 · axial · 3.0mm · 1.15mm/px · 1 of 28 slices shown (10 of 48)]
[im 1/28]
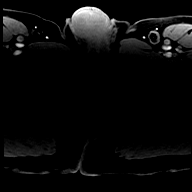

[Series 20: T1 · axial · 3.0mm · 1.15mm/px · 1 of 28 slices shown (11 of 48)]
[im 1/28]
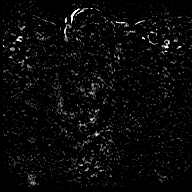

[Series 21: T1 · axial · 3.0mm · 1.15mm/px · 1 of 28 slices shown (12 of 48)]
[im 1/28]
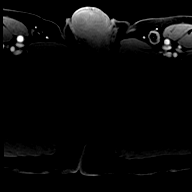

[Series 22: T1 · axial · 3.0mm · 1.15mm/px · 1 of 28 slices shown (13 of 48)]
[im 1/28]
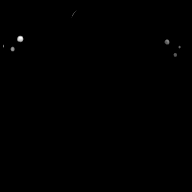

[Series 23: T1 · axial · 3.0mm · 1.15mm/px · 1 of 28 slices shown (14 of 48)]
[im 1/28]
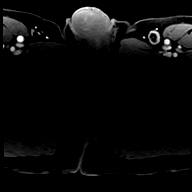

[Series 24: T1 · axial · 3.0mm · 1.15mm/px · 1 of 28 slices shown (15 of 48)]
[im 1/28]
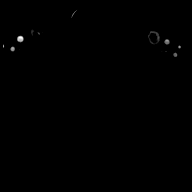

[Series 25: T1 · axial · 3.0mm · 1.15mm/px · 1 of 28 slices shown (16 of 48)]
[im 1/28]
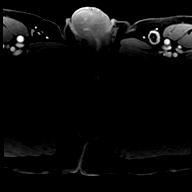

[Series 26: T1 · axial · 3.0mm · 1.15mm/px · 1 of 28 slices shown (17 of 48)]
[im 1/28]
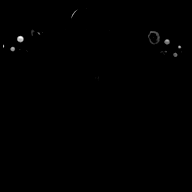

[Series 27: T1 · axial · 3.0mm · 1.15mm/px · 1 of 28 slices shown (18 of 48)]
[im 1/28]
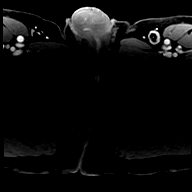

[Series 28: T1 · axial · 3.0mm · 1.15mm/px · 1 of 28 slices shown (19 of 48)]
[im 1/28]
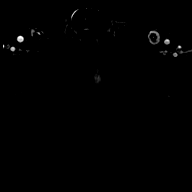

[Series 29: T1 · axial · 3.0mm · 1.15mm/px · 1 of 28 slices shown (20 of 48)]
[im 1/28]
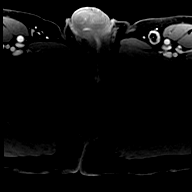

[Series 30: T1 · axial · 3.0mm · 1.15mm/px · 1 of 28 slices shown (21 of 48)]
[im 1/28]
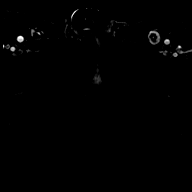

[Series 31: T1 · axial · 3.0mm · 1.15mm/px · 1 of 28 slices shown (22 of 48)]
[im 1/28]
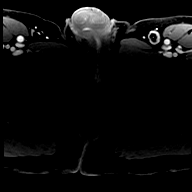

[Series 32: T1 · axial · 3.0mm · 1.15mm/px · 1 of 28 slices shown (23 of 48)]
[im 1/28]
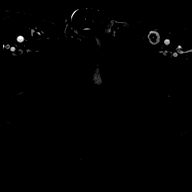

[Series 33: T1 · axial · 3.0mm · 1.15mm/px · 1 of 28 slices shown (24 of 48)]
[im 1/28]
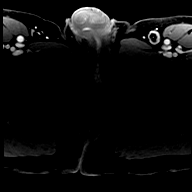

[Series 34: T1 · axial · 3.0mm · 1.15mm/px · 1 of 28 slices shown (25 of 48)]
[im 1/28]
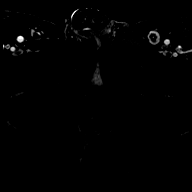

[Series 35: T1 · axial · 3.0mm · 1.15mm/px · 1 of 28 slices shown (26 of 48)]
[im 1/28]
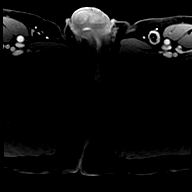

[Series 36: T1 · axial · 3.0mm · 1.15mm/px · 1 of 28 slices shown (27 of 48)]
[im 1/28]
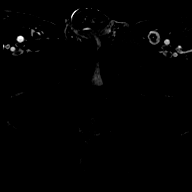

[Series 37: T1 · axial · 3.0mm · 1.15mm/px · 1 of 28 slices shown (28 of 48)]
[im 1/28]
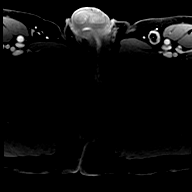

[Series 38: T1 · axial · 3.0mm · 1.15mm/px · 1 of 28 slices shown (29 of 48)]
[im 1/28]
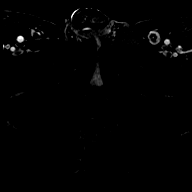

[Series 39: T1 · axial · 3.0mm · 1.15mm/px · 1 of 28 slices shown (30 of 48)]
[im 1/28]
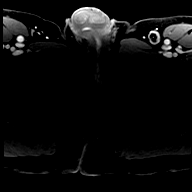

[Series 40: T1 · axial · 3.0mm · 1.15mm/px · 1 of 28 slices shown (31 of 48)]
[im 1/28]
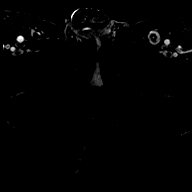

[Series 41: T1 · axial · 3.0mm · 1.15mm/px · 1 of 28 slices shown (32 of 48)]
[im 1/28]
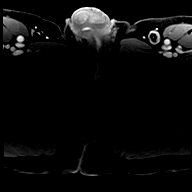

[Series 42: T1 · axial · 3.0mm · 1.15mm/px · 1 of 28 slices shown (33 of 48)]
[im 1/28]
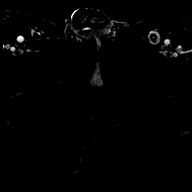

[Series 43: T1 · axial · 3.0mm · 1.15mm/px · 1 of 28 slices shown (34 of 48)]
[im 1/28]
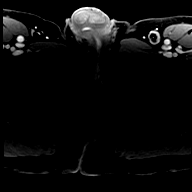

[Series 44: T1 · axial · 3.0mm · 1.15mm/px · 1 of 28 slices shown (35 of 48)]
[im 1/28]
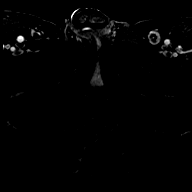

[Series 45: T1 · axial · 3.0mm · 1.15mm/px · 1 of 28 slices shown (36 of 48)]
[im 1/28]
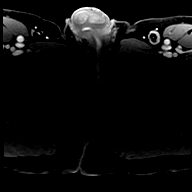

[Series 46: T1 · axial · 3.0mm · 1.15mm/px · 1 of 28 slices shown (37 of 48)]
[im 1/28]
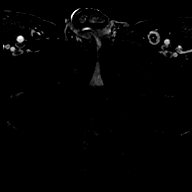

[Series 47: T1 · axial · 3.0mm · 1.15mm/px · 1 of 28 slices shown (38 of 48)]
[im 1/28]
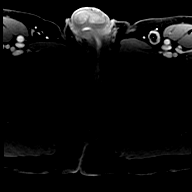

[Series 48: T1 · axial · 3.0mm · 1.15mm/px · 1 of 28 slices shown (39 of 48)]
[im 1/28]
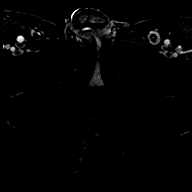

[Series 49: T1 · axial · 3.0mm · 1.15mm/px · 1 of 28 slices shown (40 of 48)]
[im 1/28]
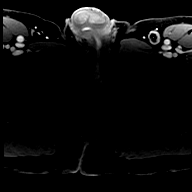

[Series 50: T1 · axial · 3.0mm · 1.15mm/px · 1 of 28 slices shown (41 of 48)]
[im 1/28]
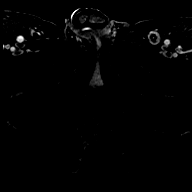

[Series 51: T1 · axial · 3.0mm · 1.15mm/px · 1 of 28 slices shown (42 of 48)]
[im 1/28]
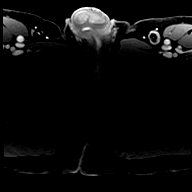

[Series 52: T1 · axial · 3.0mm · 1.15mm/px · 1 of 28 slices shown (43 of 48)]
[im 1/28]
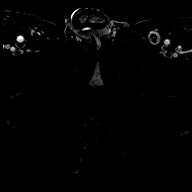

[Series 53: T1 · axial · 3.0mm · 1.15mm/px · 1 of 28 slices shown (44 of 48)]
[im 1/28]
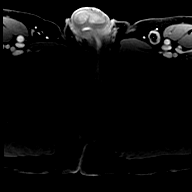

[Series 54: T1 · axial · 3.0mm · 1.15mm/px · 1 of 28 slices shown (45 of 48)]
[im 1/28]
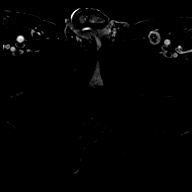

[Series 55: T1 · axial · 3.0mm · 1.15mm/px · 1 of 28 slices shown (46 of 48)]
[im 1/28]
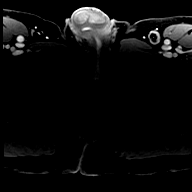

[Series 56: T1 · axial · 3.0mm · 1.15mm/px · 1 of 28 slices shown (47 of 48)]
[im 1/28]
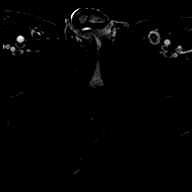

[Series 57: T1 · axial · 3.0mm · 1.15mm/px · 1 of 28 slices shown (48 of 48)]
[im 1/28]
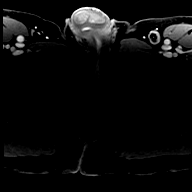

[56 of 56 positions shown; findings below may reference images not displayed]

FINDINGS: Prostate:

Region of interest # 1: PI-RADS category 3 lesion of the left
anterior and left posterolateral peripheral zone at the base, with
mild focally reduced T2 signal (image 11, series 5), minimally
reduced ADC map activity (image 12, series 8), but no restricted
diffusion or early enhancement. This measures 0.20 cc (1.1 by 0.5 by
0.5 cm).

Region of interest # 2: Small PI-RADS category 3 lesion the right
posteromedial peripheral zone in the mid gland and apex, with
focally reduced T2 signal (image 46, series 9) and equivocal E
reduced ADC map activity. This measures 0.12 cc (0.7 by 0.3 by
cm).

Mild encapsulated nodularity in the transition zone compatible with
benign prostatic hypertrophy.

Volume: 3D volumetric analysis: Prostate volume 42.08 cc (5.0 by
by 4.7 cm).

Transcapsular spread:  Absent

Seminal vesicle involvement: Absent

Neurovascular bundle involvement: Absent

Pelvic adenopathy: Absent

Bone metastasis: Absent

Other findings: Left groin hernia contains a loop of bowel.
IMPRESSION: 1. Two small PI-RADS category 3 lesions in the peripheral zone.
Targeting data sent to [facility].
2. Mild benign prostatic hypertrophy.
3. Left groin hernia contains a loop of bowel.

## 2022-07-01 ENCOUNTER — Telehealth: Payer: Self-pay

## 2022-07-01 NOTE — Addendum Note (Signed)
Addended by: Gerald Leitz A on: 07/01/2022 09:41 AM   Modules accepted: Orders

## 2022-07-01 NOTE — Progress Notes (Signed)
   [facility]  Surgery Date: Date: 08/02/2022  Surgeon: Dr. [name], MD  Inpt ( No  )   Outpt (No)   Obs ( Yes  )   Diagnosis: C61 Prostate Cancer  -CPT: [phone], [id number]  Surgery: Robotic Laparoscopic Prostatectomy with Bilateral Pelvic Lymph Node Dissection  Stop Anticoagulations: Yes and hold ASA  Cardiac/Medical/Pulmonary Clearance needed: no  *Orders entered into EPIC  Date: 07/01/22   *Case booked in EPIC  Date: 06/29/2022  *Notified pt of Surgery: Date: 06/29/2022  PRE-OP UA & CX: yes and will also obtain CBC, INR, BMP, Type and Screen,   *Placed into Prior Authorization Work Que Date: 07/01/22  Assistant/laser/rep:Yes Dr. Diamantina Providence to assist on this case

## 2022-07-01 NOTE — Telephone Encounter (Signed)
I spoke with Mr. [place] [employer] Case Worker Earl Lagos and Dss Director Candace Goble. We have discussed possible surgery dates and Monday February 26th, 2024 was agreed upon by all parties. Patient given information about surgery date, what to expect pre-operatively and post operatively.  We discussed that a Pre-Admission Testing office will be calling to set up the pre-op visit that will take place prior to surgery, and that these appointments are typically done over the phone with a Pre-Admissions RN. Informed patient that our office will communicate any additional care to be provided after surgery. Patients questions or concerns were discussed during our call. Advised to call our office should there be any additional information, questions or concerns that arise. Patient verbalized understanding.   Received verbal consent from [place] to schedule surgery and she will provide consent for Surgery.

## 2022-07-16 ENCOUNTER — Telehealth (HOSPITAL_COMMUNITY): Payer: Self-pay | Admitting: *Deleted

## 2022-07-16 NOTE — Telephone Encounter (Signed)
Pt's guardian Earl Lagos from [facility] called requesting a CCA for pt so that he may be eligible for day treatment. Guardian may be reached directly @ [phone].

## 2022-07-19 ENCOUNTER — Telehealth (HOSPITAL_COMMUNITY): Payer: Self-pay | Admitting: Licensed Clinical Social Worker

## 2022-07-19 NOTE — Telephone Encounter (Signed)
I do not do CCA.  Maybe a referral to therapist will be helpful.  We can provide medication progress note if that helps.

## 2022-07-19 NOTE — Telephone Encounter (Signed)
After consulting with his Supervisor, Mr. Beather Arbour, the therapist calls [place]'s Legal Guardian and explains several options for getting a CCA done for [facility] to hopeful start [place], [place], [place], [facility], [place] 07/19/2022

## 2022-07-20 NOTE — Telephone Encounter (Signed)
Thanks so much. 

## 2022-07-23 ENCOUNTER — Encounter
Admission: RE | Admit: 2022-07-23 | Discharge: 2022-07-23 | Disposition: A | Discharge status: Home / Self Care | Payer: Medicare HMO | Source: Ambulatory Visit | Attending: Urology | Admitting: Urology

## 2022-07-23 VITALS — Ht 76.0 in | Wt 152.8 lb

## 2022-07-23 DIAGNOSIS — I10 Essential (primary) hypertension: Secondary | ICD-10-CM

## 2022-07-23 DIAGNOSIS — Z01810 Encounter for preprocedural cardiovascular examination: Secondary | ICD-10-CM

## 2022-07-23 NOTE — Patient Instructions (Addendum)
Your procedure is scheduled on:08-02-22 Monday Report to the Registration Desk on the 1st floor of the [facility].Then proceed to the 2nd floor Surgery Desk  To find out your arrival time, please call [phone] between 1PM - 3PM on:07-30-22 Friday If your arrival time is 6:00 am, do not arrive before that time as the [facility] entrance doors do not open until 6:00 am.  REMEMBER: Instructions that are not followed completely may result in serious medical risk, up to and including death; or upon the discretion of your surgeon and anesthesiologist your surgery may need to be rescheduled.  Do not eat food OR drink any liquids after midnight the night before surgery.  No gum chewing or hard candies.  Clear liquids the entire day before surgery (Sunday 08-01-22) as instructed by Dr Cherrie Gauze office   One week prior to surgery:Last dose will be on 07-25-22  Stop Anti-inflammatories (NSAIDS) such as Advil, Aleve, Ibuprofen, Motrin, Naproxen, Naprosyn and Aspirin based products such as Excedrin, Goody's Powder, BC Powder.You may however, take Tylenol if needed for pain up until the day of surgery. Stop ANY OVER THE COUNTER supplements/vitamins until after surgery.  Continue taking all prescribed medications  TAKE ONLY THESE MEDICATIONS THE MORNING OF SURGERY WITH A SIP OF WATER: -benztropine (COGENTIN)  -methimazole (TAPAZOLE)  -tamsulosin (FLOMAX)   No Alcohol for 24 hours before or after surgery.  No Smoking including e-cigarettes for 24 hours before surgery.  No chewable tobacco products for at least 6 hours before surgery.  No nicotine patches on the day of surgery.  Do not use any "recreational" drugs for at least a week (preferably 2 weeks) before your surgery.  Please be advised that the combination of cocaine and anesthesia may have negative outcomes, up to and including death. If you test positive for cocaine, your surgery will be cancelled.  On the morning of surgery brush  your teeth with toothpaste and water, you may rinse your mouth with mouthwash if you wish. Do not swallow any toothpaste or mouthwash.  Use CHG Soap as directed on instruction sheet.  Do not wear jewelry, make-up, hairpins, clips or nail polish.  Do not wear lotions, powders, or perfumes.   Do not shave body hair from the neck down 48 hours before surgery.  Contact lenses, hearing aids and dentures may not be worn into surgery.  Do not bring valuables to the hospital. [facility] is not responsible for any missing/lost belongings or valuables.   Notify your doctor if there is any change in your medical condition (cold, fever, infection).  Wear comfortable clothing (specific to your surgery type) to the hospital.  After surgery, you can help prevent lung complications by doing breathing exercises.  Take deep breaths and cough every 1-2 hours. Your doctor may order a device called an Incentive Spirometer to help you take deep breaths. When coughing or sneezing, hold a pillow firmly against your incision with both hands. This is called "splinting." Doing this helps protect your incision. It also decreases belly discomfort.  If you are being admitted to the hospital overnight, leave your suitcase in the car. After surgery it may be brought to your room.  In case of increased patient census, it may be necessary for you, the patient, to continue your postoperative care in the Same Day Surgery department.  If you are being discharged the day of surgery, you will not be allowed to drive home. You will need a responsible individual to drive you home and  stay with you for 24 hours after surgery.   If you are taking public transportation, you will need to have a responsible individual with you.  Please call the [facility] Dept. at [phone] if you have any questions about these instructions.  Surgery Visitation Policy:  Patients undergoing a surgery or procedure may have two  family members or support persons with them as long as the person is not COVID-19 positive or experiencing its symptoms.   Inpatient Visitation:    Visiting hours are 7 a.m. to 8 p.m. Up to four visitors are allowed at one time in a patient room. The visitors may rotate out with other people during the day. One designated support person (adult) may remain overnight.  Due to an increase in RSV and influenza rates and associated hospitalizations, children ages 16 and under will not be able to visit patients in [facility]. Masks continue to be strongly recommended.     Preparing for Surgery with CHLORHEXIDINE GLUCONATE (CHG) Soap  Chlorhexidine Gluconate (CHG) Soap  o An antiseptic cleaner that kills germs and bonds with the skin to continue killing germs even after washing  o Used for showering the night before surgery and morning of surgery  Before surgery, you can play an important role by reducing the number of germs on your skin.  CHG (Chlorhexidine gluconate) soap is an antiseptic cleanser which kills germs and bonds with the skin to continue killing germs even after washing.  Please do not use if you have an allergy to CHG or antibacterial soaps. If your skin becomes reddened/irritated stop using the CHG.  1. Shower the NIGHT BEFORE SURGERY and the MORNING OF SURGERY with CHG soap.  2. If you choose to wash your hair, wash your hair first as usual with your normal shampoo.  3. After shampooing, rinse your hair and body thoroughly to remove the shampoo.  4. Use CHG as you would any other liquid soap. You can apply CHG directly to the skin and wash gently with a scrungie or a clean washcloth.  5. Apply the CHG soap to your body only from the neck down. Do not use on open wounds or open sores. Avoid contact with your eyes, ears, mouth, and genitals (private parts). Wash face and genitals (private parts) with your normal soap.  6. Wash thoroughly, paying special  attention to the area where your surgery will be performed.  7. Thoroughly rinse your body with warm water.  8. Do not shower/wash with your normal soap after using and rinsing off the CHG soap.  9. Pat yourself dry with a clean towel.  10. Wear clean pajamas to bed the night before surgery.  12. Place clean sheets on your bed the night of your first shower and do not sleep with pets.  13. Shower again with the CHG soap on the day of surgery prior to arriving at the hospital.  14. Do not apply any deodorants/lotions/powders.  15. Please wear clean clothes to the hospital.

## 2022-07-23 NOTE — Pre-Procedure Instructions (Signed)
Pt having surgery with Dr Erlene Quan on 08-02-22. Pt resides at a [facility] ([place]) and has a legal gaurdian who is Earl Lagos [phone]). Informed Earl Lagos that I am needing pts surgery consent signed and faxed back to [place] in PAT. Janett Billow gave me her fax # and she said she will get it to her director to sign and fax consent back to [place]. Faxed this to [place] with fax confirmation. I also informed Janett Billow that pt will need to come in for labs, EKG and urine per surgeon and anesthesia requirements. Janett Billow wants call back once I talk to group home and get date and time down so she can coordinate all this. Will call Janett Billow back once date and time is set. I told her that I also could not get in touch with group home so that I could get pts med list. We verified I had the correct # and she is sending me an updated FL2 with med list on it.

## 2022-07-23 NOTE — Pre-Procedure Instructions (Addendum)
Spoke with Chrystie Nose leader of group home regarding time to bring pt in for his EKG, labs and ua/ux. [name] states she is not in the office today and wants me to call the group home and speak with Joseph Art Poteat to set up time for this. Called group home and spoke with Joseph Art who gives date of 07-27-22 @ 1000. Earl Lagos legal gaurdian called as well and was given this information. Earl Lagos verbalized that she received my fax of the surgery consent. I did receive FL2 with meds that [place] sent over as well

## 2022-07-27 ENCOUNTER — Inpatient Hospital Stay: Admission: RE | Admit: 2022-07-27 | Payer: Medicare HMO | Source: Ambulatory Visit

## 2022-07-27 DIAGNOSIS — C61 Malignant neoplasm of prostate: Secondary | ICD-10-CM | POA: Diagnosis not present

## 2022-07-28 ENCOUNTER — Encounter
Admission: RE | Admit: 2022-07-28 | Discharge: 2022-07-28 | Disposition: A | Discharge status: Home / Self Care | Payer: Medicare HMO | Source: Ambulatory Visit | Attending: Urology | Admitting: Urology

## 2022-07-28 DIAGNOSIS — I10 Essential (primary) hypertension: Secondary | ICD-10-CM | POA: Diagnosis not present

## 2022-07-28 DIAGNOSIS — Z01810 Encounter for preprocedural cardiovascular examination: Secondary | ICD-10-CM

## 2022-07-28 DIAGNOSIS — Z01818 Encounter for other preprocedural examination: Secondary | ICD-10-CM | POA: Insufficient documentation

## 2022-07-28 DIAGNOSIS — C61 Malignant neoplasm of prostate: Secondary | ICD-10-CM | POA: Insufficient documentation

## 2022-07-28 DIAGNOSIS — R001 Bradycardia, unspecified: Secondary | ICD-10-CM | POA: Insufficient documentation

## 2022-07-28 LAB — CBC
HCT: 41.5 % (ref 39.0–52.0)
Hemoglobin: 13.5 g/dL (ref 13.0–17.0)
MCH: 31 pg (ref 26.0–34.0)
MCHC: 32.5 g/dL (ref 30.0–36.0)
MCV: 95.4 fL (ref 80.0–100.0)
Platelets: 220 10*3/uL (ref 150–400)
RBC: 4.35 MIL/uL (ref 4.22–5.81)
RDW: 12.2 % (ref 11.5–15.5)
WBC: 5.1 10*3/uL (ref 4.0–10.5)
nRBC: 0 % (ref 0.0–0.2)

## 2022-07-28 LAB — BASIC METABOLIC PANEL
Anion gap: 4 — ABNORMAL LOW (ref 5–15)
BUN: 17 mg/dL (ref 8–23)
CO2: 27 mmol/L (ref 22–32)
Calcium: 9.1 mg/dL (ref 8.9–10.3)
Chloride: 106 mmol/L (ref 98–111)
Creatinine, Ser: 1.04 mg/dL (ref 0.61–1.24)
GFR, Estimated: >60 mL/min (ref >60)
Glucose, Bld: 85 mg/dL (ref 70–99)
Potassium: 3.6 mmol/L (ref 3.5–5.1)
Sodium: 137 mmol/L (ref 135–145)

## 2022-07-28 LAB — TYPE AND SCREEN
ABO/RH(D): A POS
Antibody Screen: NEGATIVE

## 2022-07-28 LAB — URINALYSIS, COMPLETE (UACMP) WITH MICROSCOPIC
Bacteria, UA: NONE SEEN
Bilirubin Urine: NEGATIVE
Glucose, UA: NEGATIVE mg/dL
Ketones, ur: NEGATIVE mg/dL
Leukocytes,Ua: NEGATIVE
Nitrite: NEGATIVE
Protein, ur: NEGATIVE mg/dL
Specific Gravity, Urine: 1.016 (ref 1.005–1.030)
pH: 5 (ref 5.0–8.0)

## 2022-07-28 LAB — PROTIME-INR
INR: 1.2 (ref 0.8–1.2)
Prothrombin Time: 14.7 seconds (ref 11.4–15.2)

## 2022-07-29 LAB — URINE CULTURE: Culture: NO GROWTH

## 2022-07-30 NOTE — Progress Notes (Addendum)
Received surgery consent that was signed by Legal Gaurdian. Katie to take upstairs to SDS to place on chart.  Called [name] from group home to make sure she received pts bag with soap and surgery instructions. Went over instructions with her about pt only having clear liquids all day on Sunday as instructed by Dr Audree Bane office and then no liquids after midnight Sunday night. Explained what meds pt needs to take and explained CHG soap that was given. [name] verbalized understanding

## 2022-07-30 NOTE — Pre-Procedure Instructions (Signed)
Called Ian Cook at [facility] to check about the status of surgery consent that needs to be signed by legal guardian. Janett Billow states she just got it back this morning and she confirmed fax # and states she will fax it on over

## 2022-08-02 ENCOUNTER — Observation Stay
Admission: RE | Admit: 2022-08-02 | Discharge: 2022-08-03 | Disposition: A | Discharge status: Home / Self Care | Payer: Medicare HMO | Attending: Urology | Admitting: Urology

## 2022-08-02 ENCOUNTER — Encounter
Admission: RE | Disposition: A | Discharge status: Home / Self Care | Payer: Medicare HMO | Source: Home / Self Care | Attending: Urology

## 2022-08-02 ENCOUNTER — Other Ambulatory Visit: Payer: Self-pay

## 2022-08-02 ENCOUNTER — Encounter: Payer: Self-pay | Admitting: Urology

## 2022-08-02 ENCOUNTER — Ambulatory Visit: Payer: Medicare HMO | Admitting: Urgent Care

## 2022-08-02 ENCOUNTER — Ambulatory Visit: Payer: Medicare HMO | Admitting: Anesthesiology

## 2022-08-02 DIAGNOSIS — Z79899 Other long term (current) drug therapy: Secondary | ICD-10-CM | POA: Insufficient documentation

## 2022-08-02 DIAGNOSIS — K4090 Unilateral inguinal hernia, without obstruction or gangrene, not specified as recurrent: Secondary | ICD-10-CM | POA: Insufficient documentation

## 2022-08-02 DIAGNOSIS — D219 Benign neoplasm of connective and other soft tissue, unspecified: Secondary | ICD-10-CM | POA: Diagnosis not present

## 2022-08-02 DIAGNOSIS — I10 Essential (primary) hypertension: Secondary | ICD-10-CM | POA: Insufficient documentation

## 2022-08-02 DIAGNOSIS — F17210 Nicotine dependence, cigarettes, uncomplicated: Secondary | ICD-10-CM | POA: Diagnosis not present

## 2022-08-02 DIAGNOSIS — K4020 Bilateral inguinal hernia, without obstruction or gangrene, not specified as recurrent: Secondary | ICD-10-CM

## 2022-08-02 DIAGNOSIS — C61 Malignant neoplasm of prostate: Secondary | ICD-10-CM | POA: Diagnosis not present

## 2022-08-02 DIAGNOSIS — F419 Anxiety disorder, unspecified: Secondary | ICD-10-CM | POA: Diagnosis not present

## 2022-08-02 HISTORY — DX: Malignant neoplasm of prostate: C61

## 2022-08-02 HISTORY — PX: ROBOT ASSISTED LAPAROSCOPIC RADICAL PROSTATECTOMY: SHX5141

## 2022-08-02 HISTORY — PX: PELVIC LYMPH NODE DISSECTION: SHX6543

## 2022-08-02 LAB — ABO/RH: ABO/RH(D): A POS

## 2022-08-02 SURGERY — PROSTATECTOMY, RADICAL, ROBOT-ASSISTED, LAPAROSCOPIC
Anesthesia: General

## 2022-08-02 MED ORDER — LIDOCAINE HCL (CARDIAC) PF 100 MG/5ML IV SOSY
PREFILLED_SYRINGE | INTRAVENOUS | Status: DC | PRN
  Administered 2022-08-02: 60 mg via INTRAVENOUS

## 2022-08-02 MED ORDER — CHLORHEXIDINE GLUCONATE 0.12 % MT SOLN
OROMUCOSAL | Status: AC
  Administered 2022-08-02: 15 mL via OROMUCOSAL
  Filled 2022-08-02: qty 15

## 2022-08-02 MED ORDER — MORPHINE SULFATE (PF) 2 MG/ML IV SOLN: 2.0000 mg | INTRAVENOUS | Status: DC | PRN

## 2022-08-02 MED ORDER — CEFAZOLIN SODIUM-DEXTROSE 2-4 GM/100ML-% IV SOLN
INTRAVENOUS | Status: AC
  Filled 2022-08-02: qty 100

## 2022-08-02 MED ORDER — ONDANSETRON HCL 4 MG/2ML IJ SOLN
INTRAMUSCULAR | Status: AC
  Filled 2022-08-02: qty 2

## 2022-08-02 MED ORDER — SODIUM CHLORIDE 0.9 % IV SOLN: INTRAVENOUS | Status: DC

## 2022-08-02 MED ORDER — LATANOPROST 0.005 % OP SOLN
1.0000 [drp] | Freq: Every day | OPHTHALMIC | Status: DC
  Administered 2022-08-02: 1 [drp] via OPHTHALMIC
  Filled 2022-08-02: qty 2.5

## 2022-08-02 MED ORDER — AMLODIPINE BESYLATE 10 MG PO TABS
10.0000 mg | ORAL_TABLET | Freq: Every day | ORAL | Status: DC
  Administered 2022-08-02 – 2022-08-03 (×2): 10 mg via ORAL
  Filled 2022-08-02 (×2): qty 1

## 2022-08-02 MED ORDER — DOCUSATE SODIUM 100 MG PO CAPS
100.0000 mg | ORAL_CAPSULE | Freq: Two times a day (BID) | ORAL | Status: DC
  Administered 2022-08-02 – 2022-08-03 (×2): 100 mg via ORAL
  Filled 2022-08-02 (×2): qty 1

## 2022-08-02 MED ORDER — ORAL CARE MOUTH RINSE: 15.0000 mL | Freq: Once | OROMUCOSAL | Status: AC

## 2022-08-02 MED ORDER — DORZOLAMIDE HCL-TIMOLOL MAL 2-0.5 % OP SOLN
1.0000 [drp] | Freq: Two times a day (BID) | OPHTHALMIC | Status: DC
  Filled 2022-08-02: qty 10

## 2022-08-02 MED ORDER — ACETAMINOPHEN 325 MG PO TABS
650.0000 mg | ORAL_TABLET | ORAL | Status: DC | PRN
  Administered 2022-08-02 – 2022-08-03 (×2): 650 mg via ORAL
  Filled 2022-08-02 (×2): qty 2

## 2022-08-02 MED ORDER — DIPHENHYDRAMINE HCL 12.5 MG/5ML PO ELIX: 12.5000 mg | ORAL_SOLUTION | Freq: Four times a day (QID) | ORAL | Status: DC | PRN

## 2022-08-02 MED ORDER — SURGIFLO WITH THROMBIN (HEMOSTATIC MATRIX KIT) OPTIME
TOPICAL | Status: DC | PRN
  Administered 2022-08-02: 1 via TOPICAL

## 2022-08-02 MED ORDER — METHIMAZOLE 5 MG PO TABS
5.0000 mg | ORAL_TABLET | ORAL | Status: DC
  Administered 2022-08-03: 5 mg via ORAL
  Filled 2022-08-02: qty 1

## 2022-08-02 MED ORDER — OLANZAPINE 5 MG PO TABS
5.0000 mg | ORAL_TABLET | Freq: Every day | ORAL | Status: DC
  Administered 2022-08-02: 5 mg via ORAL
  Filled 2022-08-02: qty 1

## 2022-08-02 MED ORDER — OXYCODONE HCL 5 MG/5ML PO SOLN: 5.0000 mg | Freq: Once | ORAL | Status: DC | PRN

## 2022-08-02 MED ORDER — CEFAZOLIN SODIUM-DEXTROSE 2-4 GM/100ML-% IV SOLN
2.0000 g | INTRAVENOUS | Status: AC
  Administered 2022-08-02: 2 g via INTRAVENOUS

## 2022-08-02 MED ORDER — CHLORHEXIDINE GLUCONATE 4 % EX LIQD: 60.0000 mL | Freq: Once | CUTANEOUS | Status: DC

## 2022-08-02 MED ORDER — LACTATED RINGERS IV SOLN: INTRAVENOUS | Status: DC

## 2022-08-02 MED ORDER — MIDAZOLAM HCL 2 MG/2ML IJ SOLN
INTRAMUSCULAR | Status: DC | PRN
  Administered 2022-08-02: 2 mg via INTRAVENOUS

## 2022-08-02 MED ORDER — DIPHENHYDRAMINE HCL 50 MG/ML IJ SOLN: 12.5000 mg | Freq: Four times a day (QID) | INTRAMUSCULAR | Status: DC | PRN

## 2022-08-02 MED ORDER — BUPIVACAINE HCL (PF) 0.5 % IJ SOLN
INTRAMUSCULAR | Status: AC
  Filled 2022-08-02: qty 30

## 2022-08-02 MED ORDER — BENAZEPRIL HCL 20 MG PO TABS
20.0000 mg | ORAL_TABLET | Freq: Every day | ORAL | Status: DC
  Administered 2022-08-02 – 2022-08-03 (×2): 20 mg via ORAL
  Filled 2022-08-02 (×2): qty 1

## 2022-08-02 MED ORDER — BRIMONIDINE TARTRATE 0.2 % OP SOLN
1.0000 [drp] | Freq: Every day | OPHTHALMIC | Status: DC
  Administered 2022-08-03: 1 [drp] via OPHTHALMIC
  Filled 2022-08-02: qty 5

## 2022-08-02 MED ORDER — PROPOFOL 10 MG/ML IV BOLUS
INTRAVENOUS | Status: DC | PRN
  Administered 2022-08-02: 150 mg via INTRAVENOUS

## 2022-08-02 MED ORDER — EPHEDRINE SULFATE (PRESSORS) 50 MG/ML IJ SOLN
INTRAMUSCULAR | Status: DC | PRN
  Administered 2022-08-02 (×2): 10 mg via INTRAVENOUS

## 2022-08-02 MED ORDER — ATORVASTATIN CALCIUM 10 MG PO TABS
10.0000 mg | ORAL_TABLET | Freq: Every day | ORAL | Status: DC
  Administered 2022-08-02 – 2022-08-03 (×2): 10 mg via ORAL
  Filled 2022-08-02 (×2): qty 1

## 2022-08-02 MED ORDER — HYDROMORPHONE HCL 1 MG/ML IJ SOLN
INTRAMUSCULAR | Status: AC
  Administered 2022-08-02: 0.5 mg via INTRAVENOUS
  Filled 2022-08-02: qty 1

## 2022-08-02 MED ORDER — OXYCODONE-ACETAMINOPHEN 5-325 MG PO TABS: 1.0000 | ORAL_TABLET | ORAL | Status: DC | PRN

## 2022-08-02 MED ORDER — FENTANYL CITRATE (PF) 100 MCG/2ML IJ SOLN
INTRAMUSCULAR | Status: DC | PRN
  Administered 2022-08-02 (×2): 50 ug via INTRAVENOUS
  Administered 2022-08-02: 100 ug via INTRAVENOUS
  Administered 2022-08-02: 50 ug via INTRAVENOUS

## 2022-08-02 MED ORDER — OXYBUTYNIN CHLORIDE 5 MG PO TABS: 5.0000 mg | ORAL_TABLET | Freq: Three times a day (TID) | ORAL | Status: DC | PRN

## 2022-08-02 MED ORDER — STERILE WATER FOR IRRIGATION IR SOLN
Status: DC | PRN
  Administered 2022-08-02: 500 mL

## 2022-08-02 MED ORDER — HEPARIN SODIUM (PORCINE) 5000 UNIT/ML IJ SOLN
5000.0000 [IU] | Freq: Three times a day (TID) | INTRAMUSCULAR | Status: DC
  Administered 2022-08-02 – 2022-08-03 (×3): 5000 [IU] via SUBCUTANEOUS
  Filled 2022-08-02 (×3): qty 1

## 2022-08-02 MED ORDER — ZOLPIDEM TARTRATE 5 MG PO TABS
5.0000 mg | ORAL_TABLET | Freq: Every evening | ORAL | Status: DC | PRN
  Administered 2022-08-02: 5 mg via ORAL
  Filled 2022-08-02: qty 1

## 2022-08-02 MED ORDER — CEFAZOLIN SODIUM-DEXTROSE 1-4 GM/50ML-% IV SOLN
1.0000 g | Freq: Three times a day (TID) | INTRAVENOUS | Status: AC
  Administered 2022-08-02 – 2022-08-03 (×2): 1 g via INTRAVENOUS
  Filled 2022-08-02 (×2): qty 50

## 2022-08-02 MED ORDER — CHLORHEXIDINE GLUCONATE CLOTH 2 % EX PADS
6.0000 | MEDICATED_PAD | Freq: Every day | CUTANEOUS | Status: DC
  Administered 2022-08-03: 6 via TOPICAL

## 2022-08-02 MED ORDER — DORZOLAMIDE HCL 2 % OP SOLN
1.0000 [drp] | Freq: Two times a day (BID) | OPHTHALMIC | Status: DC
  Administered 2022-08-02 – 2022-08-03 (×2): 1 [drp] via OPHTHALMIC
  Filled 2022-08-02: qty 10

## 2022-08-02 MED ORDER — ONDANSETRON HCL 4 MG/2ML IJ SOLN
INTRAMUSCULAR | Status: DC | PRN
  Administered 2022-08-02: 4 mg via INTRAVENOUS

## 2022-08-02 MED ORDER — FAMOTIDINE 20 MG PO TABS: 20.0000 mg | ORAL_TABLET | Freq: Once | ORAL | Status: AC

## 2022-08-02 MED ORDER — FAMOTIDINE 20 MG PO TABS
ORAL_TABLET | ORAL | Status: AC
  Administered 2022-08-02: 20 mg via ORAL
  Filled 2022-08-02: qty 1

## 2022-08-02 MED ORDER — HYDROMORPHONE HCL 1 MG/ML IJ SOLN
0.2500 mg | INTRAMUSCULAR | Status: DC | PRN
  Administered 2022-08-02 (×2): 0.5 mg via INTRAVENOUS

## 2022-08-02 MED ORDER — CHLORHEXIDINE GLUCONATE 0.12 % MT SOLN: 15.0000 mL | Freq: Once | OROMUCOSAL | Status: AC

## 2022-08-02 MED ORDER — SUGAMMADEX SODIUM 200 MG/2ML IV SOLN
INTRAVENOUS | Status: DC | PRN
  Administered 2022-08-02: 200 mg via INTRAVENOUS

## 2022-08-02 MED ORDER — MIDAZOLAM HCL 2 MG/2ML IJ SOLN
INTRAMUSCULAR | Status: AC
  Filled 2022-08-02: qty 2

## 2022-08-02 MED ORDER — ROCURONIUM BROMIDE 100 MG/10ML IV SOLN
INTRAVENOUS | Status: DC | PRN
  Administered 2022-08-02: 40 mg via INTRAVENOUS
  Administered 2022-08-02: 10 mg via INTRAVENOUS

## 2022-08-02 MED ORDER — ONDANSETRON HCL 4 MG/2ML IJ SOLN
4.0000 mg | INTRAMUSCULAR | Status: DC | PRN
  Administered 2022-08-03: 4 mg via INTRAVENOUS
  Filled 2022-08-02: qty 2

## 2022-08-02 MED ORDER — PROPOFOL 10 MG/ML IV BOLUS
INTRAVENOUS | Status: AC
  Filled 2022-08-02: qty 20

## 2022-08-02 MED ORDER — OXYCODONE HCL 5 MG PO TABS: 5.0000 mg | ORAL_TABLET | Freq: Once | ORAL | Status: DC | PRN

## 2022-08-02 MED ORDER — SEVOFLURANE IN SOLN
RESPIRATORY_TRACT | Status: AC
  Filled 2022-08-02: qty 250

## 2022-08-02 MED ORDER — TIMOLOL MALEATE 0.5 % OP SOLN
1.0000 [drp] | Freq: Two times a day (BID) | OPHTHALMIC | Status: DC
  Administered 2022-08-02 – 2022-08-03 (×2): 1 [drp] via OPHTHALMIC
  Filled 2022-08-02: qty 5

## 2022-08-02 MED ORDER — DEXAMETHASONE SODIUM PHOSPHATE 10 MG/ML IJ SOLN
INTRAMUSCULAR | Status: AC
  Filled 2022-08-02: qty 1

## 2022-08-02 MED ORDER — BENZTROPINE MESYLATE 0.5 MG PO TABS
0.5000 mg | ORAL_TABLET | ORAL | Status: DC
  Administered 2022-08-03: 0.5 mg via ORAL
  Filled 2022-08-02: qty 1

## 2022-08-02 MED ORDER — DEXAMETHASONE SODIUM PHOSPHATE 10 MG/ML IJ SOLN
INTRAMUSCULAR | Status: DC | PRN
  Administered 2022-08-02: 10 mg via INTRAVENOUS

## 2022-08-02 MED ORDER — BUPIVACAINE HCL 0.5 % IJ SOLN
INTRAMUSCULAR | Status: DC | PRN
  Administered 2022-08-02: 20 mL

## 2022-08-02 MED ORDER — FENTANYL CITRATE (PF) 250 MCG/5ML IJ SOLN
INTRAMUSCULAR | Status: AC
  Filled 2022-08-02: qty 5

## 2022-08-02 MED ORDER — HEMOSTATIC AGENTS (NO CHARGE) OPTIME
TOPICAL | Status: DC | PRN
  Administered 2022-08-02: 2 via TOPICAL

## 2022-08-02 SURGICAL SUPPLY — 89 items
ANCHOR TIS RET SYS 235ML (MISCELLANEOUS) ×2 IMPLANT
APPLICATOR SURGIFLO ENDO (HEMOSTASIS) ×2 IMPLANT
APPLIER CLIP LOGIC TI 5 (MISCELLANEOUS) IMPLANT
BAG URINE DRAIN 2000ML AR STRL (UROLOGICAL SUPPLIES) ×2 IMPLANT
BLADE CLIPPER SURG (BLADE) ×2 IMPLANT
BULB RESERV EVAC DRAIN JP 100C (MISCELLANEOUS) IMPLANT
CATH DRAINAGE MALECOT 26FR (CATHETERS) ×2 IMPLANT
CATH FOL 2WAY LX 18X5 (CATHETERS) ×4 IMPLANT
CATH MALECOT (CATHETERS) ×2
CLIP LIGATING HEM O LOK PURPLE (MISCELLANEOUS) ×8 IMPLANT
COVER TIP SHEARS 8 DVNC (MISCELLANEOUS) ×2 IMPLANT
COVER TIP SHEARS 8MM DA VINCI (MISCELLANEOUS) ×2
DEFOGGER SCOPE WARMER CLEARIFY (MISCELLANEOUS) ×2 IMPLANT
DERMABOND ADVANCED .7 DNX12 (GAUZE/BANDAGES/DRESSINGS) ×2 IMPLANT
DRAIN CHANNEL JP 15F RND 16 (MISCELLANEOUS) IMPLANT
DRAIN CHANNEL JP 19F (MISCELLANEOUS) IMPLANT
DRAPE 3/4 80X56 (DRAPES) ×2 IMPLANT
DRAPE ARM DVNC X/XI (DISPOSABLE) ×8 IMPLANT
DRAPE COLUMN DVNC XI (DISPOSABLE) ×2 IMPLANT
DRAPE DA VINCI XI ARM (DISPOSABLE) ×8
DRAPE DA VINCI XI COLUMN (DISPOSABLE) ×2
DRAPE LEGGINS SURG 28X43 STRL (DRAPES) ×2 IMPLANT
DRAPE SURG 17X11 SM STRL (DRAPES) ×8 IMPLANT
DRAPE UNDER BUTTOCK W/FLU (DRAPES) ×2 IMPLANT
DRSG TELFA 3X8 NADH STRL (GAUZE/BANDAGES/DRESSINGS) ×2 IMPLANT
ELECT REM PT RETURN 9FT ADLT (ELECTROSURGICAL) ×2
ELECTRODE REM PT RTRN 9FT ADLT (ELECTROSURGICAL) ×2 IMPLANT
GLOVE BIO SURGEON STRL SZ 6.5 (GLOVE) ×4 IMPLANT
GLOVE SURG UNDER POLY LF SZ7.5 (GLOVE) ×2 IMPLANT
GOWN STRL REUS W/ TWL LRG LVL3 (GOWN DISPOSABLE) ×4 IMPLANT
GOWN STRL REUS W/ TWL XL LVL3 (GOWN DISPOSABLE) ×2 IMPLANT
GOWN STRL REUS W/TWL LRG LVL3 (GOWN DISPOSABLE) ×4
GOWN STRL REUS W/TWL XL LVL3 (GOWN DISPOSABLE) ×4
GRASPER SUT TROCAR 14GX15 (MISCELLANEOUS) ×2 IMPLANT
HEMOSTAT SURGICEL 2X14 (HEMOSTASIS) IMPLANT
HOLDER FOLEY CATH W/STRAP (MISCELLANEOUS) ×2 IMPLANT
IRRIGATION STRYKERFLOW (MISCELLANEOUS) ×2 IMPLANT
IRRIGATOR STRYKERFLOW (MISCELLANEOUS) ×2
KIT PINK PAD W/HEAD ARE REST (MISCELLANEOUS) ×2
KIT PINK PAD W/HEAD ARM REST (MISCELLANEOUS) ×2 IMPLANT
LABEL OR SOLS (LABEL) ×2 IMPLANT
MANIFOLD NEPTUNE II (INSTRUMENTS) ×2 IMPLANT
MARKER SKIN DUAL TIP RULER LAB (MISCELLANEOUS) ×2 IMPLANT
NDL INSUFFLATION 14GA 120MM (NEEDLE) ×2 IMPLANT
NEEDLE HYPO 22GX1.5 SAFETY (NEEDLE) ×2 IMPLANT
NEEDLE INSUFFLATION 14GA 120MM (NEEDLE) ×2 IMPLANT
NS IRRIG 500ML POUR BTL (IV SOLUTION) ×2 IMPLANT
OBTURATOR OPTICAL STANDARD 8MM (TROCAR) ×2
OBTURATOR OPTICAL STND 8 DVNC (TROCAR) ×2
OBTURATOR OPTICALSTD 8 DVNC (TROCAR) ×2 IMPLANT
PACK LAP CHOLECYSTECTOMY (MISCELLANEOUS) ×2 IMPLANT
PENCIL SMOKE EVACUATOR (MISCELLANEOUS) ×2 IMPLANT
RELOAD STAPLE 60 2.6 WHT THN (STAPLE) IMPLANT
RELOAD STAPLER WHITE 60MM (STAPLE) ×2 IMPLANT
SEAL CANN UNIV 5-8 DVNC XI (MISCELLANEOUS) ×8 IMPLANT
SEAL XI 5MM-8MM UNIVERSAL (MISCELLANEOUS) ×8
SET TUBE SMOKE EVAC HIGH FLOW (TUBING) ×2 IMPLANT
SOL ELECTROSURG ANTI STICK (MISCELLANEOUS) ×2
SOLUTION ELECTROSURG ANTI STCK (MISCELLANEOUS) ×2 IMPLANT
SPONGE T-LAP 4X18 ~~LOC~~+RFID (SPONGE) ×2 IMPLANT
SPONGE VERSALON 4X4 4PLY (MISCELLANEOUS) IMPLANT
STAPLE ECHEON FLEX 60 POW ENDO (STAPLE) IMPLANT
STAPLER RELOAD WHITE 60MM (STAPLE) ×2
STAPLER SKIN PROX 35W (STAPLE) ×2 IMPLANT
STRAP SAFETY 5IN WIDE (MISCELLANEOUS) ×4 IMPLANT
SURGIFLO W/THROMBIN 8M KIT (HEMOSTASIS) ×2 IMPLANT
SURGILUBE 2OZ TUBE FLIPTOP (MISCELLANEOUS) ×2 IMPLANT
SUT DVC VLOC 90 3-0 CV23 UNDY (SUTURE) ×4 IMPLANT
SUT DVC VLOC 90 3-0 CV23 VLT (SUTURE) ×2
SUT ETHILON 3-0 FS-10 30 BLK (SUTURE)
SUT MNCRL 4-0 (SUTURE) ×4
SUT MNCRL 4-0 27XMFL (SUTURE) ×4
SUT SILK 2 0 SH (SUTURE) IMPLANT
SUT VIC AB 0 CT1 36 (SUTURE) ×4 IMPLANT
SUT VIC AB 2-0 SH 27 (SUTURE)
SUT VIC AB 2-0 SH 27XBRD (SUTURE) IMPLANT
SUT VICRYL 0 UR6 27IN ABS (SUTURE) ×2 IMPLANT
SUTURE DVC VLC 90 3-0 CV23 VLT (SUTURE) ×2 IMPLANT
SUTURE EHLN 3-0 FS-10 30 BLK (SUTURE) IMPLANT
SUTURE MNCRL 4-0 27XMF (SUTURE) ×4 IMPLANT
SYR 10ML LL (SYRINGE) ×2 IMPLANT
SYR BULB IRRIG 60ML STRL (SYRINGE) ×2 IMPLANT
SYR TOOMEY 50ML (SYRINGE) ×4 IMPLANT
TAPE CLOTH 3X10 WHT NS LF (GAUZE/BANDAGES/DRESSINGS) ×4 IMPLANT
TRAP FLUID SMOKE EVACUATOR (MISCELLANEOUS) ×2 IMPLANT
TROCAR XCEL 12X100 BLDLESS (ENDOMECHANICALS) ×2 IMPLANT
TROCAR XCEL NON-BLD 5MMX100MML (ENDOMECHANICALS) ×2 IMPLANT
WATER STERILE IRR 3000ML UROMA (IV SOLUTION) ×2 IMPLANT
WATER STERILE IRR 500ML POUR (IV SOLUTION) ×2 IMPLANT

## 2022-08-02 NOTE — Transfer of Care (Signed)
Immediate Anesthesia Transfer of Care Note  Patient: Ian Cook  Procedure(s) Performed: XI ROBOTIC ASSISTED LAPAROSCOPIC RADICAL PROSTATECTOMY PELVIC LYMPH NODE DISSECTION (Bilateral)  Patient Location: PACU  Anesthesia Type:General  Level of Consciousness: drowsy  Airway & Oxygen Therapy: Patient Spontanous Breathing and Patient connected to face mask oxygen  Post-op Assessment: Report given to RN and Post -op Vital signs reviewed and stable  Post vital signs: Reviewed and stable  Last Vitals:  Vitals Value Taken Time  BP 118/75 08/02/22 1405  Temp 36.3 C 08/02/22 1405  Pulse 74 08/02/22 1405  Resp 17 08/02/22 1405  SpO2 98 % 08/02/22 1405    Last Pain:  Vitals:   08/02/22 1031  TempSrc: Temporal  PainSc: 0-No pain         Complications: No notable events documented.

## 2022-08-02 NOTE — H&P (Signed)
08/02/22 R[facility] CTAB  Intermediate risk prostate cancer previously on active surveillance with progression  Symptoms conversation with the patient previously along with his legal guardian/healthcare power of attorney Ian Cook.  All questions answered.  Ian Cook 05-Oct-1960 [id number]   Referring provider: [name], MD [address] [place],  [place] [address]      Chief Complaint  Patient presents with   discuss surgery      HPI: 62 year-old male with a personal history of prostate cancer, here today to discuss surgery.  He is s/p eval with rad onc but leaning towards surgery.     He underwent a prostate biopsy on 04/15/2021 that revealed Gleason 3+3=6 affecting 8 cores.   MRI on 10/22/2021 visualized two small PI-RADS category 3 lesions in the peripheral zone and mild benign prostatic hypertrophy.  He had no evidence of extracapsular disease, no lymphadenopathy or bone mets at this point in time. Trus volume 50 grams.   His most recent PSA was 4.8 on 04/27/22.    He is accompanied by the group home leader Ian Cook who know the patient well.  He has a legal guardian appointed by [facility] since 2017 but name of this person is unknown to patient, Ian Cook.  Ian Cook is NOT his legal guardian.     He is a current smoker, a pack a day. Not interested in    PMH:     Past Medical History:  Diagnosis Date   Anxiety     Glaucoma     HTN (hypertension) 11/01/2014   Hypertension     Schizo-affective schizophrenia ([place])     Schizoaffective disorder, manic type ([place]) 10/30/2014   Tobacco use disorder 11/01/2014      Surgical History:      Past Surgical History:  Procedure Laterality Date   TONSILLECTOMY          Home Medications:  Allergies as of 06/16/2022   No Known Allergies         Medication List           Accurate as of June 16, 2022  1:25 PM. If you have any questions, ask your nurse or doctor.              amLODipine-benazepril  10-20 MG capsule Commonly known as: LOTREL Take 1 capsule by mouth daily.    atorvastatin 10 MG tablet Commonly known as: LIPITOR Take 10 mg by mouth daily.    benztropine 0.5 MG tablet Commonly known as: COGENTIN Take 1 tablet (0.5 mg total) by mouth daily.    dorzolamide-timolol 2-0.5 % ophthalmic solution Commonly known as: COSOPT    finasteride 5 MG tablet Commonly known as: PROSCAR Take 1 tablet (5 mg total) by mouth daily.    methimazole 5 MG tablet Commonly known as: TAPAZOLE Take 5 mg by mouth.    OLANZapine 5 MG tablet Commonly known as: ZYPREXA Take 1 tablet (5 mg total) by mouth at bedtime.    tamsulosin 0.4 MG Caps capsule Commonly known as: FLOMAX    traZODone 100 MG tablet Commonly known as: DESYREL Take 1 tablet (100 mg total) by mouth at bedtime.             Family History:      Family History  Problem Relation Age of Onset   Aneurysm Mother     Depression Mother     CAD Father     Kidney cancer Neg Hx     Prostate cancer  Neg Hx        Social History:  reports that he has been smoking cigarettes. He has a 29.25 pack-year smoking history. He has been exposed to tobacco smoke. He has never used smokeless tobacco. He reports that he does not drink alcohol and does not use drugs.   Physical Exam: BP 139/83   Pulse 62   Ht '6\' 4"'$  (1.93 m)   BMI 18.60 kg/m   Constitutional:  Alert and oriented, No acute distress. HEENT: [place] AT, moist mucus membranes.  Trachea midline, no masses. Neurologic: Grossly intact, no focal deficits, moving all 4 extremities. Psychiatric: Normal mood and affect.   Assessment & Plan:     Prostate cancer  - He opted for surgery. He was accompanied by [name] the group home leader today and she also agrees that surgery is best and that he would do fine with it.    The patient was counseled about the natural history of prostate cancer and the standard treatment options that are available for prostate cancer. It was  explained to him how his age and life expectancy, clinical stage, Gleason score, and PSA affect his prognosis, the decision to proceed with additional staging studies, as well as how that information influences recommended treatment strategies. We discussed the roles for active surveillance, radiation therapy, surgical therapy, androgen deprivation, as well as ablative therapy options for the treatment of prostate cancer as appropriate to his individual cancer situation. We discussed the risks and benefits of these options with regard to their impact on cancer control and also in terms of potential adverse events, complications, and impact on quality of life particularly related to urinary, bowel, and sexual function. The patient was encouraged to ask questions throughout the discussion today and all questions were answered to his stated satisfaction. In addition, the patient was provided with and/or directed to appropriate resources and literature for further education about prostate cancer treatment options.   We discussed surgical therapy for prostate cancer including the different available surgical approaches.  Specifically, we discussed robotic prostatectomy with pelvic lymph node dissection based on his restratification.  We discussed, in detail, the risks and expectations of surgery with regard to cancer control, urinary control, and erectile dysfunction as well as expected post operative recovery processed. Additional risks of surgery including but not limited to bleeding, infection, hernia formation, nerve damage, fistula formation, bowel/rectal injury, potentially necessitating colostomy, damage to the urinary tract resulting in urinary leakage, urethral stricture, and cardiopulmonary risk such as myocardial infarction, stroke, death, thromboembolism etc. were explained.    -We'll refer him to preoperative physical therapy. We'll also need to get his actual healthcare legal guardian to sign off and be  in agreement with this plan.    2. Smoker - Discussed the risks of continued smoking as it relates to surgery. Strongly recommend cutting back or quitting prior to surgery. He's not interested or willing to do this.   I have reviewed the above documentation for accuracy and completeness, and I agree with the above.    [name], MD     [facility] [address], [place], [place] [address] [phone]   I spent 35 total minutes on the day of the encounter including pre-visit review of the medical record, face-to-face time with the patient, and post visit ordering of labs/imaging/tests.

## 2022-08-02 NOTE — Progress Notes (Signed)
Mobility Specialist - Progress Note   08/02/22 1606  Mobility  Activity Ambulated with assistance to bathroom  Level of Assistance Standby assist, set-up cues, supervision of patient - no hands on  Assistive Device None  Distance Ambulated (ft) 6 ft  Activity Response Tolerated well  $Mobility charge 1 Mobility   MS responding to bed alarm. Pt supine upon entry utilizing RA. Pt disconnected from O2 upon entry with cord hanging EOB, unsure how long. Pt O2 99% on RA, denies dizziness or SOB. Pt STS from bed and amb to the bathroom SBA no AD, tolerated well. Pt left sitting on commode, O2 >90% with call bell within reach.   Candie [place] Mobility Specialist 08/02/22 4:10 PM

## 2022-08-02 NOTE — Plan of Care (Signed)
  Problem: Education: Goal: Knowledge of the procedure and recovery process will improve Outcome: Progressing   Problem: Bowel/Gastric: Goal: Gastrointestinal status for postoperative course will improve Outcome: Progressing   Problem: Pain Management: Goal: General experience of comfort will improve Outcome: Progressing   Problem: Skin Integrity: Goal: Demonstration of wound healing without infection will improve Outcome: Progressing   Problem: Urinary Elimination: Goal: Ability to avoid or minimize complications of infection will improve Outcome: Progressing Goal: Ability to achieve and maintain urine output will improve Outcome: Progressing Goal: Home care management will improve Outcome: Progressing   Problem: Education: Goal: Knowledge of General Education information will improve Description: Including pain rating scale, medication(s)/side effects and non-pharmacologic comfort measures Outcome: Progressing

## 2022-08-02 NOTE — Op Note (Signed)
08/02/22  PREOPERATIVE DIAGNOSIS: Prostate cancer.  POSTOPERATIVE DIAGNOSIS: Prostate cancer. Bilateral inguinal hernia  OPERATION PERFORMED: 1. DaVinci laparoscopic radical prostatectomy (bilateral partial nerve sparing) 2 DaVinci laproscopic bilateral pelvic lymph node dissection.  SURGEON: [name], MD  ASSISTANTS: Nickolas Madrid, MD  ANESTHESIA: General.  EBL: 50 cc  SPECIMEN: Prostate with bilateral seminal vesicals, bilateral pelvic lymph nodes, anterior fat pad.  FINDINGS: Accessory Pudendal Vessel: none  INDICATION: Pt.is a16 year old male with Gleason 3+4prostate cancer. Treatment options were discussed with him at length and he chose DaVinci radical prostatectomy.  Bilateral pelvic lymph node dissection was planned due to his risk stratification.  PROCEDURE IN DETAIL: Patient was given appropriate perioperative antibiotics. He had sequential compression devices applied preoperatively for DVT prophylaxis. He was taken to the operating room where he was induced with general anesthesia. After adequate anesthesia, he was placed in the dorsal lithotomy position. His arms were draped by his side and was appropriately padded and secured to the operating room table. He was placed in the Trendelenburg position.  He was prepped and draped in sterile fashion. An 12 French Foley was placed in the bladder and instilled with 15 cc sterile water. Orogastric tube was placed. The Veress needle was passed just above the umbilicus and the abdomen was insufflated to 15 atmospheres. An 8 mm, blunt-tip trocar was placed just above the umbilicus. The zero-degree camera was passed within this and the following trocars were placed under direct vision; 8 mm robotic trocars were placed 9 cm laterally and inferiorly to the initially placed umbilical trocar. A third one was placed 7 cm lateral to the left-sided trocar. In the corresponding position on the right side, a 12 mm  trocar was placed, and then a 5 mm trocar was placed to the right and well above the umbilicus.  The 12 mm assistant port site was preclosed using 0 Vicryl suture using a Carter-Thomason which was tied down at the end of the case to close this port site.  The robot was then docked with the robot trocar. I used the zero-degree camera. I had the hot scissors in the right hand and the left hand with the [place] bipolar and far left hand the Prograsp forceps. Initially I divided the median umbilical ligament bilaterally and the urachus and developed the space of Retzius down to pubic bone. I divided the parietal peritoneum laterally up to the vas deferens on each side. I used the prograsp forceps to provide cranial traction on the urachus.  Notably, the patient had bilateral inguinal hernias which are primarily fat-containing and possibly when a portion of the bladder.  The hernia sac was separated from the fat in order to take down the bladder.  I cleaned off the Endopelvic fascia on each side and then divided it with the scissors laterally to the perirectal fat and medially to the puboprostatic ligaments which were divided. I then ligated the dorsal vein complex using a 60 mm vascular load stapler.   I identified the bladder neck by pulling on the Foley catheter. I divided the anterior bladder neck musculature until I then found the anterior bladder neck mucosa which was incised. I identified the Foley catheter within, deflated the balloon, pulled the Foley out through this opening and then using the Carter-Thomason needle with a #0-Vicryl suture, passed through The suprapubic region and pulled the suture through the eye of the Foley and then back out. This allowed me to provide upward traction on the prostate. I then divided the lateral  bladder neck mucosa and the posterior bladder neck mucosa. I was well away from ureteral orifices. I divided the posterior bladder neck musculature until  I identified the vas deferens. They were freed proximally, then divided. I freed up the seminal vesicals using blunt and sharp dissection. Judicious use of electrocautery was used near the seminal vesicle tips to avoid injury to neurovascular bundle.   I then went back to the 0-degree lens. I divided the Denonvilliers fascia beneath the prostate and developed the prostate off the rectum. I then did a partial nerve sparing by  creating a plane which was intrafascial. I then isolated the pedicles of the prostate and placed weck clips on the pedicles of the prostate and then divided it with cold scissors. I continued to divide the neurovascular bundles off the prostate out to the apex of the prostate. At this point the prostate was freed up except for the urethra. I addressed the prostate anteriorly, divided the dorsal vein , then the anterior urethral wall, pulled the Foley catheter back and then divided the posterior urethral wall. Specimen was completely freed up. I placed the prostate in an Endo catch bag and then placed the bag in the upper abdomen out of the way. I then irrigated the pelvis. The rectal test was negative. There was reasonable hemostasis.  I then did the pelvic lymph node dissection by incising the fascia overlying the right external iliac vein, dissecting distally. I went just distal to the node of [place] where we placed clips and then divided the lymphatics. The lateral aspect of the dissection was the pelvic side wall, inferior was the obturator nerve and proximal the hypogastric vessels. I placed clips at the proximal aspect and then divided the lymphatics. This was removed with the spoon grasper and sent to pathology.   I then did the left obturator lymph node dissection in the same fashion as the left side.  With good hemostasis, I then did the posterior reconstruction. I used a 3-0 VLoc suture through the cut edge of Denonvilliers fascia beneath the bladder  on the right side and through the posterior striated sphincter underneath the urethra. This brought the bladder neck and urethra and closer proximity to help facilitate anastomosis.   I then did the urethral vesicle anastomosis again with two 3-0 VLoc sutures interlocked. I passed both ends of the suture from the outside-in through the bladder neck at the 6 o'clock position. I passed both through the urethral stump from the inside-out in the corresponding position. I reapproximated the bladder neck to the urethra. I then ran the Left suture on the left side anastomosis to the 9 o'clock position. Then I went back to the right sided suture and ran that up the right side to the 12 o'clock position. I then continued the left suture to the 12 o'clock position.    I then placed a new 5 French Foley into the bladder and filled it with 10 cc sterile water. I irrigated the bladder with 160 cc. There was no leakage. There was reasonable hemostasis.  Surgicel was used on either side of the pedicles for an additional hemostasis.  Surgi-Flo was also used. The instruments were then removed. The robot was undocked and all the trocars were removed under direct vision. There was good hemostasis. I then enlarged the umbilical trocar site large enough to remove the prostate and I closed the fascia here with #0-0 Vicryl suture in a running fashion. All the port sites were irrigated. Lidocaine was  injected into all the trocar sites. The skin was closed with 4-0 Monocryl in running subcuticular fashion. Dermabond was applied.   At this point patient was awakened and extubated in the operating room and taken to the recovery room in stable condition. There were no complications. All counts correct.  An assistant was required for this surgical procedure. The duties of the assistant included but were not limited to suctioning, passing suture, camera manipulation, retraction. This procedure would not be  able to be performed without an Environmental consultant.    [name], MD

## 2022-08-02 NOTE — Anesthesia Preprocedure Evaluation (Addendum)
Anesthesia Evaluation  Patient identified by MRN, date of birth, ID band Patient awake    Reviewed: Allergy & Precautions, NPO status , Patient's Chart, lab work & pertinent test results  History of Anesthesia Complications Negative for: history of anesthetic complications  Airway Mallampati: III  TM Distance: >3 FB Neck ROM: full    Dental  (+) Poor Dentition   Pulmonary Current SmokerPatient did not abstain from smoking.   Pulmonary exam normal        Cardiovascular hypertension, Normal cardiovascular exam     Neuro/Psych  PSYCHIATRIC DISORDERS Anxiety   Schizophrenia  negative neurological ROS     GI/Hepatic negative GI ROS, Neg liver ROS,,,  Endo/Other  negative endocrine ROS    Renal/GU      Musculoskeletal   Abdominal   Peds  Hematology negative hematology ROS (+)   Anesthesia Other Findings Past Medical History: No date: Anxiety No date: Glaucoma 11/01/2014: HTN (hypertension) No date: Hypertension No date: Schizo-affective schizophrenia ([place]) 10/30/2014: Schizoaffective disorder, manic type ([place]) 11/01/2014: Tobacco use disorder  Past Surgical History: No date: TONSILLECTOMY     Reproductive/Obstetrics negative OB ROS                             Anesthesia Physical Anesthesia Plan  ASA: 2  Anesthesia Plan: General ETT   Post-op Pain Management: Toradol IV (intra-op)*, Ofirmev IV (intra-op)* and Dilaudid IV   Induction: Intravenous  PONV Risk Score and Plan: 3 and Ondansetron, Dexamethasone, Midazolam and Treatment may vary due to age or medical condition  Airway Management Planned: Oral ETT  Additional Equipment:   Intra-op Plan:   Post-operative Plan: Extubation in OR  Informed Consent: I have reviewed the patients History and Physical, chart, labs and discussed the procedure including the risks, benefits and alternatives for the proposed anesthesia with the  patient or authorized representative who has indicated his/her understanding and acceptance.     Dental Advisory Given and Consent reviewed with POA  Plan Discussed with: Anesthesiologist, CRNA and Surgeon  Anesthesia Plan Comments: (Patient consented for risks of anesthesia including but not limited to:  - adverse reactions to medications - damage to eyes, teeth, lips or other oral mucosa - nerve damage due to positioning  - sore throat or hoarseness - Damage to heart, brain, nerves, lungs, other parts of body or loss of life  Patient voiced understanding.)        Anesthesia Quick Evaluation

## 2022-08-02 NOTE — Anesthesia Procedure Notes (Signed)
Procedure Name: Intubation Date/Time: 08/02/2022 11:49 AM  Performed by: Doreen Salvage, CRNAPre-anesthesia Checklist: Patient identified, Patient being monitored, Timeout performed, Emergency Drugs available and Suction available Patient Re-evaluated:Patient Re-evaluated prior to induction Oxygen Delivery Method: Circle system utilized Preoxygenation: Pre-oxygenation with 100% oxygen Induction Type: IV induction Ventilation: Mask ventilation without difficulty Laryngoscope Size: Mac and 3 Grade View: Grade I Tube type: Oral Tube size: 7.5 mm Number of attempts: 1 Airway Equipment and Method: Stylet Placement Confirmation: ETT inserted through vocal cords under direct vision, positive ETCO2 and breath sounds checked- equal and bilateral Secured at: 23 cm Tube secured with: Tape Dental Injury: Teeth and Oropharynx as per pre-operative assessment

## 2022-08-02 NOTE — Anesthesia Postprocedure Evaluation (Signed)
Anesthesia Post Note  Patient: Benyam Sproul  Procedure(s) Performed: XI ROBOTIC ASSISTED LAPAROSCOPIC RADICAL PROSTATECTOMY PELVIC LYMPH NODE DISSECTION (Bilateral)  Patient location during evaluation: PACU Anesthesia Type: General Level of consciousness: awake and alert Pain management: pain level controlled Vital Signs Assessment: post-procedure vital signs reviewed and stable Respiratory status: spontaneous breathing, nonlabored ventilation, respiratory function stable and patient connected to nasal cannula oxygen Cardiovascular status: blood pressure returned to baseline and stable Postop Assessment: no apparent nausea or vomiting Anesthetic complications: no   No notable events documented.   Last Vitals:  Vitals:   08/02/22 1415 08/02/22 1423  BP: 123/73   Pulse: 80 75  Resp: 17 18  Temp:    SpO2: 100% 97%    Last Pain:  Vitals:   08/02/22 1423  TempSrc:   PainSc: 9                  Ilene Qua

## 2022-08-03 ENCOUNTER — Encounter: Payer: Self-pay | Admitting: Urology

## 2022-08-03 DIAGNOSIS — C61 Malignant neoplasm of prostate: Secondary | ICD-10-CM | POA: Diagnosis not present

## 2022-08-03 DIAGNOSIS — Z79899 Other long term (current) drug therapy: Secondary | ICD-10-CM | POA: Diagnosis not present

## 2022-08-03 DIAGNOSIS — D219 Benign neoplasm of connective and other soft tissue, unspecified: Secondary | ICD-10-CM | POA: Diagnosis not present

## 2022-08-03 DIAGNOSIS — K4090 Unilateral inguinal hernia, without obstruction or gangrene, not specified as recurrent: Secondary | ICD-10-CM | POA: Diagnosis not present

## 2022-08-03 DIAGNOSIS — F17210 Nicotine dependence, cigarettes, uncomplicated: Secondary | ICD-10-CM | POA: Diagnosis not present

## 2022-08-03 DIAGNOSIS — I10 Essential (primary) hypertension: Secondary | ICD-10-CM | POA: Diagnosis not present

## 2022-08-03 LAB — CBC
HCT: 38.3 % — ABNORMAL LOW (ref 39.0–52.0)
Hemoglobin: 13.1 g/dL (ref 13.0–17.0)
MCH: 31.7 pg (ref 26.0–34.0)
MCHC: 34.2 g/dL (ref 30.0–36.0)
MCV: 92.7 fL (ref 80.0–100.0)
Platelets: 192 10*3/uL (ref 150–400)
RBC: 4.13 MIL/uL — ABNORMAL LOW (ref 4.22–5.81)
RDW: 11.9 % (ref 11.5–15.5)
WBC: 11.4 10*3/uL — ABNORMAL HIGH (ref 4.0–10.5)
nRBC: 0 % (ref 0.0–0.2)

## 2022-08-03 LAB — BASIC METABOLIC PANEL
Anion gap: 8 (ref 5–15)
BUN: 19 mg/dL (ref 8–23)
CO2: 23 mmol/L (ref 22–32)
Calcium: 8.7 mg/dL — ABNORMAL LOW (ref 8.9–10.3)
Chloride: 103 mmol/L (ref 98–111)
Creatinine, Ser: 1.07 mg/dL (ref 0.61–1.24)
GFR, Estimated: >60 mL/min (ref >60)
Glucose, Bld: 143 mg/dL — ABNORMAL HIGH (ref 70–99)
Potassium: 3.9 mmol/L (ref 3.5–5.1)
Sodium: 134 mmol/L — ABNORMAL LOW (ref 135–145)

## 2022-08-03 MED ORDER — DOCUSATE SODIUM 100 MG PO CAPS: 100.0000 mg | ORAL_CAPSULE | Freq: Two times a day (BID) | ORAL | 0 refills | Status: DC

## 2022-08-03 MED ORDER — OXYCODONE-ACETAMINOPHEN 5-325 MG PO TABS: 1.0000 | ORAL_TABLET | Freq: Four times a day (QID) | ORAL | 0 refills | Status: DC | PRN

## 2022-08-03 MED ORDER — OXYBUTYNIN CHLORIDE 5 MG PO TABS: 5.0000 mg | ORAL_TABLET | Freq: Three times a day (TID) | ORAL | 0 refills | Status: DC | PRN

## 2022-08-03 NOTE — Progress Notes (Signed)
Foley catheter bag changed to leg bag. This nurse demonstrated how to empty catheter bag and patient verbalized understanding. Patient understands that group home staff will not be able to help with this and patient verbalized that he could do it. This nurse spoke with [name], group home director, and will review discharge paperwork upon her arrival. Patient to be transported to private vehicle via wheelchair.

## 2022-08-03 NOTE — TOC Initial Note (Addendum)
Transition of Care [facility]) - Initial/Assessment Note    Patient Details  Name: Ian Cook MRN: [id number] Date of Birth: Nov 19, 1960  Transition [facility]) CM/SW Contact:    Candie Chroman, LCSW Phone Number: 08/03/2022, 11:18 AM  Clinical Narrative:    Patient is a resident at [facility]. Called director to make her aware that patient will discharge with foley catheter for one week. Director stated they are unable to assist patient with foley so he will have to be able to manage it on his own. Urology is aware.              12:27 pm: RN will provide foley education to patient. [facility] director and legal guardian are aware of potential discharge today.  Expected Discharge Plan: Group Home Barriers to Discharge: Continued Medical Work up   Patient Goals and CMS Choice            Expected Discharge Plan and Services     Post Acute Care Choice: NA Living arrangements for the past 2 months: Group Home                                      Prior Living Arrangements/Services Living arrangements for the past 2 months: [place] Lives with:: Facility Resident Patient language and need for interpreter reviewed:: Yes        Need for Family Participation in Patient Care: Yes (Comment) Care giver support system in place?: Yes (comment)   Criminal Activity/Legal Involvement Pertinent to Current Situation/Hospitalization: No - Comment as needed  Activities of Daily Living Home Assistive Devices/Equipment: None ADL Screening (condition at time of admission) Patient's cognitive ability adequate to safely complete daily activities?: Yes Is the patient deaf or have difficulty hearing?: No Does the patient have difficulty seeing, even when wearing glasses/contacts?: No Does the patient have difficulty concentrating, remembering, or making decisions?: No Patient able to express need for assistance with ADLs?: No Does the patient have difficulty dressing or  bathing?: Yes Independently performs ADLs?: Yes (appropriate for developmental age) Does the patient have difficulty walking or climbing stairs?: No Weakness of Legs: None Weakness of Arms/Hands: None  Permission Sought/Granted Permission sought to share information with : Facility Arts administrator granted to share info w AGENCY: [facility]        Emotional Assessment       Orientation: : Oriented to Self, Oriented to Place, Oriented to  Time, Oriented to Situation Alcohol / Substance Use: Not Applicable Psych Involvement: No (comment)  Admission diagnosis:  Prostate cancer [facility]) [C61] Patient Active Problem List   Diagnosis Date Noted   Prostate cancer ([place]) 08/02/2022   HTN (hypertension) 11/01/2014   Tobacco use disorder 11/01/2014   Schizoaffective disorder, manic type ([place]) 10/30/2014   PCP:  [name], MD Pharmacy:   [facility], [place] [address] Phone: [phone] Fax: [phone]     Social Determinants of Health (SDOH) Social History: SDOH Screenings   Tobacco Use: High Risk (08/03/2022)   SDOH Interventions:     Readmission Risk Interventions     No data to display

## 2022-08-03 NOTE — Care Management Obs Status (Signed)
[facility] NOTIFICATION   Patient Details  Name: Prentis Deperalta MRN: [id number] Date of Birth: Feb 16, 1961   Medicare Observation Status Notification Given:  Yes    Candie Chroman, LCSW 08/03/2022, 12:27 PM

## 2022-08-03 NOTE — Discharge Summary (Signed)
Date of admission: 08/02/2022  Date of discharge: 08/03/2022  Admission diagnosis: Prostate cancer  Discharge diagnosis: Same as above, bilateral inguinal hernia  Secondary diagnoses:  Patient Active Problem List   Diagnosis Date Noted   Prostate cancer ([facility]) 08/02/2022   HTN (hypertension) 11/01/2014   Tobacco use disorder 11/01/2014   Schizoaffective disorder, manic type ([place]) 10/30/2014   History and Physical: For full details, please see admission history and physical. Briefly, Ian Cook is a 62 y.o. year old patient admitted on 08/02/2022 for scheduled robotic assisted laparoscopic radical prostatectomy, bilateral partial nerve sparing, with bilateral pelvic lymph node dissection with Dr. Erlene Quan for management of Gleason 3+4 prostate cancer.   This morning he reports appropriate pain control.  He has not yet passed flatus.  He reports some emesis early this morning but denies nausea at the present time.  Foley catheter in place draining clear, yellow urine.  Diet orders have been advanced.  As of this afternoon, he is tolerating PO, ambulatory, and pain remains well controlled. Nursing has done catheter teaching with him and he understands how to manage his drainage bag.  Physical Exam: Constitutional:  Alert, no acute distress, nontoxic appearing HEENT: [place], AT Cardiovascular: No clubbing, cyanosis, or edema Respiratory: Normal respiratory effort, no increased work of breathing GI: Surgical incisions noted over the anterior abdomen, all clean, dry, and intact with overlying surgical adhesive.  Abdomen is soft with appropriate postoperative tenderness and without rigidity or rebound. Skin: No rashes, bruises or suspicious lesions Neurologic: Grossly intact, no focal deficits, moving all 4 extremities Psychiatric: Normal mood and affect   Hospital Course: Patient tolerated the procedure well.  He was then transferred to the floor after an uneventful PACU stay.  His hospital  course was uncomplicated.  On POD#1 he had met discharge criteria: was eating a regular diet, was up and ambulating independently,  pain was well controlled, catheter was draining well, and was ready for discharge.  Laboratory values:  Recent Labs    08/03/22 0412  WBC 11.4*  HGB 13.1  HCT 38.3*   Recent Labs    08/03/22 0412  NA 134*  K 3.9  CL 103  CO2 23  GLUCOSE 143*  BUN 19  CREATININE 1.07  CALCIUM 8.7*   Results for orders placed or performed during the hospital encounter of 07/28/22  Urine Culture     Status: None   Collection Time: 07/28/22 10:56 AM   Specimen: Urine, Clean Catch  Result Value Ref Range Status   Specimen Description   Final    URINE, CLEAN CATCH Performed at [facility], [address], [place], [place] [address]    Special Requests   Final    NONE Performed at [facility], [address], [place], [place] [address]    Culture   Final    NO GROWTH Performed at [facility], [place] [address] [place], [place] [address]    Report Status 07/29/2022 FINAL  Final   Disposition: Home  Discharge instruction: The patient was instructed to be ambulatory but told to refrain from heavy lifting, strenuous activity, or driving.  Catheter care instructions provided by nursing.  Discharge medications:  Allergies as of 08/03/2022   No Known Allergies      Medication List     STOP taking these medications    tamsulosin 0.4 MG Caps capsule Commonly known as: FLOMAX       TAKE these medications    amLODipine-benazepril 10-20 MG capsule  Commonly known as: LOTREL Take 1 capsule by mouth every morning.   atorvastatin 10 MG tablet Commonly known as: LIPITOR Take 10 mg by mouth at bedtime.   benztropine 0.5 MG tablet Commonly known as: COGENTIN Take 1 tablet (0.5 mg total) by mouth daily. What changed: when to take this   brimonidine 0.2 % ophthalmic solution Commonly known as: ALPHAGAN Place 1 drop into the  right eye daily.   docusate sodium 100 MG capsule Commonly known as: COLACE Take 1 capsule (100 mg total) by mouth 2 (two) times daily.   dorzolamide-timolol 2-0.5 % ophthalmic solution Commonly known as: COSOPT Place 1 drop into the right eye at bedtime.   latanoprost 0.005 % ophthalmic solution Commonly known as: XALATAN Place 1 drop into the right eye at bedtime.   methimazole 5 MG tablet Commonly known as: TAPAZOLE Take 5 mg by mouth every morning.   OLANZapine 5 MG tablet Commonly known as: ZYPREXA Take 1 tablet (5 mg total) by mouth at bedtime.   oxybutynin 5 MG tablet Commonly known as: DITROPAN Take 1 tablet (5 mg total) by mouth every 8 (eight) hours as needed for bladder spasms.   oxyCODONE-acetaminophen 5-325 MG tablet Commonly known as: PERCOCET/ROXICET Take 1-2 tablets by mouth every 6 (six) hours as needed for moderate pain.   traZODone 100 MG tablet Commonly known as: DESYREL Take 1 tablet (100 mg total) by mouth at bedtime.        Followup:   Follow-up Information     Debroah Loop, [place]. Go on 08/09/2022.   Specialty: Urology Why: For catheter removal Contact information: [place] [address] [phone]

## 2022-08-03 NOTE — Progress Notes (Signed)
Peri and foley care completed along with CHG. Patient instructed on keeping foley catheter cleaned and verbalized understanding of how to clean. Ambulated in hall approximately 271f, steady gait, no c/o pain or SOB. Patient in chair with regular diet tray. Call bell within reach.

## 2022-08-03 NOTE — NC FL2 (Signed)
[place] LEVEL OF CARE FORM     IDENTIFICATION  Patient Name: Ian Cook Birthdate: 08/06/1960 Sex: male Admission Date (Current Location): 08/02/2022  [place] and [place] Number:  Engineering geologist and Address:  [facility], [address] [place], [place] [address]      Provider Number: [id number]  Attending Physician Name and Address:  [name], MD  Relative Name and Phone Number:       Current Level of Care: Hospital Recommended Level of Care: [place] Prior Approval Number:    Date Approved/Denied:   PASRR Number:    Discharge Plan: Other (Comment) ([place])    Current Diagnoses: Patient Active Problem List   Diagnosis Date Noted   Prostate cancer ([place]) 08/02/2022   HTN (hypertension) 11/01/2014   Tobacco use disorder 11/01/2014   Schizoaffective disorder, manic type ([place]) 10/30/2014    Orientation RESPIRATION BLADDER Height & Weight     Self, Time, Situation, Place  Normal Indwelling catheter, Continent (Will have foley catheter until urology appointment on 3/4. Patient has been given education to manage on his own.) Weight: 152 lb (68.9 kg) Height:  '6\' 4"'$  (193 cm)  BEHAVIORAL SYMPTOMS/MOOD NEUROLOGICAL BOWEL NUTRITION STATUS   (None)  (None) Continent Diet (Regular)  AMBULATORY STATUS COMMUNICATION OF NEEDS Skin     Verbally Surgical wounds (Incision on abdomen: Dermabond. Incision on perineum: No dressing.)                       Personal Care Assistance Level of Assistance              Functional Limitations Info  Sight, Hearing, Speech Sight Info: Adequate Hearing Info: Adequate Speech Info: Adequate    SPECIAL CARE FACTORS FREQUENCY                       Contractures Contractures Info: Not present    Additional Factors Info  Code Status, Allergies Code Status Info: Full code Allergies Info: NKDA           Current Medications (08/03/2022):  This is the  current hospital active medication list Current Facility-Administered Medications  Medication Dose Route Frequency Provider Last Rate Last Admin   0.9 %  sodium chloride infusion   Intravenous Continuous [name], MD 125 mL/hr at 08/03/22 0143 New Bag at 08/03/22 0143   acetaminophen (TYLENOL) tablet 650 mg  650 mg Oral Q4H PRN [name], MD   650 mg at 08/03/22 1431   amLODipine (NORVASC) tablet 10 mg  10 mg Oral Daily [name], MD   10 mg at 08/03/22 [id number]   And   benazepril (LOTENSIN) tablet 20 mg  20 mg Oral Daily [name], MD   20 mg at 08/03/22 [id number]   atorvastatin (LIPITOR) tablet 10 mg  10 mg Oral Daily [name], MD   10 mg at 08/03/22 [id number]   benztropine (COGENTIN) tablet 0.5 mg  0.5 mg Oral Lodema Pilot, Caryl Pina, MD   0.5 mg at 08/03/22 0544   brimonidine (ALPHAGAN) 0.2 % ophthalmic solution 1 drop  1 drop Right Eye Daily [name], MD   1 drop at 08/03/22 [id number]   Chlorhexidine Gluconate Cloth 2 % PADS 6 each  6 each Topical Q0600 [name], MD   6 each at 08/03/22 0931   diphenhydrAMINE (BENADRYL) injection 12.5 mg  12.5 mg Intravenous Q6H PRN [name], MD  Or   diphenhydrAMINE (BENADRYL) 12.5 MG/5ML elixir 12.5 mg  12.5 mg Oral Q6H PRN [name], MD       docusate sodium (COLACE) capsule 100 mg  100 mg Oral BID [name], MD   100 mg at 08/03/22 0927   dorzolamide (TRUSOPT) 2 % ophthalmic solution 1 drop  1 drop Right Eye BID Darrick Penna, RPH   1 drop at 08/03/22 [id number]   heparin injection 5,000 Units  5,000 Units Subcutaneous Q8H [name], MD   5,000 Units at 08/03/22 0544   latanoprost (XALATAN) 0.005 % ophthalmic solution 1 drop  1 drop Right Eye Riki Sheer, MD   1 drop at 08/02/22 2151   methimazole (TAPAZOLE) tablet 5 mg  5 mg Oral Maryclare Labrador, MD   5 mg at 08/03/22 0544   morphine (PF) 2 MG/ML injection 2-4 mg  2-4 mg Intravenous Q2H PRN [name], MD       OLANZapine  [facility]) tablet 5 mg  5 mg Oral QHS [name], MD   5 mg at 08/02/22 2149   ondansetron [facility]) injection 4 mg  4 mg Intravenous Q4H PRN [name], MD   4 mg at 08/03/22 0420   oxybutynin (DITROPAN) tablet 5 mg  5 mg Oral Q8H PRN [name], MD       oxyCODONE-acetaminophen (PERCOCET/ROXICET) 5-325 MG per tablet 1-2 tablet  1-2 tablet Oral Q4H PRN [name], MD       timolol (TIMOPTIC) 0.5 % ophthalmic solution 1 drop  1 drop Right Eye BID Darrick Penna, RPH   1 drop at 08/03/22 [id number]   zolpidem (AMBIEN) tablet 5 mg  5 mg Oral QHS PRN,MR X 1 [name], MD   5 mg at 08/02/22 2149     Discharge Medications: STOP taking these medications     tamsulosin 0.4 MG Caps capsule Commonly known as: FLOMAX           TAKE these medications     amLODipine-benazepril 10-20 MG capsule Commonly known as: LOTREL Take 1 capsule by mouth every morning.    atorvastatin 10 MG tablet Commonly known as: LIPITOR Take 10 mg by mouth at bedtime.    benztropine 0.5 MG tablet Commonly known as: COGENTIN Take 1 tablet (0.5 mg total) by mouth daily. What changed: when to take this    brimonidine 0.2 % ophthalmic solution Commonly known as: ALPHAGAN Place 1 drop into the right eye daily.    docusate sodium 100 MG capsule Commonly known as: COLACE Take 1 capsule (100 mg total) by mouth 2 (two) times daily.    dorzolamide-timolol 2-0.5 % ophthalmic solution Commonly known as: COSOPT Place 1 drop into the right eye at bedtime.    latanoprost 0.005 % ophthalmic solution Commonly known as: XALATAN Place 1 drop into the right eye at bedtime.    methimazole 5 MG tablet Commonly known as: TAPAZOLE Take 5 mg by mouth every morning.    OLANZapine 5 MG tablet Commonly known as: ZYPREXA Take 1 tablet (5 mg total) by mouth at bedtime.    oxybutynin 5 MG tablet Commonly known as: DITROPAN Take 1 tablet (5 mg total) by mouth every 8 (eight) hours as needed for bladder  spasms.    oxyCODONE-acetaminophen 5-325 MG tablet Commonly known as: PERCOCET/ROXICET Take 1-2 tablets by mouth every 6 (six) hours as needed for moderate pain.    traZODone 100 MG tablet Commonly known as: DESYREL Take 1 tablet (100 mg total)  by mouth at bedtime.    Relevant Imaging Results:  Relevant Lab Results:   Additional Information SS#: [id number]  Candie Chroman, LCSW

## 2022-08-03 NOTE — TOC Transition Note (Signed)
Transition of Care [facility]) - CM/SW Discharge Note   Patient Details  Name: [name] MRN: [id number] Date of Birth: 1961/04/06  Transition of Care [facility]) CM/SW Contact:  Candie Chroman, LCSW Phone Number: 08/03/2022, 3:08 PM   Clinical Narrative:  Patient has orders to discharge back to [facility] today. Faxed discharge paperwork to facility. RN will call report to director at [phone]. She will pick him up in about 45 minutes. Legal guardian is aware. No further concerns. CSW signing off.   Final next level of care: Group Home Barriers to Discharge: Barriers Resolved   Patient Goals and CMS Choice      Discharge Placement                  Patient to be transferred to facility by: Group home staff Name of family member notified: Earl Lagos - DSS legal guardian Patient and family notified of of transfer: 08/03/22  Discharge Plan and Services Additional resources added to the After Visit Summary for       Post Acute Care Choice: NA                               Social Determinants of Health (SDOH) Interventions SDOH Screenings   Tobacco Use: High Risk (08/03/2022)     Readmission Risk Interventions     No data to display

## 2022-08-05 ENCOUNTER — Encounter: Payer: Self-pay | Admitting: Urology

## 2022-08-05 LAB — SURGICAL PATHOLOGY

## 2022-08-09 ENCOUNTER — Other Ambulatory Visit: Payer: Self-pay | Admitting: Internal Medicine

## 2022-08-09 ENCOUNTER — Encounter: Payer: Self-pay | Admitting: Physician Assistant

## 2022-08-09 ENCOUNTER — Other Ambulatory Visit
Admission: RE | Admit: 2022-08-09 | Discharge: 2022-08-09 | Disposition: A | Discharge status: Home / Self Care | Payer: Medicare HMO | Source: Ambulatory Visit | Attending: Physician Assistant | Admitting: Physician Assistant

## 2022-08-09 ENCOUNTER — Ambulatory Visit (INDEPENDENT_AMBULATORY_CARE_PROVIDER_SITE_OTHER): Payer: Medicare HMO | Admitting: Physician Assistant

## 2022-08-09 VITALS — BP 112/75 | HR 61 | Ht 76.0 in | Wt 157.0 lb

## 2022-08-09 DIAGNOSIS — C61 Malignant neoplasm of prostate: Secondary | ICD-10-CM | POA: Diagnosis not present

## 2022-08-09 LAB — BASIC METABOLIC PANEL
Anion gap: 6 (ref 5–15)
BUN: 22 mg/dL (ref 8–23)
CO2: 27 mmol/L (ref 22–32)
Calcium: 9 mg/dL (ref 8.9–10.3)
Chloride: 104 mmol/L (ref 98–111)
Creatinine, Ser: 1.07 mg/dL (ref 0.61–1.24)
GFR, Estimated: >60 mL/min (ref >60)
Glucose, Bld: 91 mg/dL (ref 70–99)
Potassium: 4.2 mmol/L (ref 3.5–5.1)
Sodium: 137 mmol/L (ref 135–145)

## 2022-08-09 LAB — CBC
HCT: 39.9 % (ref 39.0–52.0)
Hemoglobin: 13.1 g/dL (ref 13.0–17.0)
MCH: 31.6 pg (ref 26.0–34.0)
MCHC: 32.8 g/dL (ref 30.0–36.0)
MCV: 96.1 fL (ref 80.0–100.0)
Platelets: 255 10*3/uL (ref 150–400)
RBC: 4.15 MIL/uL — ABNORMAL LOW (ref 4.22–5.81)
RDW: 12.2 % (ref 11.5–15.5)
WBC: 6.2 10*3/uL (ref 4.0–10.5)
nRBC: 0 % (ref 0.0–0.2)

## 2022-08-09 LAB — BLADDER SCAN AMB NON-IMAGING: Scan Result: 118

## 2022-08-09 MED ORDER — SULFAMETHOXAZOLE-TRIMETHOPRIM 800-160 MG PO TABS: 1.0000 | ORAL_TABLET | Freq: Once | ORAL | Status: DC

## 2022-08-09 MED ORDER — SULFAMETHOXAZOLE-TRIMETHOPRIM 800-160 MG PO TABS: 1.0000 | ORAL_TABLET | Freq: Two times a day (BID) | ORAL | 0 refills | Status: AC

## 2022-08-09 NOTE — Progress Notes (Signed)
Patient presented to clinic today for postop Foley removal s/p RALP with Dr. Erlene Quan 7 days ago.  He reports he was having leakage around his urinary catheter, so he removed it himself at home on POD 3 or 4.  Unclear if he deflated the balloon prior to removal; he states he removed it with pliers.  Abdomen is soft, nondistended, and nontender.  Catheter appears to have been removed in its entirety with no components projecting from the urethral meatus; there is no bleeding emanating from the urethra.  He reports he is voiding with minimal urinary leakage, no gross hematuria, no dysuria, and no fevers.  PVR 145m.  He was sent to the hospital lab for stat CBC and BMP. White count, hemoglobin, and creatinine were stable.  Results for orders placed or performed in visit on 08/09/22  BLADDER SCAN AMB NON-IMAGING  Result Value Ref Range   Scan Result 118 ml     Will prescribe Bactrim DS BID x3 days for UTI prevention and follow closely with repeat abdominal exam in 3 days. If abnormal, will replace Foley and obtain CT cystogram for further evaluation. We discussed return precautions including abdominal pain and fevers. He expressed understanding.

## 2022-08-12 ENCOUNTER — Ambulatory Visit: Payer: Medicare HMO | Admitting: Physician Assistant

## 2022-09-01 DIAGNOSIS — E042 Nontoxic multinodular goiter: Secondary | ICD-10-CM | POA: Diagnosis not present

## 2022-09-01 DIAGNOSIS — E0590 Thyrotoxicosis, unspecified without thyrotoxic crisis or storm: Secondary | ICD-10-CM | POA: Diagnosis not present

## 2022-09-06 ENCOUNTER — Other Ambulatory Visit: Payer: Self-pay

## 2022-09-06 ENCOUNTER — Ambulatory Visit (LOCAL_COMMUNITY_HEALTH_CENTER): Payer: Self-pay

## 2022-09-06 DIAGNOSIS — Z111 Encounter for screening for respiratory tuberculosis: Secondary | ICD-10-CM

## 2022-09-06 NOTE — Progress Notes (Signed)
Patient in clinic today for PPD placement for [facility]. Patient reports he has been living there for 1 yr and 10 months. PPD placed in left are. Patient instructed not to scratch, use lotions or place band aid on site. Patient reminded to return on Thursday for reading.  Servando Salina, RN

## 2022-09-09 ENCOUNTER — Ambulatory Visit (LOCAL_COMMUNITY_HEALTH_CENTER): Payer: Self-pay

## 2022-09-09 DIAGNOSIS — Z111 Encounter for screening for respiratory tuberculosis: Secondary | ICD-10-CM

## 2022-09-09 LAB — TB SKIN TEST
Induration: 0 mm
TB Skin Test: NEGATIVE

## 2022-09-10 ENCOUNTER — Other Ambulatory Visit: Payer: Self-pay | Admitting: *Deleted

## 2022-09-10 ENCOUNTER — Telehealth: Payer: Self-pay

## 2022-09-10 ENCOUNTER — Other Ambulatory Visit: Payer: Medicare HMO

## 2022-09-10 DIAGNOSIS — R972 Elevated prostate specific antigen [PSA]: Secondary | ICD-10-CM

## 2022-09-10 NOTE — Telephone Encounter (Signed)
For consent for procedure or treatment contact Director of DSS Anselmo Pickler  (office [phone], cell# [phone]) per FYI notes. I called and left her a message stating that patient missed his lab appointment today and that he has f/u scheduled on 09/15/22 and wanted to have her informed before calling his phone number, per notes patient resides at [facility]

## 2022-09-10 NOTE — Telephone Encounter (Signed)
(  rescheduled by Shanda Bumps at [facility]) for 09/13/22

## 2022-09-13 ENCOUNTER — Other Ambulatory Visit: Payer: Medicare HMO

## 2022-09-13 DIAGNOSIS — R972 Elevated prostate specific antigen [PSA]: Secondary | ICD-10-CM | POA: Diagnosis not present

## 2022-09-14 LAB — PSA: Prostate Specific Ag, Serum: <0.1 ng/mL (ref 0.0–4.0)

## 2022-09-15 ENCOUNTER — Ambulatory Visit (INDEPENDENT_AMBULATORY_CARE_PROVIDER_SITE_OTHER): Payer: Medicare HMO | Admitting: Urology

## 2022-09-15 VITALS — BP 123/73 | HR 90 | Ht 76.0 in | Wt 157.0 lb

## 2022-09-15 DIAGNOSIS — N393 Stress incontinence (female) (male): Secondary | ICD-10-CM

## 2022-09-15 DIAGNOSIS — Z8546 Personal history of malignant neoplasm of prostate: Secondary | ICD-10-CM

## 2022-09-15 DIAGNOSIS — R972 Elevated prostate specific antigen [PSA]: Secondary | ICD-10-CM

## 2022-09-15 MED ORDER — AMBULATORY NON FORMULARY MEDICATION: 12 refills | Status: AC

## 2022-09-15 NOTE — Progress Notes (Signed)
Ian Cook, Ian Cook,acting as a scribe for Ian ScotlandAshley Lizzy Hamre, MD.,have documented all relevant documentation on the behalf of Ian Scotlandshley Kyannah Climer, MD,as directed by  Ian ScotlandAshley Mikelle Myrick, MD while in the presence of Ian ScotlandAshley Ian Elizondo, MD.  09/15/2022 2:59 PM   Oda Coganonnie Posadas 11/27/1960 [id number]  Referring provider: Sherron Mondayejan-Sie, S Ahmed, MD [address] [place] [id number]  Chief Complaint  Patient presents with   Follow-up    HPI: 62 year-old male with a personal history of prostate cancer.   He underwent a radical prostatectomy on 08/02/2022 with bilateral pelvic lymph node dissection. Surgical pathology was consistent with Gleason 3+4. He did have evidence of non-focal extraprostatic extension, right seminal vesicle invasion, but negative margins and lymph nodes, classified as [id number].   His post operative course was complicated by self removal of his urinary catheter on post-op day 3 or 4 in his group home and it is unclear if the Foley balloon was deflated. He was seen in clinic several days later. At that time, all of his labs were normal. He was not retaining urine was we elected not to replace the catheter.   His PSA on 09/13/2022 was undetectable.   Today, he reports urinary incontinence but notes improvement.  He lives in a group home.  He is accompanied by group home member today.  PMH: Past Medical History:  Diagnosis Date   Anxiety    Glaucoma    HTN (hypertension) 11/01/2014   Hypertension    Prostate cancer (HCC)    Schizo-affective schizophrenia (HCC)    Schizoaffective disorder, manic type (HCC) 10/30/2014   Tobacco use disorder 11/01/2014    Surgical History: Past Surgical History:  Procedure Laterality Date   PELVIC LYMPH NODE DISSECTION Bilateral 08/02/2022   Procedure: PELVIC LYMPH NODE DISSECTION;  Surgeon: Ian ScotlandBrandon, Jakota Manthei, MD;  Location: [facility] ORS;  Service: Urology;  Laterality: Bilateral;   ROBOT ASSISTED LAPAROSCOPIC RADICAL PROSTATECTOMY N/A 08/02/2022    Procedure: XI ROBOTIC ASSISTED LAPAROSCOPIC RADICAL PROSTATECTOMY;  Surgeon: Ian ScotlandBrandon, Mark Hassey, MD;  Location: [facility] ORS;  Service: Urology;  Laterality: N/A;   TONSILLECTOMY      Home Medications:  Allergies as of 09/15/2022   No Known Allergies      Medication List        Accurate as of September 15, 2022  2:59 PM. If you have any questions, ask your nurse or doctor.          STOP taking these medications    benztropine 0.5 MG tablet Commonly known as: COGENTIN   dorzolamide-timolol 2-0.5 % ophthalmic solution Commonly known as: COSOPT       TAKE these medications    AMBULATORY NON FORMULARY MEDICATION ADULT PULL UPS-due to urinary leakage   amLODipine-benazepril 10-20 MG capsule Commonly known as: LOTREL Take 1 capsule by mouth every morning.   atorvastatin 10 MG tablet Commonly known as: LIPITOR TAKE 1 TABLET BY MOUTH EVERY EVENING   brimonidine 0.2 % ophthalmic solution Commonly known as: ALPHAGAN Place 1 drop into the right eye daily.   docusate sodium 100 MG capsule Commonly known as: COLACE Take 1 capsule (100 mg total) by mouth 2 (two) times daily.   latanoprost 0.005 % ophthalmic solution Commonly known as: XALATAN Place 1 drop into the right eye at bedtime.   methimazole 5 MG tablet Commonly known as: TAPAZOLE Take 5 mg by mouth every morning.   OLANZapine 5 MG tablet Commonly known as: ZYPREXA Take 1 tablet (5 mg total) by mouth at bedtime.   oxybutynin  5 MG tablet Commonly known as: DITROPAN Take 1 tablet (5 mg total) by mouth every 8 (eight) hours as needed for bladder spasms.   oxyCODONE-acetaminophen 5-325 MG tablet Commonly known as: PERCOCET/ROXICET Take 1-2 tablets by mouth every 6 (six) hours as needed for moderate pain.   traZODone 100 MG tablet Commonly known as: DESYREL Take 1 tablet (100 mg total) by mouth at bedtime.        Family History: Family History  Problem Relation Age of Onset   Aneurysm Mother    Depression  Mother    CAD Father    Kidney cancer Neg Hx    Prostate cancer Neg Hx     Social History:  reports that he has been smoking cigarettes. He has a 29.25 pack-year smoking history. He has been exposed to tobacco smoke. He has never used smokeless tobacco. He reports that he does not drink alcohol and does not use drugs.   Physical Exam: BP 123/73   Pulse 90   Ht 6\' 4"  (1.93 m)   Wt 157 lb (71.2 kg)   BMI 19.11 kg/m   Constitutional:  Alert and oriented, No acute distress. HEENT: [place] AT, moist mucus membranes.  Trachea midline, no masses. Abdomen: Incisions are well healed Neurologic: Grossly intact, no focal deficits, moving all 4 extremities. Psychiatric: Normal mood and affect.   Assessment & Plan:    1. Prostate cancer - Status post radical prostatectomy on 08/02/2022 - Well healed surgical incisions - Continue to monitor recovery - Discontinue Flomax -PSA is undetectable which is reassuring -Surgical pathology reviewed, unfavorable with seminal vesicle involvement -Recheck PSA in 6 months  2. Stress incontinence - Noted improvement in urinary incontinence.  - Prescribe pull-ups and recommend pelvic floor exercises to strengthen pelvic muscles and potentially expedite recovery of urinary control.  Return in about 6 months (around 03/17/2023) for repeat PSA.    I have reviewed the above documentation for accuracy and completeness, and I agree with the above.   Ian Scotland, MD   [facility] [address] [place], [place] [address] [phone]

## 2022-10-13 ENCOUNTER — Encounter (HOSPITAL_COMMUNITY): Payer: Self-pay | Admitting: Psychiatry

## 2022-10-13 ENCOUNTER — Telehealth (HOSPITAL_BASED_OUTPATIENT_CLINIC_OR_DEPARTMENT_OTHER): Payer: Medicare HMO | Admitting: Psychiatry

## 2022-10-13 DIAGNOSIS — R4183 Borderline intellectual functioning: Secondary | ICD-10-CM

## 2022-10-13 DIAGNOSIS — F250 Schizoaffective disorder, bipolar type: Secondary | ICD-10-CM

## 2022-10-13 MED ORDER — TRAZODONE HCL 100 MG PO TABS: 100.0000 mg | ORAL_TABLET | Freq: Every day | ORAL | 1 refills | Status: DC

## 2022-10-13 MED ORDER — OLANZAPINE 5 MG PO TABS: 5.0000 mg | ORAL_TABLET | Freq: Every day | ORAL | 1 refills | Status: DC

## 2022-10-13 NOTE — Progress Notes (Signed)
[facility] MD Virtual Progress Note   Patient Location: Group Home Provider Location: Home Office  I connect with patient by telephone and verified that I am speaking with correct person by using two identifiers. I discussed the limitations of evaluation and management by telemedicine and the availability of in person appointments. I also discussed with the patient that there may be a patient responsible charge related to this service. The patient expressed understanding and agreed to proceed.  Ian Cook [id number] 62 y.o.  10/13/2022 10:19 AM  History of Present Illness:  Patient is evaluated by phone session.  He is at group home and he is a poor historian.  Most of the information was obtained from the staff name "Alvira Philips" who is her caretaker of the facility.  Patient recently had a procedure for prostate cancer.  Patient is not aware about the details but reported he was not doing very well and he was in the hospital for a checkup.  As per staff he still talks to himself and sometime restless and impulsive but not a management issue.  He sleeps okay.  He sleeps 7 to 8 hours every night.  As per staff his eating okay and denies any anger or agitation.  Patient denies any hallucination or paranoia but also have poverty of thought content.  His speech is slow and he does not answer spontaneous and take some time to respond.  He is no longer taking Cogentin which was discontinued upon discharge from hospital after the procedure.  As per staff he is okay with olanzapine and trazodone and has no tremor or shakes or any EPS.  We have requested appointment in person but due to logistic and transportation issue patient was not able to come for in person visit.  Past Psychiatric History: H/O psychosis, mania and running away from the group home. Multiple inpatient and last inpatient in May 2016. Given Haldol as needed for agitation in group home. H/O taking Navane and Latuda.  No h/o  suicidal attempt and self abusive behavior.    Outpatient Encounter Medications as of 10/13/2022  Medication Sig   AMBULATORY NON FORMULARY MEDICATION ADULT PULL UPS-due to urinary leakage   amLODipine-benazepril (LOTREL) 10-20 MG capsule Take 1 capsule by mouth every morning.   atorvastatin (LIPITOR) 10 MG tablet TAKE 1 TABLET BY MOUTH EVERY EVENING   brimonidine (ALPHAGAN) 0.2 % ophthalmic solution Place 1 drop into the right eye daily.   docusate sodium (COLACE) 100 MG capsule Take 1 capsule (100 mg total) by mouth 2 (two) times daily.   latanoprost (XALATAN) 0.005 % ophthalmic solution Place 1 drop into the right eye at bedtime.   methimazole (TAPAZOLE) 5 MG tablet Take 5 mg by mouth every morning.   OLANZapine (ZYPREXA) 5 MG tablet Take 1 tablet (5 mg total) by mouth at bedtime.   oxybutynin (DITROPAN) 5 MG tablet Take 1 tablet (5 mg total) by mouth every 8 (eight) hours as needed for bladder spasms.   oxyCODONE-acetaminophen (PERCOCET/ROXICET) 5-325 MG tablet Take 1-2 tablets by mouth every 6 (six) hours as needed for moderate pain.   traZODone (DESYREL) 100 MG tablet Take 1 tablet (100 mg total) by mouth at bedtime.   No facility-administered encounter medications on file as of 10/13/2022.    Recent Results (from the past 2160 hour(s))  CBC     Status: None   Collection Time: 07/28/22 10:56 AM  Result Value Ref Range   WBC 5.1 4.0 - 10.5 K/uL   RBC  4.35 4.22 - 5.81 MIL/uL   Hemoglobin 13.5 13.0 - 17.0 g/dL   HCT 16.1 09.6 - 04.5 %   MCV 95.4 80.0 - 100.0 fL   MCH 31.0 26.0 - 34.0 pg   MCHC 32.5 30.0 - 36.0 g/dL   RDW 40.9 81.1 - 91.4 %   Platelets 220 150 - 400 K/uL   nRBC 0.0 0.0 - 0.2 %    Comment: Performed at [facility], [address], [place], [place] [address]  Basic metabolic panel     Status: Abnormal   Collection Time: 07/28/22 10:56 AM  Result Value Ref Range   Sodium 137 135 - 145 mmol/L   Potassium 3.6 3.5 - 5.1 mmol/L   Chloride 106 98 - 111 mmol/L    CO2 27 22 - 32 mmol/L   Glucose, Bld 85 70 - 99 mg/dL    Comment: Glucose reference range applies only to samples taken after fasting for at least 8 hours.   BUN 17 8 - 23 mg/dL   Creatinine, Ser 6.21 0.61 - 1.24 mg/dL   Calcium 9.1 8.9 - 30.8 mg/dL   GFR, Estimated >65 >78 mL/min    Comment: (NOTE) Calculated using the CKD-EPI Creatinine Equation (2021)    Anion gap 4 (L) 5 - 15    Comment: Performed at [facility], [address] [place], [place] [address]  Protime-INR     Status: None   Collection Time: 07/28/22 10:56 AM  Result Value Ref Range   Prothrombin Time 14.7 11.4 - 15.2 seconds   INR 1.2 0.8 - 1.2    Comment: (NOTE) INR goal varies based on device and disease states. Performed at [facility], [address] [place], [place] [address]   Urinalysis, Complete w Microscopic -Urine, Clean Catch     Status: Abnormal   Collection Time: 07/28/22 10:56 AM  Result Value Ref Range   Color, Urine YELLOW (A) YELLOW   APPearance CLEAR (A) CLEAR   Specific Gravity, Urine 1.016 1.005 - 1.030   pH 5.0 5.0 - 8.0   Glucose, UA NEGATIVE NEGATIVE mg/dL   Hgb urine dipstick SMALL (A) NEGATIVE   Bilirubin Urine NEGATIVE NEGATIVE   Ketones, ur NEGATIVE NEGATIVE mg/dL   Protein, ur NEGATIVE NEGATIVE mg/dL   Nitrite NEGATIVE NEGATIVE   Leukocytes,Ua NEGATIVE NEGATIVE   RBC / HPF 0-5 0 - 5 RBC/hpf   WBC, UA 0-5 0 - 5 WBC/hpf   Bacteria, UA NONE SEEN NONE SEEN   Squamous Epithelial / HPF 0-5 0 - 5 /HPF   Mucus PRESENT     Comment: Performed at [facility], [address], [place], [place] [address]  Urine Culture     Status: None   Collection Time: 07/28/22 10:56 AM   Specimen: Urine, Clean Catch  Result Value Ref Range   Specimen Description      URINE, CLEAN CATCH Performed at [facility], [address] [place], [place] [address]    Special Requests      NONE Performed at [facility], [address],  [place], [place] [address]    Culture      NO GROWTH Performed at [facility], [address], [place], [place] [address]    Report Status 07/29/2022 FINAL   Type and screen     Status: None   Collection Time: 07/28/22 10:56 AM  Result Value Ref Range   ABO/RH(D) A POS    Antibody Screen NEG  Sample Expiration 08/11/2022,2359    Extend sample reason      NO TRANSFUSIONS OR PREGNANCY IN THE PAST 3 MONTHS Performed at [facility], [address] [place], [place] [address]   ABO/Rh     Status: None   Collection Time: 08/02/22 10:27 AM  Result Value Ref Range   ABO/RH(D)      A POS Performed at [facility], [address] [place], [place] [address]   Surgical pathology     Status: None   Collection Time: 08/02/22 12:25 PM  Result Value Ref Range   SURGICAL PATHOLOGY      SURGICAL PATHOLOGY CASE: [id number] PATIENT: Oda Cogan Surgical Pathology Report     Specimen Submitted: A. Periprostatic fat pad B. Lymph node, right pelvic C. Lymph node, left pelvic D. Prostate  Clinical History: Prostate cancer    DIAGNOSIS: A. PERIPROSTATIC FAT PAD; EXCISION: - BENIGN FIBROADIPOSE TISSUE. - NEGATIVE FOR MALIGNANCY.  B. LYMPH NODES, RIGHT PELVIC; EXCISION: - TWO LYMPH NODES, NEGATIVE FOR MALIGNANCY (0/2).  C. LYMPH NODES, LEFT PELVIC; EXCISION: - THREE LYMPH NODES, NEGATIVE FOR MALIGNANCY (0/3).  D. PROSTATE GLAND; RADICAL PROSTATECTOMY: - ACINAR ADENOCARCINOMA, GLEASON SCORE 3+4 = 7 (GRADE GROUP 2). - SEE CANCER SUMMARY.  Comment: Multiple additional deeper recut levels were examined for selected tissue blocks.  CANCER CASE SUMMARY: PROSTATE GLAND Standard(s): AJCC-UICC 8  SPECIMEN Procedure: Radical prostatectomy  TUMOR Histologic Type: Acinar adenocarcinoma, conventional Histologic Grade:      Grade: Grade gro up 2 (Gleason score 3+4 = 7) Percentage of pattern 4: 11-20% Intraductal Carcinoma (IDC): Not  identified Cribriform Glands (applicable to Gleason score 7 or 8 cancer only): Not identified Treatment Effect: No known presurgical therapy  TUMOR QUANTITATION: Tumor Quantitation: 6-10% Extraprostatic Extension (EPE): Present, nonfocal Urinary Bladder Neck Invasion: Not identified Seminal Vesicle Invasion: Present, right Lymphatic and/or Vascular Invasion: Not identified  MARGINS Margin Status: All margins negative for invasive carcinoma  REGIONAL LYMPH NODES Regional Lymph Node Status: Regional lymph nodes present      All regional lymph nodes negative for tumor      Number of Lymph Nodes Examined: 5  DISTANT METASTASIS Distant Site(s) Involved, if applicable: Not applicable  PATHOLOGIC STAGE CLASSIFICATION (pTNM, AJCC 8th Edition):    Modified Classification: Not applicable pT3b    T Suffix: Not applicable pN0 pM - Not applicable  (v4.3.0.0)  IHC slides we re prepared by [facility], [place], [place]. All controls stained appropriately.  This test was developed and its performance characteristics determined by [facility]. It has not been cleared or approved by the [place] Food and Drug Administration. The FDA does not require this test to go through premarket FDA review. This test is used for clinical purposes. It should not be regarded as investigational or for research. This laboratory is certified under the Clinical Laboratory Improvement Amendments (CLIA) as qualified to perform high complexity clinical laboratory testing.  GROSS DESCRIPTION: A. Labeled: Periprostatic fat pad Received: Fresh Collection time: 12:25 PM on 08/02/2022 Placed into formalin time: 3:15 PM on 08/02/2022 Tissue fragment(s): 1 Size: 2.1 x 1.7 x 1 cm Description: Received is a fragment of grossly unremarkable yellow lobulated adipose tissue.  The fragment is palpated and no distinct masses, lesions, or lymph node candidat es are grossly identified. Entirely  submitted in cassettes 1-2.  B. Labeled: Right pelvic lymph node Received: Fresh Collection time: 1:05 PM on 08/02/2022 Placed into formalin time: 3:15 PM on 08/02/2022 Tissue fragment(s): 1  Size: 3.3 x 1.9 x 1.3 cm Description: Received is a fragment of yellow lobulated adipose tissue. There are 2 embedded lymph node candidates, ranging from 1.8 to 2.2 cm in greatest dimension. The lymph node candidates are entirely submitted in cassettes 1-4 with 1 serially sectioned lymph node candidate in cassettes 1-2 and 1 serially sectioned lymph node candidate in cassettes 3-4.  C. Labeled: Left pelvic lymph node Received: Fresh Collection time: 1:11 PM on 08/02/2022 Placed into formalin time: 3:15 PM on 08/02/2022 Tissue fragment(s): 1 Size: 5 x 1.5 x 1.3 cm Description: Received is a fragment of adipose tissue with 3 embedded lymph node candidates ranging from 1.2 to 3.8 cm in greatest dimension. The lymph node candidates are entirely su bmitted in cassettes 1-5 with 2 bisected and differentially inked lymph node candidates in cassette 1 and 1 lymph node candidate, serially sectioned, in cassettes 2-5.  D. Labeled: Prostate Received: Fresh Collection time: 1:44 PM on 08/02/2022 Placed into formalin time: 3:15 PM on 08/02/2022 Type of procedure: Radical prostatectomy Weight of specimen: 39 grams Size of specimen: 5.2 (medial to lateral) x 3.9 (apex to base) x 3.6 (anterior to posterior) cm Orientation / inking: Right - blue Left - black Apex - yellow Bladder neck - orange Seminal vesicles: The right seminal vesicle is 2.5 x 1.4 x 0.6 cm and the left seminal vesicle is 2.7 x 2 x 0.6 cm.  Both are grossly unremarkable. Vasa deferens: The right vas deferens is 4 cm long by 0.5 cm in diameter and the left vas deferens is 3.5 cm long by 0.5 cm in diameter.  Both are grossly unremarkable. Description of remainder tissue: At the anterior aspect there is a 3.6 cm long linear staple line.   The cut surface i s tan with periurethral nodularity extending from mid prostate to base with prominence at the anterior aspect.  The nodules are up to 1 cm in greatest dimension. Additionally extending throughout the remainder of the prostate more prominently at the posterior aspect are multiple unilocular cysts, up to 0.2 cm in greatest dimension.  Block summary: 1 - 2 - right apical margin, radially sectioned and submitted entirely 3 - 4 - left apical margin, radially sectioned and submitted entirely 5 - 6 - right bladder neck margin, radially sectioned and submitted entirely 7 - 8 - left bladder neck margin, radially sectioned and submitted entirely 9 - 12 - seminal vesicles and vasa deferens insertion into prostate, perpendicularly sectioned, submitted entirely and sequentially from right to left 13 - 31 - right prostate, submitted entirely and sequentially from apex to base 32 - 51 - left prostate, submitted entirely and sequentially from apex to base  RB 08/03/2022  Final D iagnosis performed by Katherine Mantle, MD.   Electronically signed 08/05/2022 2:25:03PM The electronic signature indicates that the named Attending Pathologist has evaluated the specimen Technical component performed at [facility], [address], [place], [place] [address] Lab: [phone] Dir: Jolene Schimke, MD, MMM  Professional component performed at [facility], [facility], [address] [place], [place], [place] [address] Lab: [phone] Dir: Beryle Quant, MD   CBC     Status: Abnormal   Collection Time: 08/03/22  4:12 AM  Result Value Ref Range   WBC 11.4 (H) 4.0 - 10.5 K/uL   RBC 4.13 (L) 4.22 - 5.81 MIL/uL   Hemoglobin 13.1 13.0 - 17.0 g/dL   HCT 10.2 (L) 72.5 - 36.6 %   MCV 92.7 80.0 - 100.0 fL   MCH  31.7 26.0 - 34.0 pg   MCHC 34.2 30.0 - 36.0 g/dL   RDW 16.1 09.6 - 04.5 %   Platelets 192 150 - 400 K/uL   nRBC 0.0 0.0 - 0.2 %    Comment: Performed at [facility], [address] [place], [place] [address]  Basic metabolic panel     Status: Abnormal   Collection Time: 08/03/22  4:12 AM  Result Value Ref Range   Sodium 134 (L) 135 - 145 mmol/L   Potassium 3.9 3.5 - 5.1 mmol/L   Chloride 103 98 - 111 mmol/L   CO2 23 22 - 32 mmol/L   Glucose, Bld 143 (H) 70 - 99 mg/dL    Comment: Glucose reference range applies only to samples taken after fasting for at least 8 hours.   BUN 19 8 - 23 mg/dL   Creatinine, Ser 1.91 0.61 - 1.24 mg/dL   Calcium 8.7 (L) 8.9 - 10.3 mg/dL   GFR, Estimated >47 >82 mL/min    Comment: (NOTE) Calculated using the CKD-EPI Creatinine Equation (2021)    Anion gap 8 5 - 15    Comment: Performed at [facility], [address] [place], [place] [address]  BLADDER SCAN AMB NON-IMAGING     Status: None   Collection Time: 08/09/22 10:27 AM  Result Value Ref Range   Scan Result 118 ml   Basic metabolic panel     Status: None   Collection Time: 08/09/22 10:37 AM  Result Value Ref Range   Sodium 137 135 - 145 mmol/L   Potassium 4.2 3.5 - 5.1 mmol/L   Chloride 104 98 - 111 mmol/L   CO2 27 22 - 32 mmol/L   Glucose, Bld 91 70 - 99 mg/dL    Comment: Glucose reference range applies only to samples taken after fasting for at least 8 hours.   BUN 22 8 - 23 mg/dL   Creatinine, Ser 3.08 0.61 - 1.24 mg/dL   Calcium 9.0 8.9 - 65.7 mg/dL   GFR, Estimated >84 >69 mL/min    Comment: (NOTE) Calculated using the CKD-EPI Creatinine Equation (2021)    Anion gap 6 5 - 15    Comment: Performed at [facility], [address] [place], [place] [address]  CBC     Status: Abnormal   Collection Time: 08/09/22 10:37 AM  Result Value Ref Range   WBC 6.2 4.0 - 10.5 K/uL   RBC 4.15 (L) 4.22 - 5.81 MIL/uL   Hemoglobin 13.1 13.0 - 17.0 g/dL   HCT 84.1 32.4 - 40.1 %   MCV 96.1 80.0 - 100.0 fL   MCH 31.6 26.0 - 34.0 pg   MCHC 32.8 30.0 - 36.0 g/dL   RDW 02.7 25.3 - 66.4 %   Platelets 255 150 - 400 K/uL   nRBC 0.0 0.0 - 0.2 %     Comment: Performed at [facility], [address] [place], [place] [address]  TB Skin Test     Status: Normal   Collection Time: 09/09/22  1:20 PM  Result Value Ref Range   TB Skin Test Negative    Induration 0 mm  PSA     Status: None   Collection Time: 09/13/22 11:42 AM  Result Value Ref Range   Prostate Specific Ag, Serum <0.1 0.0 - 4.0 ng/mL    Comment: Roche ECLIA methodology. According to the American Urological Association, Serum PSA should decrease and remain at undetectable levels after radical prostatectomy. The  AUA defines biochemical recurrence as an initial PSA value 0.2 ng/mL or greater followed by a subsequent confirmatory PSA value 0.2 ng/mL or greater. Values obtained with different assay methods or kits cannot be used interchangeably. Results cannot be interpreted as absolute evidence of the presence or absence of malignant disease.      Psychiatric Specialty Exam: Physical Exam  Review of Systems  Weight 157 lb (71.2 kg).There is no height or weight on file to calculate BMI.  General Appearance: NA  Eye Contact:  NA  Speech:  Slow  Volume:  Decreased  Mood:  Euthymic  Affect:  Labile  Thought Process:  Descriptions of Associations: Circumstantial  Orientation:  Full (Time, Place, and Person)  Thought Content:   poverty of thought content  Suicidal Thoughts:  No  Homicidal Thoughts:  No  Memory:  Immediate;   Fair Recent;   Poor Remote;   Fair  Judgement:  Fair  Insight:  Shallow  Psychomotor Activity:  NA  Concentration:  Concentration: Poor and Attention Span: Poor  Recall:  Poor  Fund of Knowledge:  Fair  Language:  Fair  Akathisia:  No  Handed:  Right  AIMS (if indicated):     Assets:  Desire for Improvement Housing Social Support  ADL's:  Intact  Cognition:  Impaired,  Mild  Sleep:  ok     Assessment/Plan: Schizoaffective disorder, bipolar type (HCC) - Plan: OLANZapine (ZYPREXA) 5 MG tablet, traZODone (DESYREL) 100 MG  tablet  Borderline intellectual functioning - Plan: OLANZapine (ZYPREXA) 5 MG tablet  I reviewed blood work results, discharge summary, collateral information from other providers.  He is no longer taking Cogentin which was discontinued upon discharge from the hospital.  I spoke to be staffed in length about his mood, behavior and it appears he has been stable on olanzapine and trazodone and does not have any anger or aggressive behavior.  He does talk to himself but not a management issue.  I encouraged to have his next appointment in person as he cannot do video appointments.  I will continue olanzapine 5 mg at bedtime and trazodone 100 mg at bedtime.  Patient has no tremors or shakes.  We will follow-up in 11-month.  Recommend to call us back if is any question or any concern.   Follow Up Instructions:     I discussed the assessment and treatment plan with the patient. The patient was provided an opportunity to ask questions and all were answered. The patient agreed with the plan and demonstrated an understanding of the instructions.   The patient was advised to call back or seek an in-person evaluation if the symptoms worsen or if the condition fails to improve as anticipated.    Collaboration of Care: Other provider involved in patient's care AEB notes are available in epic to review.  Patient/Guardian was advised Release of Information must be obtained prior to any record release in order to collaborate their care with an outside provider. Patient/Guardian was advised if they have not already done so to contact the registration department to sign all necessary forms in order for [place] to release information regarding their care.   Consent: Patient/Guardian gives verbal consent for treatment and assignment of benefits for services provided during this visit. Patient/Guardian expressed understanding and agreed to proceed.     I provided 23 minutes of non face to face time during this  encounter.  Note: This document was prepared by [employer] and any errors that results from  this process are unintentional.    Cleotis Nipper, MD 10/13/2022

## 2022-10-23 ENCOUNTER — Other Ambulatory Visit (HOSPITAL_COMMUNITY): Payer: Self-pay | Admitting: Psychiatry

## 2022-10-23 ENCOUNTER — Other Ambulatory Visit: Payer: Self-pay | Admitting: Internal Medicine

## 2022-11-27 ENCOUNTER — Other Ambulatory Visit: Payer: Self-pay

## 2022-11-27 DIAGNOSIS — F259 Schizoaffective disorder, unspecified: Secondary | ICD-10-CM | POA: Insufficient documentation

## 2022-11-27 DIAGNOSIS — R451 Restlessness and agitation: Secondary | ICD-10-CM | POA: Diagnosis present

## 2022-11-27 DIAGNOSIS — Z79899 Other long term (current) drug therapy: Secondary | ICD-10-CM | POA: Diagnosis not present

## 2022-11-27 DIAGNOSIS — I10 Essential (primary) hypertension: Secondary | ICD-10-CM | POA: Diagnosis not present

## 2022-11-27 NOTE — ED Triage Notes (Addendum)
Pt to ed from via [facility] [phone]) for aggressive behavior x 1 week. Employer from group home wants pt IVC'd due to increased aggression. Pt is calm and cooperative in triage and no complaints. Staff in room advised pt "he eats good and sleeps good". Pt is prescribed xyprexa and had a phone visit recently and MD was supposed to increase the dose of zyprexa but they havent done so yet.   [place] April [phone] She states [employer] has been out several times and has refused to IVC the pt, she went to the magistrates office in [place] and they refused to IVC the pt as well. So she brought him in to the ER tonight to be assessed.

## 2022-11-28 ENCOUNTER — Emergency Department
Admission: EM | Admit: 2022-11-28 | Discharge: 2022-11-28 | Disposition: A | Discharge status: Home / Self Care | Payer: Medicare HMO | Attending: Emergency Medicine | Admitting: Emergency Medicine

## 2022-11-28 DIAGNOSIS — F259 Schizoaffective disorder, unspecified: Secondary | ICD-10-CM

## 2022-11-28 LAB — CBC
HCT: 41.5 % (ref 39.0–52.0)
Hemoglobin: 13.8 g/dL (ref 13.0–17.0)
MCH: 31.6 pg (ref 26.0–34.0)
MCHC: 33.3 g/dL (ref 30.0–36.0)
MCV: 95 fL (ref 80.0–100.0)
Platelets: 209 10*3/uL (ref 150–400)
RBC: 4.37 MIL/uL (ref 4.22–5.81)
RDW: 12.2 % (ref 11.5–15.5)
WBC: 6.8 10*3/uL (ref 4.0–10.5)
nRBC: 0 % (ref 0.0–0.2)

## 2022-11-28 LAB — ETHANOL: Alcohol, Ethyl (B): <10 mg/dL (ref <10)

## 2022-11-28 LAB — URINALYSIS, ROUTINE W REFLEX MICROSCOPIC
Bacteria, UA: NONE SEEN
Bilirubin Urine: NEGATIVE
Glucose, UA: NEGATIVE mg/dL
Ketones, ur: NEGATIVE mg/dL
Leukocytes,Ua: NEGATIVE
Nitrite: NEGATIVE
Protein, ur: NEGATIVE mg/dL
Specific Gravity, Urine: 1.018 (ref 1.005–1.030)
Squamous Epithelial / HPF: NONE SEEN /HPF (ref 0–5)
pH: 5 (ref 5.0–8.0)

## 2022-11-28 LAB — COMPREHENSIVE METABOLIC PANEL
ALT: 22 U/L (ref 0–44)
AST: 33 U/L (ref 15–41)
Albumin: 4.8 g/dL (ref 3.5–5.0)
Alkaline Phosphatase: 56 U/L (ref 38–126)
Anion gap: 8 (ref 5–15)
BUN: 19 mg/dL (ref 8–23)
CO2: 22 mmol/L (ref 22–32)
Calcium: 8.8 mg/dL — ABNORMAL LOW (ref 8.9–10.3)
Chloride: 106 mmol/L (ref 98–111)
Creatinine, Ser: 1.28 mg/dL — ABNORMAL HIGH (ref 0.61–1.24)
GFR, Estimated: >60 mL/min (ref >60)
Glucose, Bld: 93 mg/dL (ref 70–99)
Potassium: 3.4 mmol/L — ABNORMAL LOW (ref 3.5–5.1)
Sodium: 136 mmol/L (ref 135–145)
Total Bilirubin: 0.6 mg/dL (ref 0.3–1.2)
Total Protein: 7.3 g/dL (ref 6.5–8.1)

## 2022-11-28 LAB — ACETAMINOPHEN LEVEL: Acetaminophen (Tylenol), Serum: <10 ug/mL — ABNORMAL LOW (ref 10–30)

## 2022-11-28 LAB — URINE DRUG SCREEN, QUALITATIVE (ARMC ONLY)
Amphetamines, Ur Screen: NOT DETECTED
Barbiturates, Ur Screen: NOT DETECTED
Benzodiazepine, Ur Scrn: NOT DETECTED
Cannabinoid 50 Ng, Ur ~~LOC~~: NOT DETECTED
Cocaine Metabolite,Ur ~~LOC~~: NOT DETECTED
MDMA (Ecstasy)Ur Screen: NOT DETECTED
Methadone Scn, Ur: NOT DETECTED
Opiate, Ur Screen: NOT DETECTED
Phencyclidine (PCP) Ur S: NOT DETECTED
Tricyclic, Ur Screen: NOT DETECTED

## 2022-11-28 LAB — SALICYLATE LEVEL: Salicylate Lvl: <7 mg/dL — ABNORMAL LOW (ref 7.0–30.0)

## 2022-11-28 MED ORDER — ALUM & MAG HYDROXIDE-SIMETH 200-200-20 MG/5ML PO SUSP: 30.0000 mL | Freq: Four times a day (QID) | ORAL | Status: DC | PRN

## 2022-11-28 MED ORDER — HALOPERIDOL 5 MG PO TABS
5.0000 mg | ORAL_TABLET | Freq: Once | ORAL | Status: AC
  Administered 2022-11-28: 5 mg via ORAL
  Filled 2022-11-28: qty 1

## 2022-11-28 MED ORDER — IBUPROFEN 600 MG PO TABS: 600.0000 mg | ORAL_TABLET | Freq: Three times a day (TID) | ORAL | Status: DC | PRN

## 2022-11-28 MED ORDER — ONDANSETRON HCL 4 MG PO TABS: 4.0000 mg | ORAL_TABLET | Freq: Three times a day (TID) | ORAL | Status: DC | PRN

## 2022-11-28 NOTE — ED Notes (Signed)
Pt wears briefs at home. Pt placed in clean dry brief in triage.

## 2022-11-28 NOTE — Discharge Instructions (Signed)
Please call your doctor for a follow up visit to reassess your medication dosing.

## 2022-11-28 NOTE — ED Notes (Addendum)
Pt dressed out : Black hate Black/grey shirt Black pants White sneakers Broken glasses in case in pocket

## 2022-11-28 NOTE — ED Provider Notes (Addendum)
[facility] Provider Note    Event Date/Time   First MD Initiated Contact with Patient 11/28/22 [phone]     (approximate)   History   Chief Complaint: Aggressive Behavior (Guardian wants IVC)   HPI  Ian Cook is a 62 y.o. male with a history of hypertension, schizoaffective disorder who was sent to the ED from his family care home due to agitation.  Patient states that he feels well, denies any complaints.  States that he likes to keep to himself at his group home.  Denies any particular conflicts.     Physical Exam   Triage Vital Signs: ED Triage Vitals [11/27/22 2349]  Enc Vitals Group     BP (!) 143/92     Pulse Rate 94     Resp 16     Temp 98 F (36.7 C)     Temp Source Oral     SpO2 98 %     Weight 158 lb 11.7 oz (72 kg)     Height 6\' 4"  (1.93 m)     Head Circumference      Peak Flow      Pain Score 0     Pain Loc      Pain Edu?      Excl. in GC?     Most recent vital signs: Vitals:   11/27/22 2349  BP: (!) 143/92  Pulse: 94  Resp: 16  Temp: 98 F (36.7 C)  SpO2: 98%    General: Awake, no distress. CV:  Good peripheral perfusion.  Resp:  Normal effort.  Abd:  No distention.  Other:  No wounds   ED Results / Procedures / Treatments   Labs (all labs ordered are listed, but only abnormal results are displayed) Labs Reviewed  COMPREHENSIVE METABOLIC PANEL - Abnormal; Notable for the following components:      Result Value   Potassium 3.4 (*)    Creatinine, Ser 1.28 (*)    Calcium 8.8 (*)    All other components within normal limits  SALICYLATE LEVEL - Abnormal; Notable for the following components:   Salicylate Lvl <7.0 (*)    All other components within normal limits  ACETAMINOPHEN LEVEL - Abnormal; Notable for the following components:   Acetaminophen (Tylenol), Serum <10 (*)    All other components within normal limits  ETHANOL  CBC  URINE DRUG SCREEN, QUALITATIVE ([facility] ONLY)      EKG    RADIOLOGY    PROCEDURES:  Procedures   MEDICATIONS ORDERED IN ED: Medications  ibuprofen (ADVIL) tablet 600 mg (has no administration in time range)  ondansetron (ZOFRAN) tablet 4 mg (has no administration in time range)  alum & mag hydroxide-simeth (MAALOX/MYLANTA) 200-200-20 MG/5ML suspension 30 mL (has no administration in time range)     IMPRESSION / MDM / ASSESSMENT AND PLAN / ED COURSE  I reviewed the triage vital signs and the nursing notes.  Patient's presentation is most consistent with exacerbation of chronic illness.  Patient brought to the ED for evaluation of agitation.  Will request behavioral medicine evaluation.  He is calm and cooperative and not committable currently.  The patient has been placed in psychiatric observation due to the need to provide a safe environment for the patient while obtaining psychiatric consultation and evaluation, as well as ongoing medical and medication management to treat the patient's condition.  The patient has not been placed under full IVC at this time.  ----------------------------------------- 3:42 AM on 11/28/2022 -----------------------------------------  D/w behavioral medicine team who advises pt is clear for discharge. They note his outpt psych professional kept dosing unchanged at last visit but advised that they should be contacted for dose adjustment if pt had agitation. Will refer pt back to outpt professional.     FINAL CLINICAL IMPRESSION(S) / ED DIAGNOSES   Final diagnoses:  Schizoaffective disorder, unspecified type (HCC)     Rx / DC Orders   ED Discharge Orders     None        Note:  This document was prepared using Dragon voice recognition software and may include unintentional dictation errors.   Sharman Cheek, MD 11/28/22 Wendie Agreste    Sharman Cheek, MD 11/28/22 [phone]

## 2022-11-28 NOTE — ED Notes (Signed)
Patient discharged at this time. Ambulated to lobby with independent and steady gait. Breathing unlabored speaking in full sentences. Verbalized understanding of all discharge, follow up, and medication teaching. Discharged homed with all belongings.   

## 2022-11-29 ENCOUNTER — Telehealth (HOSPITAL_COMMUNITY): Payer: Self-pay

## 2022-11-29 ENCOUNTER — Other Ambulatory Visit (HOSPITAL_COMMUNITY): Payer: Self-pay | Admitting: Psychiatry

## 2022-11-29 MED ORDER — OLANZAPINE 2.5 MG PO TABS: 2.5000 mg | ORAL_TABLET | Freq: Every morning | ORAL | 0 refills | Status: DC

## 2022-11-29 NOTE — Telephone Encounter (Signed)
This is a patient of Dr. Lolly Mustache, I was advised you are covering for him.  Patient went to the ED this weekend, he lives in [facility] and they called to say that patient has been getting increasingly agitated and violent. Patient was last seen in May via video. Patients prescriptions are Zyprexa 5 mg and Tramadol 100 mg. Patient was given some Haldol in the ED but was not sent home with a prescription. He does not have a follow up until November, please review and advise, thank you

## 2022-11-29 NOTE — Telephone Encounter (Signed)
Tell group home I added olanzapine 2.5 mg in the am. He also should have a sooner appt with Dr. Lolly Mustache

## 2022-12-01 ENCOUNTER — Ambulatory Visit: Payer: Medicare HMO | Attending: Urology | Admitting: Physical Therapy

## 2022-12-01 DIAGNOSIS — C61 Malignant neoplasm of prostate: Secondary | ICD-10-CM | POA: Insufficient documentation

## 2022-12-01 NOTE — Therapy (Signed)
  OUTPATIENT PHYSICAL THERAPY NOTE   Patient Name: Ian Cook MRN: [id number] DOB:1960/12/17, 62 y.o., male Today's Date: 12/01/2022   PT End of Session - 12/01/22 1441     Visit Number 0    PT Start Time 1430    PT Stop Time 1440    PT Time Calculation (min) 10 min             Past Medical History:  Diagnosis Date   Anxiety    Glaucoma    HTN (hypertension) 11/01/2014   Hypertension    Prostate cancer (HCC)    Schizo-affective schizophrenia (HCC)    Schizoaffective disorder, manic type (HCC) 10/30/2014   Tobacco use disorder 11/01/2014   Past Surgical History:  Procedure Laterality Date   PELVIC LYMPH NODE DISSECTION Bilateral 08/02/2022   Procedure: PELVIC LYMPH NODE DISSECTION;  Surgeon: Vanna Scotland, MD;  Location: [facility] ORS;  Service: Urology;  Laterality: Bilateral;   ROBOT ASSISTED LAPAROSCOPIC RADICAL PROSTATECTOMY N/A 08/02/2022   Procedure: XI ROBOTIC ASSISTED LAPAROSCOPIC RADICAL PROSTATECTOMY;  Surgeon: Vanna Scotland, MD;  Location: [facility] ORS;  Service: Urology;  Laterality: N/A;   TONSILLECTOMY     Patient Active Problem List   Diagnosis Date Noted   Prostate cancer (HCC) 08/02/2022   HTN (hypertension) 11/01/2014   Tobacco use disorder 11/01/2014   Schizoaffective disorder, manic type (HCC) 10/30/2014      REFERRING PROVIDER: Lurlean Horns DIAG: Prostate Cancer   Rationale for Evaluation and Treatment Rehabilitation  THERAPY DIAG:  Prostate cancer (HCC)   NOTE  Pt was referred to pelvic PT post prostate surgery, Pt arrived today for evaluation with caregiver. Pt reported he is doing much better with leakage and declined Pelvic PT.  Session was not billed.     Mariane Masters, PT 12/01/2022, 5:47 PM

## 2022-12-02 ENCOUNTER — Encounter (HOSPITAL_COMMUNITY): Payer: Self-pay | Admitting: Psychiatry

## 2022-12-02 ENCOUNTER — Telehealth (HOSPITAL_BASED_OUTPATIENT_CLINIC_OR_DEPARTMENT_OTHER): Payer: Medicare HMO | Admitting: Psychiatry

## 2022-12-02 VITALS — Wt 158.0 lb

## 2022-12-02 DIAGNOSIS — R4183 Borderline intellectual functioning: Secondary | ICD-10-CM

## 2022-12-02 DIAGNOSIS — F250 Schizoaffective disorder, bipolar type: Secondary | ICD-10-CM

## 2022-12-02 MED ORDER — OLANZAPINE 2.5 MG PO TABS: 2.5000 mg | ORAL_TABLET | Freq: Every morning | ORAL | 1 refills | Status: DC

## 2022-12-02 NOTE — Progress Notes (Signed)
[facility] MD Virtual Progress Note   Patient Location: Group home Provider Location: Office  I connect with patient by telephone and verified that I am speaking with correct person by using two identifiers. I discussed the limitations of evaluation and management by telemedicine and the availability of in person appointments. I also discussed with the patient that there may be a patient responsible charge related to this service. The patient expressed understanding and agreed to proceed.  Ian Cook [id number] 62 y.o.  12/02/2022 3:42 PM  History of Present Illness:  Patient is seen earlier than his scheduled appointment.  He was apparently seen in the emergency room a few days ago after group home staff brought him for evaluation.  I talked with the patient and he reported everything is fine but realized he was seen recently in the emergency room because of concern.  Patient did not elaborate and wants to talk to the administrator Peggyann Shoals who was not present at this moment.  I spoke to caregiver person Alvira Philips who mentioned that patient was upset and had an argument with another resident at the group home.  There was no physical aggression or violence but patient was very upset and after the argument they could not find him until they have to call the police.  Police picked him nearby and staff decided to have evaluation in the emergency room.  He did not meet criteria for IVC and hospitalization.  He was discharged same day.  Staff reported patient is now better than before.  He is sleeping but is still communicating with the resident that he had issue with.  Patient did not elaborate what was the issue but otherwise he has been sleeping okay and eating well.  He tried to keep himself alone and does not bother by other people.  Patient has borderline intellect.  Sometimes he talk to himself but overall has been very calm for the past many years and never got violent or  aggressive.  Patient was recommended to take additional 2.5 mg olanzapine in the morning.  Staff is giving him olanzapine 2 point 5 in the morning and he is also taking trazodone and olanzapine 5 mg at bedtime.  He has no tremors, shakes or any EPS.  He is tolerating medicine and reported no tremors.  Patient denies any suicidal thoughts or homicidal thoughts.  He denies any issue taking the extra medication.  He denies any hallucination or any paranoia.  Staff appears to be okay with his current condition.  Past Psychiatric History: H/O psychosis, mania and running away from the group home. Multiple inpatient and last inpatient in May 2016. Given Haldol as needed for agitation in group home. H/O taking Navane and Latuda.  No h/o suicidal attempt and self abusive behavior.    Outpatient Encounter Medications as of 12/02/2022  Medication Sig   OLANZapine (ZYPREXA) 2.5 MG tablet Take 1 tablet (2.5 mg total) by mouth every morning.   AMBULATORY NON FORMULARY MEDICATION ADULT PULL UPS-due to urinary leakage   amLODipine-benazepril (LOTREL) 10-20 MG capsule Take 1 capsule by mouth every morning.   atorvastatin (LIPITOR) 10 MG tablet TAKE 1 TABLET BY MOUTH EVERY EVENING (Patient not taking: Reported on 11/28/2022)   benztropine (COGENTIN) 0.5 MG tablet Take 0.5 mg by mouth daily.   brimonidine (ALPHAGAN) 0.2 % ophthalmic solution Place 1 drop into the right eye daily.   docusate sodium (COLACE) 100 MG capsule Take 1 capsule (100 mg total) by mouth 2 (two) times  daily.   dorzolamide-timolol (COSOPT) 2-0.5 % ophthalmic solution Place 1 drop into the right eye 2 (two) times daily.   latanoprost (XALATAN) 0.005 % ophthalmic solution Place 1 drop into the right eye at bedtime. (Patient not taking: Reported on 11/28/2022)   methimazole (TAPAZOLE) 5 MG tablet Take 2.5 mg by mouth every morning.   OLANZapine (ZYPREXA) 5 MG tablet Take 1 tablet (5 mg total) by mouth at bedtime.   oxybutynin (DITROPAN) 5 MG tablet  Take 1 tablet (5 mg total) by mouth every 8 (eight) hours as needed for bladder spasms. (Patient not taking: Reported on 11/28/2022)   oxyCODONE-acetaminophen (PERCOCET/ROXICET) 5-325 MG tablet Take 1-2 tablets by mouth every 6 (six) hours as needed for moderate pain. (Patient not taking: Reported on 11/28/2022)   ROCKLATAN 0.02-0.005 % SOLN Place 1 drop into both eyes at bedtime.   tamsulosin (FLOMAX) 0.4 MG CAPS capsule Take 0.4 mg by mouth daily.   traZODone (DESYREL) 100 MG tablet Take 1 tablet (100 mg total) by mouth at bedtime.   No facility-administered encounter medications on file as of 12/02/2022.    Recent Results (from the past 2160 hour(s))  TB Skin Test     Status: Normal   Collection Time: 09/09/22  1:20 PM  Result Value Ref Range   TB Skin Test Negative    Induration 0 mm  PSA     Status: None   Collection Time: 09/13/22 11:42 AM  Result Value Ref Range   Prostate Specific Ag, Serum <0.1 0.0 - 4.0 ng/mL    Comment: Roche ECLIA methodology. According to the American Urological Association, Serum PSA should decrease and remain at undetectable levels after radical prostatectomy. The AUA defines biochemical recurrence as an initial PSA value 0.2 ng/mL or greater followed by a subsequent confirmatory PSA value 0.2 ng/mL or greater. Values obtained with different assay methods or kits cannot be used interchangeably. Results cannot be interpreted as absolute evidence of the presence or absence of malignant disease.   Comprehensive metabolic panel     Status: Abnormal   Collection Time: 11/27/22 11:54 PM  Result Value Ref Range   Sodium 136 135 - 145 mmol/L   Potassium 3.4 (L) 3.5 - 5.1 mmol/L   Chloride 106 98 - 111 mmol/L   CO2 22 22 - 32 mmol/L   Glucose, Bld 93 70 - 99 mg/dL    Comment: Glucose reference range applies only to samples taken after fasting for at least 8 hours.   BUN 19 8 - 23 mg/dL   Creatinine, Ser 6.38 (H) 0.61 - 1.24 mg/dL   Calcium 8.8 (L) 8.9 -  10.3 mg/dL   Total Protein 7.3 6.5 - 8.1 g/dL   Albumin 4.8 3.5 - 5.0 g/dL   AST 33 15 - 41 U/L   ALT 22 0 - 44 U/L   Alkaline Phosphatase 56 38 - 126 U/L   Total Bilirubin 0.6 0.3 - 1.2 mg/dL   GFR, Estimated >75 >64 mL/min    Comment: (NOTE) Calculated using the CKD-EPI Creatinine Equation (2021)    Anion gap 8 5 - 15    Comment: Performed at [facility], [address] [place], [place] [address]  Ethanol     Status: None   Collection Time: 11/27/22 11:54 PM  Result Value Ref Range   Alcohol, Ethyl (B) <10 <10 mg/dL    Comment: (NOTE) Lowest detectable limit for serum alcohol is 10 mg/dL.  For medical purposes only. Performed at [facility],  [address], [place], [place] [address]   Salicylate level     Status: Abnormal   Collection Time: 11/27/22 11:54 PM  Result Value Ref Range   Salicylate Lvl <7.0 (L) 7.0 - 30.0 mg/dL    Comment: Performed at [facility], [address] [place], [place] [address]  Acetaminophen level     Status: Abnormal   Collection Time: 11/27/22 11:54 PM  Result Value Ref Range   Acetaminophen (Tylenol), Serum <10 (L) 10 - 30 ug/mL    Comment: (NOTE) Therapeutic concentrations vary significantly. A range of 10-30 ug/mL  may be an effective concentration for many patients. However, some  are best treated at concentrations outside of this range. Acetaminophen concentrations >150 ug/mL at 4 hours after ingestion  and >50 ug/mL at 12 hours after ingestion are often associated with  toxic reactions.  Performed at [facility], [address] [place], [place] [address]   cbc     Status: None   Collection Time: 11/27/22 11:54 PM  Result Value Ref Range   WBC 6.8 4.0 - 10.5 K/uL   RBC 4.37 4.22 - 5.81 MIL/uL   Hemoglobin 13.8 13.0 - 17.0 g/dL   HCT 25.9 56.3 - 87.5 %   MCV 95.0 80.0 - 100.0 fL   MCH 31.6 26.0 - 34.0 pg   MCHC 33.3 30.0 - 36.0 g/dL   RDW 64.3 32.9 - 51.8 %   Platelets 209 150 -  400 K/uL   nRBC 0.0 0.0 - 0.2 %    Comment: Performed at [facility], [address], [place], [place] [address]  Urine Drug Screen, Qualitative     Status: None   Collection Time: 11/28/22  3:30 AM  Result Value Ref Range   Tricyclic, Ur Screen NONE DETECTED NONE DETECTED   Amphetamines, Ur Screen NONE DETECTED NONE DETECTED   MDMA (Ecstasy)Ur Screen NONE DETECTED NONE DETECTED   Cocaine Metabolite,Ur [place] NONE DETECTED NONE DETECTED   Opiate, Ur Screen NONE DETECTED NONE DETECTED   Phencyclidine (PCP) Ur S NONE DETECTED NONE DETECTED   Cannabinoid 50 Ng, Ur [place] NONE DETECTED NONE DETECTED   Barbiturates, Ur Screen NONE DETECTED NONE DETECTED   Benzodiazepine, Ur Scrn NONE DETECTED NONE DETECTED   Methadone Scn, Ur NONE DETECTED NONE DETECTED    Comment: (NOTE) Tricyclics + metabolites, urine    Cutoff 1000 ng/mL Amphetamines + metabolites, urine  Cutoff 1000 ng/mL MDMA (Ecstasy), urine              Cutoff 500 ng/mL Cocaine Metabolite, urine          Cutoff 300 ng/mL Opiate + metabolites, urine        Cutoff 300 ng/mL Phencyclidine (PCP), urine         Cutoff 25 ng/mL Cannabinoid, urine                 Cutoff 50 ng/mL Barbiturates + metabolites, urine  Cutoff 200 ng/mL Benzodiazepine, urine              Cutoff 200 ng/mL Methadone, urine                   Cutoff 300 ng/mL  The urine drug screen provides only a preliminary, unconfirmed analytical test result and should not be used for non-medical purposes. Clinical consideration and professional judgment should be applied to any positive drug screen result due to possible interfering substances. A more specific alternate chemical method must be used in  order to obtain a confirmed analytical result. Gas chromatography / mass spectrometry (GC/MS) is the preferred confirm atory method. Performed at [facility], [address] [place], [place] [address]   Urinalysis, Routine w reflex microscopic -Urine, Clean  Catch     Status: Abnormal   Collection Time: 11/28/22  3:30 AM  Result Value Ref Range   Color, Urine YELLOW (A) YELLOW   APPearance CLEAR (A) CLEAR   Specific Gravity, Urine 1.018 1.005 - 1.030   pH 5.0 5.0 - 8.0   Glucose, UA NEGATIVE NEGATIVE mg/dL   Hgb urine dipstick SMALL (A) NEGATIVE   Bilirubin Urine NEGATIVE NEGATIVE   Ketones, ur NEGATIVE NEGATIVE mg/dL   Protein, ur NEGATIVE NEGATIVE mg/dL   Nitrite NEGATIVE NEGATIVE   Leukocytes,Ua NEGATIVE NEGATIVE   RBC / HPF 0-5 0 - 5 RBC/hpf   WBC, UA 0-5 0 - 5 WBC/hpf   Bacteria, UA NONE SEEN NONE SEEN   Squamous Epithelial / HPF NONE SEEN 0 - 5 /HPF   Mucus PRESENT    Hyaline Casts, UA PRESENT     Comment: Performed at [facility], [address], [place], [place] [address]     Psychiatric Specialty Exam: Physical Exam  Review of Systems  Weight 158 lb (71.7 kg).There is no height or weight on file to calculate BMI.  General Appearance: NA  Eye Contact:  NA  Speech:  Slow  Volume:  Decreased  Mood:  Irritable  Affect:  NA  Thought Process:  Descriptions of Associations: Intact  Orientation:  Full (Time, Place, and Person)  Thought Content:  Rumination  Suicidal Thoughts:  No  Homicidal Thoughts:  No  Memory:  Immediate;   Fair Recent;   Fair Remote;   Poor  Judgement:  Fair  Insight:  Shallow  Psychomotor Activity:  NA  Concentration:  Concentration: Fair and Attention Span: Fair  Recall:  Poor  Fund of Knowledge:  Poor  Language:  Fair  Akathisia:  No  Handed:  Right  AIMS (if indicated):     Assets:  Desire for Improvement Housing Social Support  ADL's:  Intact  Cognition:  Impaired,  Mild  Sleep:  fair     Assessment/Plan: Schizoaffective disorder, bipolar type (HCC) - Plan: OLANZapine (ZYPREXA) 2.5 MG tablet  Borderline intellectual functioning - Plan: OLANZapine (ZYPREXA) 2.5 MG tablet  I reviewed notes from recent emergency room visit.  I encourage he should come in person next  appointment in 2 months.  For now he will continue olanzapine 5 mg at bedtime and additional olanzapine 2.5 mg in the morning.  He will continue trazodone 100 mg at bedtime.  We will move the appointment in 2 months but previously he was scheduled in December.  I review current medication.  I also told the staff if noticed worsening of symptoms and should not wait for 16-month appointment and call us earlier.    Follow Up Instructions:     I discussed the assessment and treatment plan with the patient. The patient was provided an opportunity to ask questions and all were answered. The patient agreed with the plan and demonstrated an understanding of the instructions.   The patient was advised to call back or seek an in-person evaluation if the symptoms worsen or if the condition fails to improve as anticipated.    Collaboration of Care: Other provider involved in patient's care AEB notes are available in epic to review  Patient/Guardian was advised Release of Information must be  obtained prior to any record release in order to collaborate their care with an outside provider. Patient/Guardian was advised if they have not already done so to contact the registration department to sign all necessary forms in order for [place] to release information regarding their care.   Consent: Patient/Guardian gives verbal consent for treatment and assignment of benefits for services provided during this visit. Patient/Guardian expressed understanding and agreed to proceed.     I provided 16 minutes of non face to face time during this encounter.  Note: This document was prepared by [employer] voice dictation technology and any errors that results from this process are unintentional.    Cleotis Nipper, MD 12/02/2022

## 2022-12-08 ENCOUNTER — Encounter: Payer: Medicare HMO | Admitting: Physical Therapy

## 2022-12-17 ENCOUNTER — Encounter: Payer: Medicare HMO | Admitting: Physical Therapy

## 2022-12-21 ENCOUNTER — Encounter: Payer: Medicare HMO | Admitting: Physical Therapy

## 2022-12-25 ENCOUNTER — Other Ambulatory Visit (HOSPITAL_COMMUNITY): Payer: Self-pay | Admitting: Psychiatry

## 2022-12-25 ENCOUNTER — Other Ambulatory Visit: Payer: Self-pay | Admitting: Internal Medicine

## 2023-01-03 ENCOUNTER — Encounter: Payer: Self-pay | Admitting: Internal Medicine

## 2023-01-03 ENCOUNTER — Ambulatory Visit (INDEPENDENT_AMBULATORY_CARE_PROVIDER_SITE_OTHER): Payer: Self-pay | Admitting: Internal Medicine

## 2023-01-03 VITALS — BP 140/90 | HR 59 | Ht 76.0 in | Wt 163.6 lb

## 2023-01-03 DIAGNOSIS — C61 Malignant neoplasm of prostate: Secondary | ICD-10-CM

## 2023-01-03 DIAGNOSIS — E0590 Thyrotoxicosis, unspecified without thyrotoxic crisis or storm: Secondary | ICD-10-CM

## 2023-01-03 DIAGNOSIS — F17210 Nicotine dependence, cigarettes, uncomplicated: Secondary | ICD-10-CM

## 2023-01-03 DIAGNOSIS — E782 Mixed hyperlipidemia: Secondary | ICD-10-CM

## 2023-01-03 DIAGNOSIS — I10 Essential (primary) hypertension: Secondary | ICD-10-CM

## 2023-01-03 MED ORDER — AMLODIPINE BESY-BENAZEPRIL HCL 10-40 MG PO CAPS
1.0000 | ORAL_CAPSULE | Freq: Every day | ORAL | 0 refills | Status: DC
Start: 2023-01-03 — End: 2023-04-18

## 2023-01-03 NOTE — Progress Notes (Signed)
Established Patient Office Visit  Subjective:  Patient ID: Ian Cook, male    DOB: 15-Jun-1960  Age: 62 y.o. MRN: [id number]  Chief Complaint  Patient presents with   Follow-up    Med Refill    No new complaints, here for medication refills.     No other concerns at this time.   Past Medical History:  Diagnosis Date   Anxiety    Glaucoma    HTN (hypertension) 11/01/2014   Hypertension    Prostate cancer (HCC)    Schizo-affective schizophrenia (HCC)    Schizoaffective disorder, manic type (HCC) 10/30/2014   Tobacco use disorder 11/01/2014    Past Surgical History:  Procedure Laterality Date   PELVIC LYMPH NODE DISSECTION Bilateral 08/02/2022   Procedure: PELVIC LYMPH NODE DISSECTION;  Surgeon: Vanna Scotland, MD;  Location: [facility];  Service: Urology;  Laterality: Bilateral;   ROBOT ASSISTED LAPAROSCOPIC RADICAL PROSTATECTOMY N/A 08/02/2022   Procedure: XI ROBOTIC ASSISTED LAPAROSCOPIC RADICAL PROSTATECTOMY;  Surgeon: Vanna Scotland, MD;  Location: [facility];  Service: Urology;  Laterality: N/A;   TONSILLECTOMY      Social History   Socioeconomic History   Marital status: Married    Spouse name: Not on file   Number of children: Not on file   Years of education: Not on file   Highest education level: Not on file  Occupational History   Not on file  Tobacco Use   Smoking status: Every Day    Current packs/day: 0.75    Average packs/day: 0.8 packs/day for 39.0 years (29.3 ttl pk-yrs)    Types: Cigarettes    Passive exposure: Current   Smokeless tobacco: Never  Vaping Use   Vaping status: Never Used  Substance and Sexual Activity   Alcohol use: No    Alcohol/week: 0.0 standard drinks of alcohol   Drug use: No   Sexual activity: Never  Other Topics Concern   Not on file  Social History Narrative   From Family care home.   Social Determinants of Health   Financial Resource Strain: Not on file  Food Insecurity: Not on file  Transportation Needs:  Not on file  Physical Activity: Not on file  Stress: Not on file  Social Connections: Not on file  Intimate Partner Violence: Not on file    Family History  Problem Relation Age of Onset   Aneurysm Mother    Depression Mother    CAD Father    Kidney cancer Neg Hx    Prostate cancer Neg Hx     No Known Allergies  ROS     Objective:   BP (!) 140/90   Pulse (!) 59   Ht 6\' 4"  (1.93 m)   Wt 163 lb 9.6 oz (74.2 kg)   SpO2 98%   BMI 19.91 kg/m   Vitals:   01/03/23 1426  BP: (!) 140/90  Pulse: (!) 59  Height: 6\' 4"  (1.93 m)  Weight: 163 lb 9.6 oz (74.2 kg)  SpO2: 98%  BMI (Calculated): 19.92    Physical Exam Vitals reviewed.  Constitutional:      Appearance: Normal appearance.  HENT:     Head: Normocephalic.     Left Ear: Ear canal normal. There is no impacted cerumen.     Nose: Nose normal.     Mouth/Throat:     Mouth: Mucous membranes are moist.     Pharynx: No posterior oropharyngeal erythema.  Eyes:     Extraocular Movements: Extraocular movements intact.  Pupils: Pupils are equal, round, and reactive to light.  Cardiovascular:     Rate and Rhythm: Regular rhythm.     Chest Wall: PMI is not displaced.     Pulses: Normal pulses.     Heart sounds: Normal heart sounds. No murmur heard. Pulmonary:     Effort: Pulmonary effort is normal.     Breath sounds: Normal air entry. No rhonchi or rales.  Abdominal:     General: Abdomen is flat. Bowel sounds are normal. There is no distension.     Palpations: Abdomen is soft. There is no hepatomegaly, splenomegaly or mass.     Tenderness: There is no abdominal tenderness.  Musculoskeletal:        General: Normal range of motion.     Cervical back: Normal range of motion and neck supple.     Right lower leg: No edema.     Left lower leg: No edema.  Skin:    General: Skin is warm and dry.  Neurological:     General: No focal deficit present.     Mental Status: He is alert and oriented to person, place, and  time.     Cranial Nerves: No cranial nerve deficit.     Motor: No weakness.  Psychiatric:        Mood and Affect: Mood normal.        Behavior: Behavior normal.      No results found for any visits on 01/03/23.      Assessment & Plan:  As per problem list.  Problem List Items Addressed This Visit       Cardiovascular and Mediastinum   HTN (hypertension) - Primary   Relevant Medications   amLODipine-benazepril (LOTREL) 10-40 MG capsule     Endocrine   Hyperthyroidism   Relevant Orders   TSH     Genitourinary   Prostate cancer (HCC)   Other Visit Diagnoses     Mixed hyperlipidemia       Relevant Medications   amLODipine-benazepril (LOTREL) 10-40 MG capsule   Other Relevant Orders   CK   Hepatic function panel   Lipid panel       Return in about 2 weeks (around 01/17/2023) for fu with labs prior.   Total time spent: 20 minutes  Luna Fuse, MD  01/03/2023   This document may have been prepared by [facility] Voice Recognition software and as such may include unintentional dictation errors.

## 2023-01-04 ENCOUNTER — Other Ambulatory Visit: Payer: Self-pay

## 2023-01-04 DIAGNOSIS — E782 Mixed hyperlipidemia: Secondary | ICD-10-CM | POA: Diagnosis not present

## 2023-01-04 DIAGNOSIS — E0590 Thyrotoxicosis, unspecified without thyrotoxic crisis or storm: Secondary | ICD-10-CM | POA: Diagnosis not present

## 2023-01-07 ENCOUNTER — Encounter: Payer: Medicare HMO | Admitting: Physical Therapy

## 2023-01-17 ENCOUNTER — Ambulatory Visit (INDEPENDENT_AMBULATORY_CARE_PROVIDER_SITE_OTHER): Payer: 59 | Admitting: Internal Medicine

## 2023-01-17 VITALS — BP 120/80 | HR 52 | Ht 76.0 in | Wt 162.6 lb

## 2023-01-17 DIAGNOSIS — I10 Essential (primary) hypertension: Secondary | ICD-10-CM | POA: Diagnosis not present

## 2023-01-17 DIAGNOSIS — E782 Mixed hyperlipidemia: Secondary | ICD-10-CM

## 2023-01-17 DIAGNOSIS — E0590 Thyrotoxicosis, unspecified without thyrotoxic crisis or storm: Secondary | ICD-10-CM

## 2023-01-17 DIAGNOSIS — N400 Enlarged prostate without lower urinary tract symptoms: Secondary | ICD-10-CM

## 2023-01-17 MED ORDER — ATORVASTATIN CALCIUM 10 MG PO TABS
10.0000 mg | ORAL_TABLET | Freq: Every evening | ORAL | 0 refills | Status: DC
Start: 2023-01-17 — End: 2023-04-18

## 2023-01-17 NOTE — Progress Notes (Signed)
Established Patient Office Visit  Subjective:  Patient ID: Ian Cook, male    DOB: [birth year]  Age: 62 y.o. MRN: [id number]  Chief Complaint  Patient presents with   Follow-up    2 WEEK LAB RESULTS    No new complaints, here for lab review and medication refills. LDL and TC well controlled on lab review. Triglycerides also satisfactory.   No other concerns at this time.   Past Medical History:  Diagnosis Date   Anxiety    Glaucoma    HTN (hypertension) 11/01/2014   Hypertension    Prostate cancer (HCC)    Schizo-affective schizophrenia (HCC)    Schizoaffective disorder, manic type (HCC) 10/30/2014   Tobacco use disorder 11/01/2014    Past Surgical History:  Procedure Laterality Date   PELVIC LYMPH NODE DISSECTION Bilateral 08/02/2022   Procedure: PELVIC LYMPH NODE DISSECTION;  Surgeon: Vanna Scotland, MD;  Location: [facility];  Service: Urology;  Laterality: Bilateral;   ROBOT ASSISTED LAPAROSCOPIC RADICAL PROSTATECTOMY N/A 08/02/2022   Procedure: XI ROBOTIC ASSISTED LAPAROSCOPIC RADICAL PROSTATECTOMY;  Surgeon: Vanna Scotland, MD;  Location: [facility];  Service: Urology;  Laterality: N/A;   TONSILLECTOMY      Social History   Socioeconomic History   Marital status: Married    Spouse name: Not on file   Number of children: Not on file   Years of education: Not on file   Highest education level: Not on file  Occupational History   Not on file  Tobacco Use   Smoking status: Every Day    Current packs/day: 0.75    Average packs/day: 0.8 packs/day for 39.0 years (29.3 ttl pk-yrs)    Types: Cigarettes    Passive exposure: Current   Smokeless tobacco: Never  Vaping Use   Vaping status: Never Used  Substance and Sexual Activity   Alcohol use: No    Alcohol/week: 0.0 standard drinks of alcohol   Drug use: No   Sexual activity: Never  Other Topics Concern   Not on file  Social History Narrative   From Family care home.   Social Determinants of Health    Financial Resource Strain: Not on file  Food Insecurity: Not on file  Transportation Needs: Not on file  Physical Activity: Not on file  Stress: Not on file  Social Connections: Not on file  Intimate Partner Violence: Not on file    Family History  Problem Relation Age of Onset   Aneurysm Mother    Depression Mother    CAD Father    Kidney cancer Neg Hx    Prostate cancer Neg Hx     No Known Allergies  Review of Systems  Constitutional: Negative.   HENT: Negative.    Eyes: Negative.   Respiratory: Negative.    Cardiovascular: Negative.   Gastrointestinal: Negative.   Genitourinary: Negative.   Skin: Negative.   Neurological: Negative.   Endo/Heme/Allergies: Negative.        Objective:   BP 120/80   Pulse (!) 52   Ht 6\' 4"  (1.93 m)   Wt 162 lb 9.6 oz (73.8 kg)   SpO2 97%   BMI 19.79 kg/m   Vitals:   01/17/23 1413  BP: 120/80  Pulse: (!) 52  Height: 6\' 4"  (1.93 m)  Weight: 162 lb 9.6 oz (73.8 kg)  SpO2: 97%  BMI (Calculated): 19.8    Physical Exam Vitals reviewed.  Constitutional:      Appearance: Normal appearance.  HENT:     Head: **Note De-Identified via Obfucation** Normocephalic.     Left Ear: Ear canal normal. There i no impacted cerumen.     Noe: Noe normal.     Mouth/Throat:     Mouth: Mucou membrane are moit.     Pharynx: No poterior oropharyngeal erythema.  Eye:     Extraocular Movement: Extraocular movement intact.     Pupil: Pupil are equal, round, and reactive to light.  Cardiovacular:     Rate and Rhythm: Regular rhythm.     Chet Wall: PMI i not diplaced.     Pule: Normal pule.     Heart ound: Normal heart ound. No murmur heard. Pulmonary:     Effort: Pulmonary effort i normal.     Breath ound: Normal air entry. No rhonchi or rale.  Abdominal:     General: Abdomen i flat. Bowel ound are normal. There i no ditenion.     Palpation: Abdomen i oft. There i no hepatomegaly, plenomegaly or ma.     Tenderne: There i no  abdominal tenderne.  Muculokeletal:        General: Normal range of motion.     Cervical back: Normal range of motion and neck upple.     Right lower leg: No edema.     Left lower leg: No edema.  Skin:    General: Skin i warm and dry.  Neurological:     General: No focal deficit preent.     Mental Statu: He i alert and oriented to peron, place, and time.     Cranial Nerve: No cranial nerve deficit.     Motor: No weakne.  Pychiatric:        Mood and Affect: Mood normal.        Behavior: Behavior normal.      No reult found for any viit on 01/17/23.  Recent Reult (from the pat 2160 hour())  Comprehenive metabolic panel     Statu: Abnormal   Collection Time: 11/27/22 11:54 PM  Reult Value Ref Range   Sodium 136 135 - 145 mmol/L   Potaium 3.4 (L) 3.5 - 5.1 mmol/L   Chloride 106 98 - 111 mmol/L   CO2 22 22 - 32 mmol/L   Glucoe, Bld 93 70 - 99 mg/dL    Comment: Glucoe reference range applie only to ample taken after fating for at leat 8 hour.   BUN 19 8 - 23 mg/dL   Creatinine, Ser 9.52 (H) 0.61 - 1.24 mg/dL   Calcium 8.8 (L) 8.9 - 10.3 mg/dL   Total Protein 7.3 6.5 - 8.1 g/dL   Albumin 4.8 3.5 - 5.0 g/dL   AST 33 15 - 41 U/L   ALT 22 0 - 44 U/L   Alkaline Phophatae 56 38 - 126 U/L   Total Bilirubin 0.6 0.3 - 1.2 mg/dL   GFR, Etimated >84 >13 mL/min    Comment: (NOTE) Calculated uing the CKD-EPI Creatinine Equation (2021)    Anion gap 8 5 - 15    Comment: Performed at [facility], [address] [place], [place] [address]  Ethanol     Statu: None   Collection Time: 11/27/22 11:54 PM  Reult Value Ref Range   Alcohol, Ethyl (B) <10 <10 mg/dL    Comment: (NOTE) [place] detectable limit for erum alcohol i 10 mg/dL.  For medical purpoe only. Performed at [facility], [address], [place], [place] [address]   Salicylate level     Statu: Abnormal   Collection Time: 11/27/22  11:54 PM  Result Value Ref  Range   alicylate Lvl <7.0 (L) 7.0 - 30.0 mg/dL    Comment: Performed at [facility], [address] [place], [place] [address]  Acetaminophen level     tatus: Abnormal   Collection Time: 11/27/22 11:54 PM  Result Value Ref Range   Acetaminophen (Tylenol), erum <10 (L) 10 - 30 ug/mL    Comment: (NOTE) Therapeutic concentrations vary significantly. A range of 10-30 ug/mL  may be an effective concentration for many patients. However, some  are best treated at concentrations outside of this range. Acetaminophen concentrations >150 ug/mL at 4 hours after ingestion  and >50 ug/mL at 12 hours after ingestion are often associated with  toxic reactions.  Performed at [facility], [address] [place], [place] [address]   cbc     tatus: None   Collection Time: 11/27/22 11:54 PM  Result Value Ref Range   WBC 6.8 4.0 - 10.5 K/uL   RBC 4.37 4.22 - 5.81 MIL/uL   Hemoglobin 13.8 13.0 - 17.0 g/dL   HCT 32.9 51.8 - 84.1 %   MCV 95.0 80.0 - 100.0 fL   MCH 31.6 26.0 - 34.0 pg   MCHC 33.3 30.0 - 36.0 g/dL   RDW 66.0 63.0 - 16.0 %   Platelets 209 150 - 400 K/uL   nRBC 0.0 0.0 - 0.2 %    Comment: Performed at [facility], [address] [place], [place] [address]  Urine Drug creen, Qualitative     tatus: None   Collection Time: 11/28/22  3:30 AM  Result Value Ref Range   Tricyclic, Ur creen NONE DETECTED NONE DETECTED   Amphetamines, Ur creen NONE DETECTED NONE DETECTED   MDMA (Ecstasy)Ur creen NONE DETECTED NONE DETECTED   Cocaine Metabolite,Ur hiner NONE DETECTED NONE DETECTED   Opiate, Ur creen NONE DETECTED NONE DETECTED   Phencyclidine (PCP) Ur  NONE DETECTED NONE DETECTED   Cannabinoid 50 Ng, Ur Bremer NONE DETECTED NONE DETECTED   Barbiturates, Ur creen NONE DETECTED NONE DETECTED   Benzodiazepine, Ur crn NONE DETECTED NONE DETECTED   Methadone cn, Ur NONE DETECTED NONE DETECTED    Comment: (NOTE) Tricyclics + metabolites, urine    Cutoff  1000 ng/mL Amphetamines + metabolites, urine  Cutoff 1000 ng/mL MDMA (Ecstasy), urine              Cutoff 500 ng/mL Cocaine Metabolite, urine          Cutoff 300 ng/mL Opiate + metabolites, urine        Cutoff 300 ng/mL Phencyclidine (PCP), urine         Cutoff 25 ng/mL Cannabinoid, urine                 Cutoff 50 ng/mL Barbiturates + metabolites, urine  Cutoff 200 ng/mL Benzodiazepine, urine              Cutoff 200 ng/mL Methadone, urine                   Cutoff 300 ng/mL  The urine drug screen provides only a preliminary, unconfirmed analytical test result and should not be used for non-medical purposes. Clinical consideration and professional judgment should be applied to any positive drug screen result due to possible interfering substances. A more specific alternate chemical method must be used in order to obtain a confirmed analytical result. Gas chromatography / mass spectrometry (GC/M) is the preferred confirm atory method. Performed at  [facility], [address] [place], [place] [address]   Urinalysis, Routine w reflex microscopic -Urine, Clean Catch     tatus: Abnormal   Collection Time: 11/28/22  3:30 AM  Result Value Ref Range   Color, Urine YELLOW (A) YELLOW   APPearance CLEAR (A) CLEAR   pecific Gravity, Urine 1.018 1.005 - 1.030   pH 5.0 5.0 - 8.0   Glucose, UA NEGATIVE NEGATIVE mg/dL   Hgb urine dipstick MALL (A) NEGATIVE   Bilirubin Urine NEGATIVE NEGATIVE   Ketones, ur NEGATIVE NEGATIVE mg/dL   Protein, ur NEGATIVE NEGATIVE mg/dL   Nitrite NEGATIVE NEGATIVE   Leukocytes,Ua NEGATIVE NEGATIVE   RBC / HPF 0-5 0 - 5 RBC/hpf   WBC, UA 0-5 0 - 5 WBC/hpf   Bacteria, UA NONE EEN NONE EEN   quamous Epithelial / HPF NONE EEN 0 - 5 /HPF   Mucus PREENT    Hyaline Casts, UA PREENT     Comment: Performed at [facility], [address] [place], [place] [address]  Hepatic function panel     tatus: None   Collection Time: 01/04/23  9:16  AM  Result Value Ref Range   Total Protein 6.9 6.0 - 8.5 g/dL   Albumin 4.6 3.9 - 4.9 g/dL   Bilirubin Total 0.6 0.0 - 1.2 mg/dL   Bilirubin, Direct 2.95 0.00 - 0.40 mg/dL   Alkaline Phosphatase 64 44 - 121 IU/L   AT 24 0 - 40 IU/L   ALT 19 0 - 44 IU/L  Lipid panel     tatus: None   Collection Time: 01/04/23  9:16 AM  Result Value Ref Range   Cholesterol, Total 140 100 - 199 mg/dL   Triglycerides 67 0 - 149 mg/dL   HDL 54 >62 mg/dL   VLDL Cholesterol Cal 14 5 - 40 mg/dL   LDL Chol Calc (NIH) 72 0 - 99 mg/dL   Chol/HDL Ratio 2.6 0.0 - 5.0 ratio    Comment:                                   T. Chol/HDL Ratio                                             Men  Women                               1/2 Avg.Risk  3.4    3.3                                   Avg.Risk  5.0    4.4                                2X Avg.Risk  9.6    7.1                                3X Avg.Risk 23.4   11.0   TH     tatus: None   Collection Time: 01/04/23  9:16 AM  Result Value Ref Range   TSH 1.810 0.450 - 4.500 uIU/mL      Assessment & Plan:  As per problem list  Problem List Items Addressed This Visit       Cardiovascular and Mediastinum   HTN (hypertension)   Relevant Medications   atorvastatin (LIPITOR) 10 MG tablet     Endocrine   Hyperthyroidism - Primary   Relevant Orders   CBC With Diff/Platelet   Lipid panel   TSH     Other   Mixed hyperlipidemia   Relevant Medications   atorvastatin (LIPITOR) 10 MG tablet   Other Relevant Orders   Comprehensive metabolic panel   Lipid panel   Other Visit Diagnoses     Benign prostatic hyperplasia without lower urinary tract symptoms           Return in about 3 months (around 04/19/2023) for awv with labs prior.   Total time spent: 25 minutes  Luna Fuse, MD  01/17/2023   This document may have been prepared by [facility] Voice Recognition software and as such may include unintentional dictation errors.

## 2023-01-21 ENCOUNTER — Other Ambulatory Visit (HOSPITAL_COMMUNITY): Payer: Self-pay | Admitting: Psychiatry

## 2023-02-02 ENCOUNTER — Telehealth (HOSPITAL_COMMUNITY): Payer: Medicare HMO | Admitting: Psychiatry

## 2023-02-17 ENCOUNTER — Encounter (HOSPITAL_COMMUNITY): Payer: Self-pay | Admitting: Psychiatry

## 2023-02-17 ENCOUNTER — Other Ambulatory Visit: Payer: Self-pay

## 2023-02-17 ENCOUNTER — Ambulatory Visit (HOSPITAL_BASED_OUTPATIENT_CLINIC_OR_DEPARTMENT_OTHER): Payer: 59 | Admitting: Psychiatry

## 2023-02-17 VITALS — BP 132/86 | HR 67 | Ht 76.0 in | Wt 168.0 lb

## 2023-02-17 DIAGNOSIS — F250 Schizoaffective disorder, bipolar type: Secondary | ICD-10-CM

## 2023-02-17 DIAGNOSIS — R4183 Borderline intellectual functioning: Secondary | ICD-10-CM

## 2023-02-17 MED ORDER — OLANZAPINE 5 MG PO TABS
5.0000 mg | ORAL_TABLET | Freq: Every day | ORAL | 0 refills | Status: DC
Start: 2023-02-17 — End: 2023-11-01

## 2023-02-17 MED ORDER — OLANZAPINE 2.5 MG PO TABS: 2.5000 mg | ORAL_TABLET | Freq: Every morning | ORAL | 0 refills | Status: DC

## 2023-02-17 MED ORDER — TRAZODONE HCL 100 MG PO TABS
100.0000 mg | ORAL_TABLET | Freq: Every day | ORAL | 0 refills | Status: DC
Start: 2023-02-17 — End: 2023-11-01

## 2023-02-17 NOTE — Progress Notes (Addendum)
BH MD/PA/NP OP Progress Note  Patient location; office Provider location; office  Total time spent 25 minutes face-to-face for this encounter.   02/17/2023 3:35 PM Ian Cook  MRN:  [id number]  Chief Complaint:  Chief Complaint  Patient presents with   Follow-up   HPI: Patient came in for his follow-up appointment.  This is in person visit.  He has been doing better and is stable on current medication.  He is not taking olanzapine 2.5 mg in the morning and 5 mg at bedtime.  He recalled no more episodes of running away or walking out from the group home.  Sometime he does not like the rules and regulations that he is consistent and following them.  He sleeps good.  He is eating okay.  He denies any agitation or any anger.  Sometimes he talk to himself and his thought processes labile but denies any hallucination, paranoia or any suicidal thoughts.  He has no tremor or shakes or any EPS.  Recently had a visit with his primary care and there were no changes.  He has no tremor or shakes or any EPS.  He is not taking Cogentin.  Patient denies any aggression, violence, suicidal thoughts.  Staff did not mention any concern about the patient's behavior  Visit Diagnosis:    ICD-10-CM   1. 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 Multiple inpatient and last inpatient in May 2016. Given Haldol as needed for agitation in group home. H/O taking Navane and Latuda.  No h/o suicidal attempt and self abusive behavior.   Past Medical History:  Past Medical History:  Diagnosis Date   Anxiety    Glaucoma    HTN (hypertension) 11/01/2014   Hypertension    Prostate cancer  (HCC)    Schizo-affective schizophrenia (HCC)    Schizoaffective disorder, manic type (HCC) 10/30/2014   Tobacco use disorder 11/01/2014    Past Surgical History:  Procedure Laterality Date   PELVIC LYMPH NODE DISSECTION Bilateral 08/02/2022   Procedure: PELVIC LYMPH NODE DISSECTION;  Surgeon: Vanna Scotland, MD;  Location: [facility] ORS;  Service: Urology;  Laterality: Bilateral;   ROBOT ASSISTED LAPAROSCOPIC RADICAL PROSTATECTOMY N/A 08/02/2022   Procedure: XI ROBOTIC ASSISTED LAPAROSCOPIC RADICAL PROSTATECTOMY;  Surgeon: Vanna Scotland, MD;  Location: [facility] OR[facility];  Service: Urology;  Laterality: N/A;   TONSILLECTOMY      Family Psychiatric History: Reviewed  Family History:  Family History  Problem Relation Age of Onset   Aneurysm Mother    Depression Mother    CAD Father    Kidney cancer Neg Hx    Prostate cancer Neg Hx     Social History:  Social History   Socioeconomic History   Marital status: Married    Spouse name: Not on file   Number of children: Not on file   Years of education: Not on file   Highest education level: Not on file  Occupational History   Not on file  Tobacco Use   Smoking status: Every Day    Current packs/day: 0.75    Average packs/day: 0.8 packs/day for 39.0 years (29.3 ttl pk-yrs)    Types:  Cigarettes    Passive exposure: Current   Smokeless tobacco: Never  Vaping Use   Vaping status: Never Used  Substance and Sexual Activity   Alcohol use: No    Alcohol/week: 0.0 standard drinks of alcohol   Drug use: No   Sexual activity: Never  Other Topics Concern   Not on file  Social History Narrative   From [facility].   Social Determinants of Health   Financial Resource Strain: Not on file  Food Insecurity: Not on file  Transportation Needs: Not on file  Physical Activity: Not on file  Stress: Not on file  Social Connections: Not on file    Allergies: No Known Allergies  Metabolic Disorder Labs: Lab Results  Component Value Date    HGBA1C 6.7 (H) 03/28/2018   No results found for: "PROLACTIN" Lab Results  Component Value Date   CHOL 140 01/04/2023   TRIG 67 01/04/2023   HDL 54 01/04/2023   CHOLHDL 2.6 01/04/2023   VLDL 11 11/01/2014   LDLCALC 72 01/04/2023   LDLCALC 75 11/01/2014   Lab Results  Component Value Date   TSH 1.810 01/04/2023   TSH 0.553 12/13/2016    Therapeutic Level Labs: No results found for: "LITHIUM" No results found for: "VALPROATE" No results found for: "CBMZ"  Current Medications: Current Outpatient Medications  Medication Sig Dispense Refill   AMBULATORY NON FORMULARY MEDICATION ADULT PULL UPS-due to urinary leakage 120 each 12   amLODipine-benazepril (LOTREL) 10-40 MG capsule Take 1 capsule by mouth daily. 90 capsule 0   atorvastatin (LIPITOR) 10 MG tablet Take 1 tablet (10 mg total) by mouth every evening. 90 tablet 0   benztropine (COGENTIN) 0.5 MG tablet Take 0.5 mg by mouth daily.     brimonidine (ALPHAGAN) 0.2 % ophthalmic solution Place 1 drop into the right eye daily.     dorzolamide-timolol (COSOPT) 2-0.5 % ophthalmic solution Place 1 drop into the right eye 2 (two) times daily.     methimazole (TAPAZOLE) 5 MG tablet Take 2.5 mg by mouth every morning.     OLANZapine (ZYPREXA) 2.5 MG tablet Take 1 tablet (2.5 mg total) by mouth every morning. 30 tablet 1   OLANZapine (ZYPREXA) 5 MG tablet Take 1 tablet (5 mg total) by mouth at bedtime. 90 tablet 1   ROCKLATAN 0.02-0.005 % SOLN Place 1 drop into both eyes at bedtime.     tamsulosin (FLOMAX) 0.4 MG CAPS capsule Take 0.4 mg by mouth daily.     traZODone (DESYREL) 100 MG tablet Take 1 tablet (100 mg total) by mouth at bedtime. 90 tablet 1   docusate sodium (COLACE) 100 MG capsule Take 1 capsule (100 mg total) by mouth 2 (two) times daily. (Patient not taking: Reported on 02/17/2023) 10 capsule 0   latanoprost (XALATAN) 0.005 % ophthalmic solution Place 1 drop into the right eye at bedtime. (Patient not taking: Reported on  11/28/2022)     oxybutynin (DITROPAN) 5 MG tablet Take 1 tablet (5 mg total) by mouth every 8 (eight) hours as needed for bladder spasms. (Patient not taking: Reported on 11/28/2022) 30 tablet 0   No current facility-administered medications for this visit.     Musculoskeletal: Strength & Muscle Tone: within normal limits Gait & Station: normal Patient leans: N/A  Psychiatric Specialty Exam: Review of Systems  Blood pressure 132/86, pulse 67, height 6\' 4"  (1.93 m), weight 168 lb (76.2 kg).Body mass index is 20.45 kg/m.  General Appearance: Fairly Groomed  Eye Contact:  Fair  Speech:  Slow  Volume:  Decreased  Mood:  Euthymic  Affect:  Labile  Thought Process:  Descriptions of Associations: Circumstantial  Orientation:  Full (Time, Place, and Person)  Thought Content: Sometimes talk to himself but denies any paranoia, hallucination or delusions  Suicidal Thoughts:  No  Homicidal Thoughts:  No  Memory:  Immediate;   Fair Recent;   Fair Remote;   Fair  Judgement:  Fair  Insight:  Shallow  Psychomotor Activity:  Decreased  Concentration:  Concentration: Fair and Attention Span: Fair  Recall:  [employer] of Knowledge: Fair  Language: Fair  Akathisia:  No  Handed:  Right  AIMS (if indicated): not done  Assets:  Communication Skills Desire for Improvement Housing  ADL's:  Intact  Cognition: Impaired,  Mild  Sleep:  Good   Screenings: AIMS    Flowsheet Row Admission (Discharged) from 10/30/2014 in [facility]  AIMS Total Score 0      AUDIT    Flowsheet Row Admission (Discharged) from 10/30/2014 in [facility] IN[facility] BE[facility]AV[facility]ICINE  Alcohol Use Disorder Identification Test Final Score (AUDIT) 0      Flowsheet Row ED from 11/28/2022 in [facility] Department at [facility] Admission (Discharged) from 08/02/2022 in [facility] Video [facility]  C-SSRS RISK CATEGORY No Risk No Risk No Risk        Assessment and Plan: Reviewed notes from primary care.  Discontinue Cogentin as patient is no longer taking it.  He is feeling better with the olanzapine 2.5 mg in the morning and 5 mg at bedtime.  He also taking trazodone at bedtime.  Discussed medication side effects and benefits.  He has no tremor or shakes or any EPS.  We will continue current medication.  Follow up in 3 months  Collaboration of Care: Collaboration of Care: Other provider involved in patient's care AEB notes are available in epic to review  Patient/Guardian was advised Release of Information must be obtained prior to any record release in order to collaborate their care with an outside provider. Patient/Guardian was advised if they have not already done so to contact the registration department to sign all necessary forms in order for [place] to release information regarding their care.   Consent: Patient/Guardian gives verbal consent for treatment and assignment of benefits for services provided during this visit. Patient/Guardian expressed understanding and agreed to proceed.    Cleotis Nipper, MD 02/17/2023, 3:35 PM

## 2023-02-20 ENCOUNTER — Other Ambulatory Visit (HOSPITAL_COMMUNITY): Payer: Self-pay | Admitting: Psychiatry

## 2023-02-21 NOTE — Telephone Encounter (Signed)
Patient not taking Cogentin, refill request denied.

## 2023-02-24 DIAGNOSIS — H2589 Other age-related cataract: Secondary | ICD-10-CM | POA: Diagnosis not present

## 2023-02-24 DIAGNOSIS — H2511 Age-related nuclear cataract, right eye: Secondary | ICD-10-CM | POA: Diagnosis not present

## 2023-02-24 DIAGNOSIS — H401113 Primary open-angle glaucoma, right eye, severe stage: Secondary | ICD-10-CM | POA: Diagnosis not present

## 2023-02-25 DIAGNOSIS — I10 Essential (primary) hypertension: Secondary | ICD-10-CM | POA: Diagnosis not present

## 2023-03-01 DIAGNOSIS — E042 Nontoxic multinodular goiter: Secondary | ICD-10-CM | POA: Diagnosis not present

## 2023-03-11 ENCOUNTER — Other Ambulatory Visit: Payer: Self-pay | Admitting: *Deleted

## 2023-03-11 ENCOUNTER — Other Ambulatory Visit: Payer: 59

## 2023-03-11 DIAGNOSIS — R972 Elevated prostate specific antigen [PSA]: Secondary | ICD-10-CM

## 2023-03-12 LAB — PSA: Prostate Specific Ag, Serum: <0.1 ng/mL (ref 0.0–4.0)

## 2023-03-16 ENCOUNTER — Ambulatory Visit: Payer: 59 | Admitting: Urology

## 2023-03-24 DIAGNOSIS — E0590 Thyrotoxicosis, unspecified without thyrotoxic crisis or storm: Secondary | ICD-10-CM | POA: Diagnosis not present

## 2023-03-24 DIAGNOSIS — E042 Nontoxic multinodular goiter: Secondary | ICD-10-CM | POA: Diagnosis not present

## 2023-04-06 DIAGNOSIS — Z720 Tobacco use: Secondary | ICD-10-CM | POA: Diagnosis not present

## 2023-04-06 DIAGNOSIS — I10 Essential (primary) hypertension: Secondary | ICD-10-CM | POA: Diagnosis not present

## 2023-04-12 ENCOUNTER — Ambulatory Visit: Payer: 59 | Admitting: Urology

## 2023-04-13 ENCOUNTER — Encounter: Payer: Self-pay | Admitting: Urology

## 2023-04-14 ENCOUNTER — Ambulatory Visit (HOSPITAL_COMMUNITY): Payer: Medicare HMO | Admitting: Psychiatry

## 2023-04-15 ENCOUNTER — Telehealth (HOSPITAL_COMMUNITY): Payer: Medicare HMO | Admitting: Psychiatry

## 2023-04-16 ENCOUNTER — Other Ambulatory Visit (HOSPITAL_COMMUNITY): Payer: Self-pay | Admitting: Psychiatry

## 2023-04-16 ENCOUNTER — Other Ambulatory Visit: Payer: Self-pay | Admitting: Internal Medicine

## 2023-04-16 DIAGNOSIS — E782 Mixed hyperlipidemia: Secondary | ICD-10-CM

## 2023-04-16 DIAGNOSIS — I10 Essential (primary) hypertension: Secondary | ICD-10-CM

## 2023-04-25 ENCOUNTER — Ambulatory Visit (INDEPENDENT_AMBULATORY_CARE_PROVIDER_SITE_OTHER): Payer: 59 | Admitting: Internal Medicine

## 2023-04-25 ENCOUNTER — Encounter: Payer: Self-pay | Admitting: Internal Medicine

## 2023-04-25 VITALS — BP 137/88 | HR 59 | Ht 74.0 in | Wt 172.2 lb

## 2023-04-25 DIAGNOSIS — Z1331 Encounter for screening for depression: Secondary | ICD-10-CM

## 2023-04-25 DIAGNOSIS — E0590 Thyrotoxicosis, unspecified without thyrotoxic crisis or storm: Secondary | ICD-10-CM

## 2023-04-25 DIAGNOSIS — Z0001 Encounter for general adult medical examination with abnormal findings: Secondary | ICD-10-CM

## 2023-04-25 DIAGNOSIS — I10 Essential (primary) hypertension: Secondary | ICD-10-CM | POA: Diagnosis not present

## 2023-04-25 DIAGNOSIS — N400 Enlarged prostate without lower urinary tract symptoms: Secondary | ICD-10-CM

## 2023-04-25 DIAGNOSIS — E782 Mixed hyperlipidemia: Secondary | ICD-10-CM

## 2023-04-25 MED ORDER — ATORVASTATIN CALCIUM 10 MG PO TABS: 10.0000 mg | ORAL_TABLET | Freq: Every evening | ORAL | 0 refills | Status: DC

## 2023-04-25 MED ORDER — AMLODIPINE BESY-BENAZEPRIL HCL 10-40 MG PO CAPS: 1.0000 | ORAL_CAPSULE | Freq: Every day | ORAL | 0 refills | Status: DC

## 2023-04-25 NOTE — Progress Notes (Signed)
Established Patient Office Visit  Subjective:  Patient ID: Ian Cook, male    DOB: 1961-02-02  Age: 62 y.o. MRN: [id number]  No chief complaint on file.   No new complaints, here for CPE, lab review and medication refills.Labs reviewed and notable for normal psa, did not have other labs ordered.   No other concerns at this time.   Past Medical History:  Diagnosis Date   Anxiety    Glaucoma    HTN (hypertension) 11/01/2014   Hypertension    Prostate cancer (HCC)    Schizo-affective schizophrenia (HCC)    Schizoaffective disorder, manic type (HCC) 10/30/2014   Tobacco use disorder 11/01/2014    Past Surgical History:  Procedure Laterality Date   PELVIC LYMPH NODE DISSECTION Bilateral 08/02/2022   Procedure: PELVIC LYMPH NODE DISSECTION;  Surgeon: Vanna Scotland, MD;  Location: [facility] ORS;  Service: Urology;  Laterality: Bilateral;   ROBOT ASSISTED LAPAROSCOPIC RADICAL PROSTATECTOMY N/A 08/02/2022   Procedure: XI ROBOTIC ASSISTED LAPAROSCOPIC RADICAL PROSTATECTOMY;  Surgeon: Vanna Scotland, MD;  Location: [facility] ORS;  Service: Urology;  Laterality: N/A;   TONSILLECTOMY      Social History   Socioeconomic History   Marital status: Married    Spouse name: Not on file   Number of children: Not on file   Years of education: Not on file   Highest education level: Not on file  Occupational History   Not on file  Tobacco Use   Smoking status: Every Day    Current packs/day: 0.75    Average packs/day: 0.8 packs/day for 39.0 years (29.3 ttl pk-yrs)    Types: Cigarettes    Passive exposure: Current   Smokeless tobacco: Never  Vaping Use   Vaping status: Never Used  Substance and Sexual Activity   Alcohol use: No    Alcohol/week: 0.0 standard drinks of alcohol   Drug use: No   Sexual activity: Never  Other Topics Concern   Not on file  Social History Narrative   From [facility].   Social Determinants of Health   Financial Resource Strain: Not on file  Food  Insecurity: Not on file  Transportation Needs: Not on file  Physical Activity: Not on file  Stress: Not on file  Social Connections: Not on file  Intimate Partner Violence: Not on file    Family History  Problem Relation Age of Onset   Aneurysm Mother    Depression Mother    CAD Father    Kidney cancer Neg Hx    Prostate cancer Neg Hx     No Known Allergies  Outpatient Medications Prior to Visit  Medication Sig   AMBULATORY NON FORMULARY MEDICATION ADULT PULL UPS-due to urinary leakage   amLODipine-benazepril (LOTREL) 10-40 MG capsule TAKE 1 CAPSULE BY MOUTH ONCE DAILY   atorvastatin (LIPITOR) 10 MG tablet TAKE 1 TABLET BY MOUTH EVERY EVENING   brimonidine (ALPHAGAN) 0.2 % ophthalmic solution Place 1 drop into the right eye daily.   docusate sodium (COLACE) 100 MG capsule Take 1 capsule (100 mg total) by mouth 2 (two) times daily. (Patient not taking: Reported on 02/17/2023)   dorzolamide-timolol (COSOPT) 2-0.5 % ophthalmic solution Place 1 drop into the right eye 2 (two) times daily.   latanoprost (XALATAN) 0.005 % ophthalmic solution Place 1 drop into the right eye at bedtime. (Patient not taking: Reported on 11/28/2022)   methimazole (TAPAZOLE) 5 MG tablet Take 2.5 mg by mouth every morning.   OLANZapine (ZYPREXA) 2.5 MG tablet Take 1  tablet (2.5 mg total) by mouth every morning.   OLANZapine (ZYPREXA) 5 MG tablet Take 1 tablet (5 mg total) by mouth at bedtime.   oxybutynin (DITROPAN) 5 MG tablet Take 1 tablet (5 mg total) by mouth every 8 (eight) hours as needed for bladder spasms. (Patient not taking: Reported on 11/28/2022)   ROCKLATAN 0.02-0.005 % SOLN Place 1 drop into both eyes at bedtime.   tamsulosin (FLOMAX) 0.4 MG CAPS capsule Take 0.4 mg by mouth daily.   traZODone (DESYREL) 100 MG tablet Take 1 tablet (100 mg total) by mouth at bedtime.   No facility-administered medications prior to visit.    Review of Systems  Constitutional:  Negative for weight loss (gained 10  lbs).  Respiratory:  Negative for shortness of breath.   Cardiovascular:  Negative for chest pain and palpitations.       Objective:   BP 137/88   Pulse (!) 59   Ht 6\' 2"  (1.88 m)   Wt 172 lb 3.2 oz (78.1 kg)   SpO2 97%   BMI 22.11 kg/m   Vitals:   04/25/23 1435  BP: 137/88  Pulse: (!) 59  Height: 6\' 2"  (1.88 m)  Weight: 172 lb 3.2 oz (78.1 kg)  SpO2: 97%  BMI (Calculated): 22.1    Physical Exam Vitals reviewed.  Constitutional:      Appearance: Normal appearance.  HENT:     Head: Normocephalic.     Left Ear: Ear canal normal. There is no impacted cerumen.     Nose: Nose normal.     Mouth/Throat:     Mouth: Mucous membranes are moist.     Pharynx: No posterior oropharyngeal erythema.  Eyes:     Extraocular Movements: Extraocular movements intact.     Pupils: Pupils are equal, round, and reactive to light.  Cardiovascular:     Rate and Rhythm: Regular rhythm.     Chest Wall: PMI is not displaced.     Pulses: Normal pulses.     Heart sounds: Normal heart sounds. No murmur heard. Pulmonary:     Effort: Pulmonary effort is normal.     Breath sounds: Normal air entry. No rhonchi or rales.  Abdominal:     General: Abdomen is flat. Bowel sounds are normal. There is no distension.     Palpations: Abdomen is soft. There is no hepatomegaly, splenomegaly or mass.     Tenderness: There is no abdominal tenderness.  Musculoskeletal:        General: Normal range of motion.     Cervical back: Normal range of motion and neck supple.     Right lower leg: No edema.     Left lower leg: No edema.  Skin:    General: Skin is warm and dry.  Neurological:     General: No focal deficit present.     Mental Status: He is alert and oriented to person, place, and time.     Cranial Nerves: No cranial nerve deficit.     Motor: No weakness.  Psychiatric:        Mood and Affect: Mood normal.        Behavior: Behavior normal.      No results found for any visits on  04/25/23.  Recent Results (from the past 2160 hour(Alishah Schulte))  PSA     Status: None   Collection Time: 03/11/23 11:56 AM  Result Value Ref Range   Prostate Specific Ag, Serum <0.1 0.0 - 4.0 ng/mL    Comment: Roche  ECLIA methodology. According to the American Urological Association, Serum PSA should decrease and remain at undetectable levels after radical prostatectomy. The AUA defines biochemical recurrence as an initial PSA value 0.2 ng/mL or greater followed by a subsequent confirmatory PSA value 0.2 ng/mL or greater. Values obtained with different assay methods or kits cannot be used interchangeably. Results cannot be interpreted as absolute evidence of the presence or absence of malignant disease.       Assessment & Plan:  As per problem list  Problem List Items Addressed This Visit       Cardiovascular and Mediastinum   HTN (hypertension)     Endocrine   Hyperthyroidism - Primary     Other   Mixed hyperlipidemia   Other Visit Diagnoses     Benign prostatic hyperplasia without lower urinary tract symptoms           Return in about 3 months (around 07/26/2023) for fu with labs prior.   Total time spent: 30 minutes  Luna Fuse, MD  04/25/2023   This document may have been prepared by [facility] Voice Recognition software and as such may include unintentional dictation errors.

## 2023-05-07 DIAGNOSIS — I10 Essential (primary) hypertension: Secondary | ICD-10-CM | POA: Diagnosis not present

## 2023-05-07 DIAGNOSIS — Z720 Tobacco use: Secondary | ICD-10-CM | POA: Diagnosis not present

## 2023-05-13 ENCOUNTER — Other Ambulatory Visit (HOSPITAL_COMMUNITY): Payer: Self-pay | Admitting: Psychiatry

## 2023-05-13 DIAGNOSIS — R4183 Borderline intellectual functioning: Secondary | ICD-10-CM

## 2023-05-13 DIAGNOSIS — F250 Schizoaffective disorder, bipolar type: Secondary | ICD-10-CM

## 2023-05-17 ENCOUNTER — Ambulatory Visit (INDEPENDENT_AMBULATORY_CARE_PROVIDER_SITE_OTHER): Payer: 59 | Admitting: Urology

## 2023-05-17 VITALS — BP 135/88 | HR 64 | Ht 74.0 in | Wt 178.0 lb

## 2023-05-17 DIAGNOSIS — Z8546 Personal history of malignant neoplasm of prostate: Secondary | ICD-10-CM

## 2023-05-17 DIAGNOSIS — C61 Malignant neoplasm of prostate: Secondary | ICD-10-CM

## 2023-05-17 DIAGNOSIS — R32 Unspecified urinary incontinence: Secondary | ICD-10-CM

## 2023-05-17 DIAGNOSIS — R972 Elevated prostate specific antigen [PSA]: Secondary | ICD-10-CM

## 2023-05-17 NOTE — Progress Notes (Signed)
05/17/2023 5:13 PM   Ian Cook 1961-05-01 [id number]  Referring provider: Sherron Monday, MD [address] [place],  [place] [address]  Chief Complaint  Patient presents with   Elevated PSA    HPI: 62 year-old male with a personal history of prostate cancer presents today for a six month follow-up (two months overdue).  He underwent a radical prostatectomy on 08/02/2022 with bilateral pelvic lymph node dissection. Surgical pathology was consistent with Gleason 3+4. He did have evidence of non-focal extraprostatic extension, right seminal vesicle invasion, but negative margins and lymph nodes, classified as [id number].    His post operative course was complicated by self removal of his urinary catheter on post-op day 3 or 4 in his group home and it is unclear if the Foley balloon was deflated. He was seen in clinic several days later. At that time, all of his labs were normal. He was not retaining urine was we elected not to replace the catheter.   His most recent PSA on 03/11/23 remains undetectable.  He is doing well overall with no new symptoms or complaints.  PMH: Past Medical History:  Diagnosis Date   Anxiety    Glaucoma    HTN (hypertension) 11/01/2014   Hypertension    Prostate cancer (HCC)    Schizo-affective schizophrenia (HCC)    Schizoaffective disorder, manic type (HCC) 10/30/2014   Tobacco use disorder 11/01/2014    Surgical History: Past Surgical History:  Procedure Laterality Date   PELVIC LYMPH NODE DISSECTION Bilateral 08/02/2022   Procedure: PELVIC LYMPH NODE DISSECTION;  Surgeon: Vanna Scotland, MD;  Location: [facility] ORS;  Service: Urology;  Laterality: Bilateral;   ROBOT ASSISTED LAPAROSCOPIC RADICAL PROSTATECTOMY N/A 08/02/2022   Procedure: XI ROBOTIC ASSISTED LAPAROSCOPIC RADICAL PROSTATECTOMY;  Surgeon: Vanna Scotland, MD;  Location: [facility] ORS;  Service: Urology;  Laterality: N/A;   TONSILLECTOMY      Home Medications:  Allergies as of  05/17/2023   No Known Allergies      Medication List        Accurate as of May 17, 2023  5:13 PM. If you have any questions, ask your nurse or doctor.          STOP taking these medications    docusate sodium 100 MG capsule Commonly known as: COLACE   oxybutynin 5 MG tablet Commonly known as: DITROPAN   tamsulosin 0.4 MG Caps capsule Commonly known as: FLOMAX       TAKE these medications    AMBULATORY NON FORMULARY MEDICATION ADULT PULL UPS-due to urinary leakage   amLODipine-benazepril 10-40 MG capsule Commonly known as: LOTREL Take 1 capsule by mouth daily.   atorvastatin 10 MG tablet Commonly known as: LIPITOR Take 1 tablet (10 mg total) by mouth every evening.   brimonidine 0.2 % ophthalmic solution Commonly known as: ALPHAGAN Place 1 drop into the right eye daily.   dorzolamide-timolol 2-0.5 % ophthalmic solution Commonly known as: COSOPT Place 1 drop into the right eye 2 (two) times daily.   latanoprost 0.005 % ophthalmic solution Commonly known as: XALATAN Place 1 drop into the right eye at bedtime.   methimazole 5 MG tablet Commonly known as: TAPAZOLE Take 2.5 mg by mouth every morning.   OLANZapine 2.5 MG tablet Commonly known as: ZYPREXA Take 1 tablet (2.5 mg total) by mouth every morning.   OLANZapine 5 MG tablet Commonly known as: ZYPREXA Take 1 tablet (5 mg total) by mouth at bedtime.   Rocklatan 0.02-0.005 % Soln Generic drug: Netarsudil-Latanoprost  Place 1 drop into both eyes at bedtime.   traZODone 100 MG tablet Commonly known as: DESYREL Take 1 tablet (100 mg total) by mouth at bedtime.        Family History: Family History  Problem Relation Age of Onset   Aneurysm Mother    Depression Mother    CAD Father    Kidney cancer Neg Hx    Prostate cancer Neg Hx     Social History:  reports that he has been smoking cigarettes. He has a 29.3 pack-year smoking history. He has been exposed to tobacco smoke. He has  never used smokeless tobacco. He reports that he does not drink alcohol and does not use drugs.   Physical Exam: BP 135/88   Pulse 64   Ht 6\' 2"  (1.88 m)   Wt 178 lb (80.7 kg)   BMI 22.85 kg/m   Constitutional:  Alert and oriented, No acute distress. HEENT: [place] AT, moist mucus membranes.  Trachea midline, no masses. Neurologic: Grossly intact, no focal deficits, moving all 4 extremities. Psychiatric: Normal mood and affect.   Assessment & Plan:    1. Prostate cancer  - No evidence of disease. Will continue to check his PSA on a q6 month basis and appointments annually.   2. Urinary incontinence  - Improved, but still wears diapers as a precaution. Not bothersome.  Return in about 1 year (around 05/16/2024) for PSA.   [facility] [address], [address] [place], [place] [address] [phone]

## 2023-05-19 ENCOUNTER — Ambulatory Visit (HOSPITAL_COMMUNITY): Payer: 59 | Admitting: Psychiatry

## 2023-05-27 DIAGNOSIS — I10 Essential (primary) hypertension: Secondary | ICD-10-CM | POA: Diagnosis not present

## 2023-05-27 DIAGNOSIS — Z720 Tobacco use: Secondary | ICD-10-CM | POA: Diagnosis not present

## 2023-06-13 ENCOUNTER — Other Ambulatory Visit (HOSPITAL_COMMUNITY): Payer: Self-pay | Admitting: Psychiatry

## 2023-06-13 ENCOUNTER — Other Ambulatory Visit: Payer: Self-pay | Admitting: Internal Medicine

## 2023-06-13 DIAGNOSIS — F250 Schizoaffective disorder, bipolar type: Secondary | ICD-10-CM

## 2023-06-13 DIAGNOSIS — E782 Mixed hyperlipidemia: Secondary | ICD-10-CM

## 2023-06-13 DIAGNOSIS — R4183 Borderline intellectual functioning: Secondary | ICD-10-CM

## 2023-06-16 ENCOUNTER — Ambulatory Visit (HOSPITAL_COMMUNITY): Payer: 59 | Admitting: Psychiatry

## 2023-06-22 DIAGNOSIS — Z720 Tobacco use: Secondary | ICD-10-CM | POA: Diagnosis not present

## 2023-06-22 DIAGNOSIS — I10 Essential (primary) hypertension: Secondary | ICD-10-CM | POA: Diagnosis not present

## 2023-07-18 DIAGNOSIS — Z720 Tobacco use: Secondary | ICD-10-CM | POA: Diagnosis not present

## 2023-07-18 DIAGNOSIS — I10 Essential (primary) hypertension: Secondary | ICD-10-CM | POA: Diagnosis not present

## 2023-07-20 DIAGNOSIS — E042 Nontoxic multinodular goiter: Secondary | ICD-10-CM | POA: Diagnosis not present

## 2023-07-21 DIAGNOSIS — E042 Nontoxic multinodular goiter: Secondary | ICD-10-CM | POA: Diagnosis not present

## 2023-07-26 ENCOUNTER — Ambulatory Visit: Payer: 59 | Admitting: Internal Medicine

## 2023-08-11 ENCOUNTER — Other Ambulatory Visit: Payer: Self-pay | Admitting: Internal Medicine

## 2023-08-11 ENCOUNTER — Other Ambulatory Visit

## 2023-08-11 DIAGNOSIS — E782 Mixed hyperlipidemia: Secondary | ICD-10-CM | POA: Diagnosis not present

## 2023-08-11 DIAGNOSIS — E0590 Thyrotoxicosis, unspecified without thyrotoxic crisis or storm: Secondary | ICD-10-CM | POA: Diagnosis not present

## 2023-08-12 LAB — COMPREHENSIVE METABOLIC PANEL
ALT: 27 IU/L (ref 0–44)
AST: 28 IU/L (ref 0–40)
Albumin: 4.3 g/dL (ref 3.9–4.9)
Alkaline Phosphatase: 81 IU/L (ref 44–121)
BUN/Creatinine Ratio: 13 (ref 10–24)
BUN: 19 mg/dL (ref 8–27)
Bilirubin Total: 0.2 mg/dL (ref 0.0–1.2)
CO2: 23 mmol/L (ref 20–29)
Calcium: 9.8 mg/dL (ref 8.6–10.2)
Chloride: 105 mmol/L (ref 96–106)
Creatinine, Ser: 1.48 mg/dL — ABNORMAL HIGH (ref 0.76–1.27)
Globulin, Total: 2.7 g/dL (ref 1.5–4.5)
Glucose: 108 mg/dL — ABNORMAL HIGH (ref 70–99)
Potassium: 4.5 mmol/L (ref 3.5–5.2)
Sodium: 145 mmol/L — ABNORMAL HIGH (ref 134–144)
Total Protein: 7 g/dL (ref 6.0–8.5)
eGFR: 53 mL/min/{1.73_m2} — ABNORMAL LOW (ref >59)

## 2023-08-12 LAB — CBC WITH DIFF/PLATELET
Basophils Absolute: 0 10*3/uL (ref 0.0–0.2)
Basos: 0 %
EOS (ABSOLUTE): 0.2 10*3/uL (ref 0.0–0.4)
Eos: 4 %
Hematocrit: 43.3 % (ref 37.5–51.0)
Hemoglobin: 14.2 g/dL (ref 13.0–17.7)
Immature Grans (Abs): 0 10*3/uL (ref 0.0–0.1)
Immature Granulocytes: 0 %
Lymphocytes Absolute: 2.6 10*3/uL (ref 0.7–3.1)
Lymphs: 38 %
MCH: 31.1 pg (ref 26.6–33.0)
MCHC: 32.8 g/dL (ref 31.5–35.7)
MCV: 95 fL (ref 79–97)
Monocytes Absolute: 0.5 10*3/uL (ref 0.1–0.9)
Monocytes: 7 %
Neutrophils Absolute: 3.4 10*3/uL (ref 1.4–7.0)
Neutrophils: 51 %
Platelets: 238 10*3/uL (ref 150–450)
RBC: 4.56 x10E6/uL (ref 4.14–5.80)
RDW: 12.5 % (ref 11.6–15.4)
WBC: 6.8 10*3/uL (ref 3.4–10.8)

## 2023-08-12 LAB — CK: Total CK: 314 U/L (ref 41–331)

## 2023-08-12 LAB — LIPID PANEL
Chol/HDL Ratio: 3.2 ratio (ref 0.0–5.0)
Cholesterol, Total: 126 mg/dL (ref 100–199)
HDL: 39 mg/dL — ABNORMAL LOW (ref >39)
LDL Chol Calc (NIH): 59 mg/dL (ref 0–99)
Triglycerides: 163 mg/dL — ABNORMAL HIGH (ref 0–149)
VLDL Cholesterol Cal: 28 mg/dL (ref 5–40)

## 2023-08-12 LAB — TSH: TSH: 1.1 u[IU]/mL (ref 0.450–4.500)

## 2023-08-16 ENCOUNTER — Encounter: Payer: Self-pay | Admitting: Internal Medicine

## 2023-08-16 ENCOUNTER — Ambulatory Visit (INDEPENDENT_AMBULATORY_CARE_PROVIDER_SITE_OTHER): Payer: 59 | Admitting: Internal Medicine

## 2023-08-16 VITALS — BP 127/82 | HR 65 | Ht 76.0 in | Wt 179.0 lb

## 2023-08-16 DIAGNOSIS — E0590 Thyrotoxicosis, unspecified without thyrotoxic crisis or storm: Secondary | ICD-10-CM

## 2023-08-16 DIAGNOSIS — I10 Essential (primary) hypertension: Secondary | ICD-10-CM | POA: Diagnosis not present

## 2023-08-16 DIAGNOSIS — E870 Hyperosmolality and hypernatremia: Secondary | ICD-10-CM

## 2023-08-16 DIAGNOSIS — E782 Mixed hyperlipidemia: Secondary | ICD-10-CM | POA: Diagnosis not present

## 2023-08-16 DIAGNOSIS — N289 Disorder of kidney and ureter, unspecified: Secondary | ICD-10-CM

## 2023-08-16 LAB — POCT URINALYSIS DIPSTICK
Bilirubin, UA: NEGATIVE
Glucose, UA: NEGATIVE
Ketones, UA: NEGATIVE
Leukocytes, UA: NEGATIVE
Nitrite, UA: NEGATIVE
Protein, UA: NEGATIVE
Spec Grav, UA: 1.015 (ref 1.010–1.025)
Urobilinogen, UA: 0.2 U/dL
pH, UA: 6 (ref 5.0–8.0)

## 2023-08-16 LAB — POC CREATINE & ALBUMIN,URINE
Albumin/Creatinine Ratio, Urine, POC: <30
Creatinine, POC: 50 mg/dL
Microalbumin Ur, POC: 10 mg/L

## 2023-08-16 MED ORDER — AMLODIPINE BESY-BENAZEPRIL HCL 10-40 MG PO CAPS: 1.0000 | ORAL_CAPSULE | Freq: Every day | ORAL | 2 refills | Status: DC

## 2023-08-16 MED ORDER — ATORVASTATIN CALCIUM 10 MG PO TABS: 10.0000 mg | ORAL_TABLET | Freq: Every evening | ORAL | 2 refills | Status: DC

## 2023-08-16 NOTE — Progress Notes (Signed)
 Established Patient Office Visit  Subjective:  Patient ID: Ian Cook, male    DOB: 12/28/1960  Age: 63 y.o. MRN: [id number]  Chief Complaint  Patient presents with   Follow-up    3 month follow up with labs prior    No new complaints, here for lab review and medication refills. Labs reviewed and notable for elevated sodium and deterioration in renal function. TC and LDL remain well controlled although trigs are higher.    No other concerns at this time.   Past Medical History:  Diagnosis Date   Anxiety    Glaucoma    HTN (hypertension) 11/01/2014   Hypertension    Prostate cancer (HCC)    Schizo-affective schizophrenia (HCC)    Schizoaffective disorder, manic type (HCC) 10/30/2014   Tobacco use disorder 11/01/2014    Past Surgical History:  Procedure Laterality Date   PELVIC LYMPH NODE DISSECTION Bilateral 08/02/2022   Procedure: PELVIC LYMPH NODE DISSECTION;  Surgeon: Vanna Scotland, MD;  Location: [facility];  Service: Urology;  Laterality: Bilateral;   ROBOT ASSISTED LAPAROSCOPIC RADICAL PROSTATECTOMY N/A 08/02/2022   Procedure: XI ROBOTIC ASSISTED LAPAROSCOPIC RADICAL PROSTATECTOMY;  Surgeon: Vanna Scotland, MD;  Location: [facility];  Service: Urology;  Laterality: N/A;   TONSILLECTOMY      Social History   Socioeconomic History   Marital status: Married    Spouse name: Not on file   Number of children: Not on file   Years of education: Not on file   Highest education level: Not on file  Occupational History   Not on file  Tobacco Use   Smoking status: Every Day    Current packs/day: 0.75    Average packs/day: 0.8 packs/day for 39.0 years (29.3 ttl pk-yrs)    Types: Cigarettes    Passive exposure: Current   Smokeless tobacco: Never  Vaping Use   Vaping status: Never Used  Substance and Sexual Activity   Alcohol use: No    Alcohol/week: 0.0 standard drinks of alcohol   Drug use: No   Sexual activity: Never  Other Topics Concern   Not on file   Social History Narrative   From Family care home.   Social Drivers of Corporate investment banker Strain: Not on file  Food Insecurity: Not on file  Transportation Needs: Not on file  Physical Activity: Not on file  Stress: Not on file  Social Connections: Not on file  Intimate Partner Violence: Not on file    Family History  Problem Relation Age of Onset   Aneurysm Mother    Depression Mother    CAD Father    Kidney cancer Neg Hx    Prostate cancer Neg Hx     No Known Allergies  Outpatient Medications Prior to Visit  Medication Sig   AMBULATORY NON FORMULARY MEDICATION ADULT PULL UPS-due to urinary leakage   amLODipine-benazepril (LOTREL) 10-40 MG capsule Take 1 capsule by mouth daily.   atorvastatin (LIPITOR) 10 MG tablet TAKE 1 TABLET BY MOUTH EVERY EVENING   brimonidine (ALPHAGAN) 0.2 % ophthalmic solution Place 1 drop into the right eye daily.   dorzolamide-timolol (COSOPT) 2-0.5 % ophthalmic solution Place 1 drop into the right eye 2 (two) times daily.   latanoprost (XALATAN) 0.005 % ophthalmic solution Place 1 drop into the right eye at bedtime.   methimazole (TAPAZOLE) 5 MG tablet Take 2.5 mg by mouth every morning.   OLANZapine (ZYPREXA) 2.5 MG tablet Take 1 tablet (2.5 mg total) by mouth every morning.  OLANZapine (ZYPREXA) 5 MG tablet Take 1 tablet (5 mg total) by mouth at bedtime.   ROCKLATAN 0.02-0.005 % SOLN Place 1 drop into both eyes at bedtime.   traZODone (DESYREL) 100 MG tablet Take 1 tablet (100 mg total) by mouth at bedtime.   No facility-administered medications prior to visit.    Review of Systems  Constitutional: Negative.   HENT: Negative.    Eyes: Negative.   Respiratory: Negative.    Cardiovascular: Negative.   Gastrointestinal: Negative.   Genitourinary: Negative.   Skin: Negative.   Neurological: Negative.   Endo/Heme/Allergies: Negative.        Objective:   BP 127/82   Pulse 65   Ht 6\' 4"  (1.93 m)   Wt 179 lb (81.2 kg)    SpO2 98%   BMI 21.79 kg/m   Vitals:   08/16/23 1134  BP: 127/82  Pulse: 65  Height: 6\' 4"  (1.93 m)  Weight: 179 lb (81.2 kg)  SpO2: 98%  BMI (Calculated): 21.8    Physical Exam Vitals reviewed.  Constitutional:      Appearance: Normal appearance.  HENT:     Head: Normocephalic.     Left Ear: Ear canal normal. There is no impacted cerumen.     Nose: Nose normal.     Mouth/Throat:     Mouth: Mucous membranes are moist.     Pharynx: No posterior oropharyngeal erythema.  Eyes:     Extraocular Movements: Extraocular movements intact.     Pupils: Pupils are equal, round, and reactive to light.  Cardiovascular:     Rate and Rhythm: Regular rhythm.     Chest Wall: PMI is not displaced.     Pulses: Normal pulses.     Heart sounds: Normal heart sounds. No murmur heard. Pulmonary:     Effort: Pulmonary effort is normal.     Breath sounds: Normal air entry. No rhonchi or rales.  Abdominal:     General: Abdomen is flat. Bowel sounds are normal. There is no distension.     Palpations: Abdomen is soft. There is no hepatomegaly, splenomegaly or mass.     Tenderness: There is no abdominal tenderness.  Musculoskeletal:        General: Normal range of motion.     Cervical back: Normal range of motion and neck supple.     Right lower leg: No edema.     Left lower leg: No edema.  Skin:    General: Skin is warm and dry.  Neurological:     General: No focal deficit present.     Mental Status: He is alert and oriented to person, place, and time.     Cranial Nerves: No cranial nerve deficit.     Motor: No weakness.  Psychiatric:        Mood and Affect: Mood normal.        Behavior: Behavior normal.      No results found for any visits on 08/16/23.  Recent Results (from the past 2160 hours)  CBC With Diff/Platelet     Status: None   Collection Time: 08/11/23  3:26 PM  Result Value Ref Range   WBC 6.8 3.4 - 10.8 x10E3/uL   RBC 4.56 4.14 - 5.80 x10E6/uL   Hemoglobin 14.2 13.0 -  17.7 g/dL   Hematocrit 21.3 08.6 - 51.0 %   MCV 95 79 - 97 fL   MCH 31.1 26.6 - 33.0 pg   MCHC 32.8 31.5 - 35.7 g/dL   RDW  12.5 11.6 - 15.4 %   Platelets 238 150 - 450 x10E3/uL   Neutrophils 51 Not Estab. %   Lymphs 38 Not Estab. %   Monocytes 7 Not Estab. %   Eos 4 Not Estab. %   Basos 0 Not Estab. %   Neutrophils Absolute 3.4 1.4 - 7.0 x10E3/uL   Lymphocytes Absolute 2.6 0.7 - 3.1 x10E3/uL   Monocytes Absolute 0.5 0.1 - 0.9 x10E3/uL   EOS (ABSOLUTE) 0.2 0.0 - 0.4 x10E3/uL   Basophils Absolute 0.0 0.0 - 0.2 x10E3/uL   Immature Granulocytes 0 Not Estab. %   Immature Grans (Abs) 0.0 0.0 - 0.1 x10E3/uL  Comprehensive metabolic panel     Status: Abnormal   Collection Time: 08/11/23  3:26 PM  Result Value Ref Range   Glucose 108 (H) 70 - 99 mg/dL   BUN 19 8 - 27 mg/dL   Creatinine, Ser 1.61 (H) 0.76 - 1.27 mg/dL   eGFR 53 (L) >09 UE/AVW/0.98   BUN/Creatinine Ratio 13 10 - 24   Sodium 145 (H) 134 - 144 mmol/L   Potassium 4.5 3.5 - 5.2 mmol/L   Chloride 105 96 - 106 mmol/L   CO2 23 20 - 29 mmol/L   Calcium 9.8 8.6 - 10.2 mg/dL   Total Protein 7.0 6.0 - 8.5 g/dL   Albumin 4.3 3.9 - 4.9 g/dL   Globulin, Total 2.7 1.5 - 4.5 g/dL   Bilirubin Total 0.2 0.0 - 1.2 mg/dL   Alkaline Phosphatase 81 44 - 121 IU/L   AST 28 0 - 40 IU/L   ALT 27 0 - 44 IU/L  Lipid panel     Status: Abnormal   Collection Time: 08/11/23  3:26 PM  Result Value Ref Range   Cholesterol, Total 126 100 - 199 mg/dL   Triglycerides 119 (H) 0 - 149 mg/dL   HDL 39 (L) >14 mg/dL   VLDL Cholesterol Cal 28 5 - 40 mg/dL   LDL Chol Calc (NIH) 59 0 - 99 mg/dL   Chol/HDL Ratio 3.2 0.0 - 5.0 ratio    Comment:                                   T. Chol/HDL Ratio                                             Men  Women                               1/2 Avg.Risk  3.4    3.3                                   Avg.Risk  5.0    4.4                                2X Avg.Risk  9.6    7.1                                3X Avg.Risk  23.4   11.0  TSH     Status: None   Collection Time: 08/11/23  3:26 PM  Result Value Ref Range   TSH 1.100 0.450 - 4.500 uIU/mL  CK     Status: None   Collection Time: 08/11/23  3:26 PM  Result Value Ref Range   Total CK 314 41 - 331 U/L      Assessment & Plan:  As per problem list. Increase fluid intake to 1.5-2L Problem List Items Addressed This Visit       Endocrine   Hyperthyroidism - Primary     Other   Mixed hyperlipidemia   Other Visit Diagnoses       Hypernatremia         Renal dysfunction       Relevant Orders   POC CREATINE & ALBUMIN,URINE   POCT Urinalysis Dipstick (81002)       Return in about 6 weeks (around 09/27/2023) for fu with labs prior.   Total time spent: 30 minutes  Luna Fuse, MD  08/16/2023   This document may have been prepared by [facility] Voice Recognition software and as such may include unintentional dictation errors.

## 2023-08-23 DIAGNOSIS — H2511 Age-related nuclear cataract, right eye: Secondary | ICD-10-CM | POA: Diagnosis not present

## 2023-08-23 DIAGNOSIS — H401113 Primary open-angle glaucoma, right eye, severe stage: Secondary | ICD-10-CM | POA: Diagnosis not present

## 2023-08-23 DIAGNOSIS — H2589 Other age-related cataract: Secondary | ICD-10-CM | POA: Diagnosis not present

## 2023-09-19 ENCOUNTER — Encounter: Payer: Self-pay | Admitting: Podiatry

## 2023-09-19 ENCOUNTER — Ambulatory Visit (INDEPENDENT_AMBULATORY_CARE_PROVIDER_SITE_OTHER): Payer: Self-pay | Admitting: Podiatry

## 2023-09-19 VITALS — Ht 76.0 in | Wt 179.0 lb

## 2023-09-19 DIAGNOSIS — M79675 Pain in left toe(s): Secondary | ICD-10-CM

## 2023-09-19 DIAGNOSIS — M2011 Hallux valgus (acquired), right foot: Secondary | ICD-10-CM

## 2023-09-19 DIAGNOSIS — M79674 Pain in right toe(s): Secondary | ICD-10-CM | POA: Diagnosis not present

## 2023-09-19 DIAGNOSIS — M2012 Hallux valgus (acquired), left foot: Secondary | ICD-10-CM

## 2023-09-19 DIAGNOSIS — B351 Tinea unguium: Secondary | ICD-10-CM | POA: Diagnosis not present

## 2023-09-22 NOTE — Progress Notes (Signed)
 Subjective: Ian Cook presents today referred by Sherron Monday, MD for complaint of with chief concern of diabetes with elongated, thickened, painful, discolored toenails for months. Aggravating factor(s) include wearing enclosed shoe gear. Patient has tried none.  Chief Complaint  Patient presents with   Nail Problem    Pt is here for [facility] PCP is Dr Evalee Mutton and LOV was in March.   Sherron Monday, MD is his PCP.  Past Medical History:  Diagnosis Date   Anxiety    Glaucoma    HTN (hypertension) 11/01/2014   Hypertension    Prostate cancer (HCC)    Schizo-affective schizophrenia (HCC)    Schizoaffective disorder, manic type (HCC) 10/30/2014   Tobacco use disorder 11/01/2014     Patient Active Problem List   Diagnosis Date Noted   Mixed hyperlipidemia 01/17/2023   Hyperthyroidism 01/03/2023   Prostate cancer (HCC) 08/02/2022   HTN (hypertension) 11/01/2014   Tobacco use disorder 11/01/2014   Schizoaffective disorder, manic type (HCC) 10/30/2014     Past Surgical History:  Procedure Laterality Date   PELVIC LYMPH NODE DISSECTION Bilateral 08/02/2022   Procedure: PELVIC LYMPH NODE DISSECTION;  Surgeon: Vanna Scotland, MD;  Location: [facility];  Service: Urology;  Laterality: Bilateral;   ROBOT ASSISTED LAPAROSCOPIC RADICAL PROSTATECTOMY N/A 08/02/2022   Procedure: XI ROBOTIC ASSISTED LAPAROSCOPIC RADICAL PROSTATECTOMY;  Surgeon: Vanna Scotland, MD;  Location: [facility];  Service: Urology;  Laterality: N/A;   TONSILLECTOMY       Current Outpatient Medications on File Prior to Visit  Medication Sig Dispense Refill   AMBULATORY NON FORMULARY MEDICATION ADULT PULL UPS-due to urinary leakage 120 each 12   amLODipine-benazepril (LOTREL) 10-40 MG capsule Take 1 capsule by mouth daily. 30 capsule 2   atorvastatin (LIPITOR) 10 MG tablet Take 1 tablet (10 mg total) by mouth every evening. 30 tablet 2   brimonidine (ALPHAGAN) 0.2 % ophthalmic solution Place 1 drop into the  right eye daily.     dorzolamide-timolol (COSOPT) 2-0.5 % ophthalmic solution Place 1 drop into the right eye 2 (two) times daily.     latanoprost (XALATAN) 0.005 % ophthalmic solution Place 1 drop into the right eye at bedtime.     methimazole (TAPAZOLE) 5 MG tablet Take 2.5 mg by mouth every morning.     OLANZapine (ZYPREXA) 2.5 MG tablet Take 1 tablet (2.5 mg total) by mouth every morning. 90 tablet 0   OLANZapine (ZYPREXA) 5 MG tablet Take 1 tablet (5 mg total) by mouth at bedtime. 90 tablet 0   ROCKLATAN 0.02-0.005 % SOLN Place 1 drop into both eyes at bedtime.     traZODone (DESYREL) 100 MG tablet Take 1 tablet (100 mg total) by mouth at bedtime. 90 tablet 0   No current facility-administered medications on file prior to visit.     No Known Allergies   Social History   Occupational History   Not on file  Tobacco Use   Smoking status: Every Day    Current packs/day: 0.75    Average packs/day: 0.8 packs/day for 39.0 years (29.3 ttl pk-yrs)    Types: Cigarettes    Passive exposure: Current   Smokeless tobacco: Never  Vaping Use   Vaping status: Never Used  Substance and Sexual Activity   Alcohol use: No    Alcohol/week: 0.0 standard drinks of alcohol   Drug use: No   Sexual activity: Never     Family History  Problem Relation Age of Onset   Aneurysm Mother  Depression Mother    CAD Father    Kidney cancer Neg Hx    Prostate cancer Neg Hx      Immunization History  Administered Date(s) Administered   PPD Test 09/06/2022     Objective: There were no vitals filed for this visit.  Ian Cook is a pleasant 63 y.o. male WD, WN in NAD. AAO x 3.  Vascular Examination: Capillary refill time immediate b/l. Vascular status intact b/l with palpable pedal pulses. Pedal hair present b/l. No pain with calf compression b/l. Skin temperature gradient WNL b/l. No cyanosis or clubbing b/l. No ischemia or gangrene noted b/l. No edema noted b/l LE.  Neurological  Examination: Sensation grossly intact b/l with 10 gram monofilament. Vibratory sensation intact b/l.   Dermatological Examination: Pedal skin with normal turgor, texture and tone b/l.  No open wounds. No interdigital macerations.   Toenails 1-5 b/l thick, discolored, elongated with subungual debris and pain on dorsal palpation.   No hyperkeratotic nor porokeratotic lesions present on today's visit.  Musculoskeletal Examination: Muscle strength 5/5 to all lower extremity muscle groups bilaterally. HAV with bunion deformity noted b/l LE. Patient ambulates independent of any assistive aids.  Radiographs: None  Assessment: 1. Pain due to onychomycosis of toenails of both feet   2. Hallux valgus, acquired, bilateral     Plan: -Patient was evaluated today. All questions/concerns addressed on today's visit. -Patient to continue soft, supportive shoe gear daily. -Discussed treatment options for onychomycosis. Patient opted for toenail debridement only on today.  -Toenails 1-5 b/l were debrided in length and girth with sterile nail nippers and dremel without iatrogenic bleeding.  -Patient/POA to call should there be question/concern in the interim.  Return in about 3 months (around 12/19/2023).  Luella Sager, DPM      [facility] LOCATION: [address], [place] [address]                   Office [phone]   [facility] LOCATION: [address] [place], [place] [address] Office [phone]

## 2023-09-29 DIAGNOSIS — E042 Nontoxic multinodular goiter: Secondary | ICD-10-CM | POA: Diagnosis not present

## 2023-09-29 DIAGNOSIS — E0590 Thyrotoxicosis, unspecified without thyrotoxic crisis or storm: Secondary | ICD-10-CM | POA: Diagnosis not present

## 2023-10-03 ENCOUNTER — Other Ambulatory Visit

## 2023-10-03 DIAGNOSIS — N289 Disorder of kidney and ureter, unspecified: Secondary | ICD-10-CM | POA: Diagnosis not present

## 2023-10-04 ENCOUNTER — Ambulatory Visit (INDEPENDENT_AMBULATORY_CARE_PROVIDER_SITE_OTHER): Admitting: Internal Medicine

## 2023-10-04 ENCOUNTER — Encounter: Payer: Self-pay | Admitting: Internal Medicine

## 2023-10-04 VITALS — BP 120/85 | HR 63 | Temp 96.3°F | Ht 76.0 in | Wt 179.0 lb

## 2023-10-04 DIAGNOSIS — E782 Mixed hyperlipidemia: Secondary | ICD-10-CM

## 2023-10-04 DIAGNOSIS — Z0130 Encounter for examination of blood pressure without abnormal findings: Secondary | ICD-10-CM

## 2023-10-04 DIAGNOSIS — N289 Disorder of kidney and ureter, unspecified: Secondary | ICD-10-CM | POA: Diagnosis not present

## 2023-10-04 DIAGNOSIS — E870 Hyperosmolality and hypernatremia: Secondary | ICD-10-CM | POA: Diagnosis not present

## 2023-10-04 LAB — BMP8+ANION GAP
Anion Gap: 14 mmol/L (ref 10.0–18.0)
BUN/Creatinine Ratio: 13 (ref 10–24)
BUN: 17 mg/dL (ref 8–27)
CO2: 23 mmol/L (ref 20–29)
Calcium: 9.9 mg/dL (ref 8.6–10.2)
Chloride: 108 mmol/L — ABNORMAL HIGH (ref 96–106)
Creatinine, Ser: 1.26 mg/dL (ref 0.76–1.27)
Glucose: 89 mg/dL (ref 70–99)
Potassium: 4.3 mmol/L (ref 3.5–5.2)
Sodium: 145 mmol/L — ABNORMAL HIGH (ref 134–144)
eGFR: 64 mL/min/{1.73_m2} (ref >59)

## 2023-10-04 NOTE — Progress Notes (Signed)
 Established Patient Office Visit  Subjective:  Patient ID: Ian Cook, male    DOB: [birth year]  Age: 63 y.o. MRN: [id number]  Chief Complaint  Patient presents with   Follow-up    6 week lab results    No new complaints, here for lab review and medication refills. BMP notable for normalization of Cr and gfr but sodium elevated.     No other concerns at this time.   Past Medical History:  Diagnosis Date   Anxiety    Glaucoma    HTN (hypertension) 11/01/2014   Hypertension    Prostate cancer (HCC)    Schizo-affective schizophrenia (HCC)    Schizoaffective disorder, manic type (HCC) 10/30/2014   Tobacco use disorder 11/01/2014    Past Surgical History:  Procedure Laterality Date   PELVIC LYMPH NODE DISSECTION Bilateral 08/02/2022   Procedure: PELVIC LYMPH NODE DISSECTION;  Surgeon: Dustin Gimenez, MD;  Location: [facility];  Service: Urology;  Laterality: Bilateral;   ROBOT ASSISTED LAPAROSCOPIC RADICAL PROSTATECTOMY N/A 08/02/2022   Procedure: XI ROBOTIC ASSISTED LAPAROSCOPIC RADICAL PROSTATECTOMY;  Surgeon: Dustin Gimenez, MD;  Location: [facility] OR[facility];  Service: Urology;  Laterality: N/A;   TONSILLECTOMY      Social History   Socioeconomic History   Marital status: Married    Spouse name: Not on file   Number of children: Not on file   Years of education: Not on file   Highest education level: Not on file  Occupational History   Not on file  Tobacco Use   Smoking status: Every Day    Current packs/day: 0.75    Average packs/day: 0.8 packs/day for 39.0 years (29.3 ttl pk-yrs)    Types: Cigarettes    Passive exposure: Current   Smokeless tobacco: Never  Vaping Use   Vaping status: Never Used  Substance and Sexual Activity   Alcohol use: No    Alcohol/week: 0.0 standard drinks of alcohol   Drug use: No   Sexual activity: Never  Other Topics Concern   Not on file  Social History Narrative   From Family care home.   Social Drivers of Research scientist (physical sciences) Strain: Not on file  Food Insecurity: Not on file  Transportation Needs: Not on file  Physical Activity: Not on file  Stress: Not on file  Social Connections: Not on file  Intimate Partner Violence: Not on file    Family History  Problem Relation Age of Onset   Aneurysm Mother    Depression Mother    CAD Father    Kidney cancer Neg Hx    Prostate cancer Neg Hx     No Known Allergies  Outpatient Medications Prior to Visit  Medication Sig   AMBULATORY NON FORMULARY MEDICATION ADULT PULL UPS-due to urinary leakage   amLODipine -benazepril  (LOTREL) 10-40 MG capsule Take 1 capsule by mouth daily.   atorvastatin  (LIPITOR) 10 MG tablet Take 1 tablet (10 mg total) by mouth every evening.   brimonidine  (ALPHAGAN ) 0.2 % ophthalmic solution Place 1 drop into the right eye daily.   dorzolamide -timolol  (COSOPT ) 2-0.5 % ophthalmic solution Place 1 drop into the right eye 2 (two) times daily.   latanoprost  (XALATAN ) 0.005 % ophthalmic solution Place 1 drop into the right eye at bedtime.   methimazole  (TAPAZOLE ) 5 MG tablet Take 2.5 mg by mouth every morning.   OLANZapine  (ZYPREXA ) 2.5 MG tablet Take 1 tablet (2.5 mg total) by mouth every morning.   OLANZapine  (ZYPREXA ) 5 MG tablet Take 1 tablet (  5 mg total) by mouth at bedtime.   ROCKLATAN 0.02-0.005 % SOLN Place 1 drop into both eyes at bedtime.   traZODone  (DESYREL ) 100 MG tablet Take 1 tablet (100 mg total) by mouth at bedtime.   No facility-administered medications prior to visit.    Review of Systems  Constitutional: Negative.   HENT: Negative.    Eyes: Negative.   Respiratory: Negative.    Cardiovascular: Negative.   Gastrointestinal: Negative.   Genitourinary: Negative.   Skin: Negative.   Neurological: Negative.   Endo/Heme/Allergies: Negative.        Objective:   BP 120/85   Pulse 63   Temp (!) 96.3 F (35.7 C)   Ht 6\' 4"  (1.93 m)   Wt 179 lb (81.2 kg)   SpO2 95%   BMI 21.79 kg/m   Vitals:   10/04/23  1037  BP: 120/85  Pulse: 63  Temp: (!) 96.3 F (35.7 C)  Height: 6\' 4"  (1.93 m)  Weight: 179 lb (81.2 kg)  SpO2: 95%  BMI (Calculated): 21.8    Physical Exam Vitals reviewed.  Constitutional:      Appearance: Normal appearance.  HENT:     Head: Normocephalic.     Left Ear: Ear canal normal. There is no impacted cerumen.     Nose: Nose normal.     Mouth/Throat:     Mouth: Mucous membranes are moist.     Pharynx: No posterior oropharyngeal erythema.  Eyes:     Extraocular Movements: Extraocular movements intact.     Pupils: Pupils are equal, round, and reactive to light.  Cardiovascular:     Rate and Rhythm: Regular rhythm.     Chest Wall: PMI is not displaced.     Pulses: Normal pulses.     Heart sounds: Normal heart sounds. No murmur heard. Pulmonary:     Effort: Pulmonary effort is normal.     Breath sounds: Normal air entry. No rhonchi or rales.  Abdominal:     General: Abdomen is flat. Bowel sounds are normal. There is no distension.     Palpations: Abdomen is soft. There is no hepatomegaly, splenomegaly or mass.     Tenderness: There is no abdominal tenderness.  Musculoskeletal:        General: Normal range of motion.     Cervical back: Normal range of motion and neck supple.     Right lower leg: No edema.     Left lower leg: No edema.  Skin:    General: Skin is warm and dry.  Neurological:     General: No focal deficit present.     Mental Status: He is alert and oriented to person, place, and time.     Cranial Nerves: No cranial nerve deficit.     Motor: No weakness.  Psychiatric:        Mood and Affect: Mood normal.        Behavior: Behavior normal.      No results found for any visits on 10/04/23.  Recent Results (from the past 2160 hours)  CBC With Diff/Platelet     Status: None   Collection Time: 08/11/23  3:26 PM  Result Value Ref Range   WBC 6.8 3.4 - 10.8 x10E3/uL   RBC 4.56 4.14 - 5.80 x10E6/uL   Hemoglobin 14.2 13.0 - 17.7 g/dL    Hematocrit 18.8 41.6 - 51.0 %   MCV 95 79 - 97 fL   MCH 31.1 26.6 - 33.0 pg   MCHC 32.8  31.5 - 35.7 g/dL   RDW 69.6 29.5 - 28.4 %   Platelets 238 150 - 450 x10E3/uL   Neutrophils 51 Not Estab. %   Lymphs 38 Not Estab. %   Monocytes 7 Not Estab. %   Eos 4 Not Estab. %   Basos 0 Not Estab. %   Neutrophils Absolute 3.4 1.4 - 7.0 x10E3/uL   Lymphocytes Absolute 2.6 0.7 - 3.1 x10E3/uL   Monocytes Absolute 0.5 0.1 - 0.9 x10E3/uL   EOS (ABSOLUTE) 0.2 0.0 - 0.4 x10E3/uL   Basophils Absolute 0.0 0.0 - 0.2 x10E3/uL   Immature Granulocytes 0 Not Estab. %   Immature Grans (Abs) 0.0 0.0 - 0.1 x10E3/uL  Comprehensive metabolic panel     Status: Abnormal   Collection Time: 08/11/23  3:26 PM  Result Value Ref Range   Glucose 108 (H) 70 - 99 mg/dL   BUN 19 8 - 27 mg/dL   Creatinine, Ser 1.32 (H) 0.76 - 1.27 mg/dL   eGFR 53 (L) >44 WN/UUV/2.53   BUN/Creatinine Ratio 13 10 - 24   Sodium 145 (H) 134 - 144 mmol/L   Potassium 4.5 3.5 - 5.2 mmol/L   Chloride 105 96 - 106 mmol/L   CO2 23 20 - 29 mmol/L   Calcium  9.8 8.6 - 10.2 mg/dL   Total Protein 7.0 6.0 - 8.5 g/dL   Albumin 4.3 3.9 - 4.9 g/dL   Globulin, Total 2.7 1.5 - 4.5 g/dL   Bilirubin Total 0.2 0.0 - 1.2 mg/dL   Alkaline Phosphatase 81 44 - 121 IU/L   AST 28 0 - 40 IU/L   ALT 27 0 - 44 IU/L  Lipid panel     Status: Abnormal   Collection Time: 08/11/23  3:26 PM  Result Value Ref Range   Cholesterol, Total 126 100 - 199 mg/dL   Triglycerides 664 (H) 0 - 149 mg/dL   HDL 39 (L) >40 mg/dL   VLDL Cholesterol Cal 28 5 - 40 mg/dL   LDL Chol Calc (NIH) 59 0 - 99 mg/dL   Chol/HDL Ratio 3.2 0.0 - 5.0 ratio    Comment:                                   T. Chol/HDL Ratio                                             Men  Women                               1/2 Avg.Risk  3.4    3.3                                   Avg.Risk  5.0    4.4                                2X Avg.Risk  9.6    7.1                                3X Avg.Risk  23.4   11.0    TSH     Status: None   Collection Time: 08/11/23  3:26 PM  Result Value Ref Range   TSH 1.100 0.450 - 4.500 uIU/mL  CK     Status: None   Collection Time: 08/11/23  3:26 PM  Result Value Ref Range   Total CK 314 41 - 331 U/L  POCT Urinalysis Dipstick (81002)     Status: Abnormal   Collection Time: 08/16/23 12:24 PM  Result Value Ref Range   Color, UA     Clarity, UA     Glucose, UA Negative Negative   Bilirubin, UA Negative    Ketones, UA Negative    Spec Grav, UA 1.015 1.010 - 1.025   Blood, UA 1+    pH, UA 6.0 5.0 - 8.0   Protein, UA Negative Negative   Urobilinogen, UA 0.2 0.2 or 1.0 E.U./dL   Nitrite, UA Negative    Leukocytes, UA Negative Negative   Appearance     Odor    POC CREATINE & ALBUMIN,URINE     Status: Normal   Collection Time: 08/16/23 12:25 PM  Result Value Ref Range   Microalbumin Ur, POC 10 mg/L   Creatinine, POC 50 mg/dL   Albumin/Creatinine Ratio, Urine, POC <30   BMP8+Anion Gap     Status: Abnormal   Collection Time: 10/03/23  8:23 AM  Result Value Ref Range   Glucose 89 70 - 99 mg/dL   BUN 17 8 - 27 mg/dL   Creatinine, Ser 1.61 0.76 - 1.27 mg/dL   eGFR 64 >09 UE/AVW/0.98   BUN/Creatinine Ratio 13 10 - 24   Sodium 145 (H) 134 - 144 mmol/L   Potassium 4.3 3.5 - 5.2 mmol/L   Chloride 108 (H) 96 - 106 mmol/L   CO2 23 20 - 29 mmol/L   Anion Gap 14.0 10.0 - 18.0 mmol/L   Calcium  9.9 8.6 - 10.2 mg/dL      Assessment & Plan:  As per problem list, instructed to drink at least 1.5 L of water /day. Problem List Items Addressed This Visit       Other   Mixed hyperlipidemia   Relevant Orders   Lipid panel   CK   Other Visit Diagnoses       Hypernatremia    -  Primary   Relevant Orders   Comprehensive metabolic panel with GFR     Renal dysfunction       Relevant Orders   Comprehensive metabolic panel with GFR       Return in about 6 weeks (around 11/15/2023) for fu with labs prior.   Total time spent: 20 minutes  Arzella Bitters, MD  10/04/2023   This document may have been prepared by [facility] Voice Recognition software and as such may include unintentional dictation errors.

## 2023-10-19 DIAGNOSIS — E042 Nontoxic multinodular goiter: Secondary | ICD-10-CM | POA: Diagnosis not present

## 2023-10-19 DIAGNOSIS — E0590 Thyrotoxicosis, unspecified without thyrotoxic crisis or storm: Secondary | ICD-10-CM | POA: Diagnosis not present

## 2023-11-01 ENCOUNTER — Emergency Department
Admission: EM | Admit: 2023-11-01 | Discharge: 2023-11-01 | Disposition: A | Discharge status: Home / Self Care | Attending: Emergency Medicine | Admitting: Emergency Medicine

## 2023-11-01 ENCOUNTER — Other Ambulatory Visit: Payer: Self-pay

## 2023-11-01 ENCOUNTER — Encounter: Payer: Self-pay | Admitting: Emergency Medicine

## 2023-11-01 DIAGNOSIS — R4183 Borderline intellectual functioning: Secondary | ICD-10-CM

## 2023-11-01 DIAGNOSIS — Z760 Encounter for issue of repeat prescription: Secondary | ICD-10-CM | POA: Diagnosis not present

## 2023-11-01 DIAGNOSIS — F250 Schizoaffective disorder, bipolar type: Secondary | ICD-10-CM

## 2023-11-01 MED ORDER — BENZTROPINE MESYLATE 1 MG PO TABS: 0.5000 mg | ORAL_TABLET | Freq: Every day | ORAL | 0 refills | Status: AC

## 2023-11-01 MED ORDER — METHIMAZOLE 5 MG PO TABS: 5.0000 mg | ORAL_TABLET | ORAL | 0 refills | Status: AC

## 2023-11-01 MED ORDER — OLANZAPINE 5 MG PO TABS
2.5000 mg | ORAL_TABLET | Freq: Once | ORAL | Status: AC
  Administered 2023-11-01: 2.5 mg via ORAL
  Filled 2023-11-01: qty 1

## 2023-11-01 MED ORDER — BENZTROPINE MESYLATE 1 MG PO TABS
0.5000 mg | ORAL_TABLET | Freq: Once | ORAL | Status: AC
  Administered 2023-11-01: 0.5 mg via ORAL
  Filled 2023-11-01: qty 1

## 2023-11-01 MED ORDER — OLANZAPINE 5 MG PO TABS: 5.0000 mg | ORAL_TABLET | Freq: Every day | ORAL | 0 refills | Status: AC

## 2023-11-01 MED ORDER — OLANZAPINE 2.5 MG PO TABS: 2.5000 mg | ORAL_TABLET | Freq: Every morning | ORAL | 0 refills | Status: AC

## 2023-11-01 MED ORDER — METHIMAZOLE 5 MG PO TABS
5.0000 mg | ORAL_TABLET | Freq: Once | ORAL | Status: AC
  Administered 2023-11-01: 5 mg via ORAL
  Filled 2023-11-01: qty 1

## 2023-11-01 MED ORDER — TRAZODONE HCL 100 MG PO TABS: 100.0000 mg | ORAL_TABLET | Freq: Every day | ORAL | 0 refills | Status: AC

## 2023-11-01 NOTE — ED Provider Notes (Signed)
 [facility] Provider Note    Event Date/Time   First MD Initiated Contact with Patient 11/01/23 1231     (approximate)   History   Medication Refill   HPI Ian Cook is a 63 y.o. male  presenting for medication refill. Patient presents from group home needing refills of his psychiatric medications as the previous provider is no longer working with the group home. They do have a psychiatrist coming to see him in a couple weeks to restart medications but they need refills until then. No other acute findings.      Physical Exam   Triage Vital Signs: ED Triage Vitals  Encounter Vitals Group     BP 11/01/23 1223 123/85     Systolic BP Percentile --      Diastolic BP Percentile --      Pulse Rate 11/01/23 1223 (!) 108     Resp 11/01/23 1223 15     Temp 11/01/23 1223 98.2 F (36.8 C)     Temp Source 11/01/23 1223 Oral     SpO2 11/01/23 1223 100 %     Weight 11/01/23 1225 170 lb (77.1 kg)     Height 11/01/23 1225 6\' 4"  (1.93 m)     Head Circumference --      Peak Flow --      Pain Score 11/01/23 1223 0     Pain Loc --      Pain Education --      Exclude from Growth Chart --     Most recent vital signs: Vitals:   11/01/23 1223  BP: 123/85  Pulse: (!) 108  Resp: 15  Temp: 98.2 F (36.8 C)  SpO2: 100%   I have reviewed the vital signs. General:  Awake, alert, no acute distress. Head:  Normocephalic, Atraumatic. EENT:  PERRL, EOMI, Oral mucosa pink and moist, Neck is supple. Cardiovascular: Regular rate, 2+ distal pulses. Respiratory:  Normal respiratory effort, symmetrical expansion, no distress.   Extremities:  Moving all four extremities through full ROM without pain.   Neuro:  Alert and oriented.  Interacting appropriately.   Skin:  Warm, dry, no rash.   Psych: Appropriate affect.     ED Results / Procedures / Treatments   Labs (all labs ordered are listed, but only abnormal results are displayed) Labs Reviewed - No data to  display   EKG    RADIOLOGY    PROCEDURES:  Critical Care performed: No  Procedures   MEDICATIONS ORDERED IN ED: Medications  methimazole  (TAPAZOLE ) tablet 5 mg (has no administration in time range)  benztropine  (COGENTIN ) tablet 0.5 mg (has no administration in time range)  OLANZapine  (ZYPREXA ) tablet 2.5 mg (has no administration in time range)     IMPRESSION / MDM / ASSESSMENT AND PLAN / ED COURSE  I reviewed the triage vital signs and the nursing notes.                              Differential diagnosis includes, but is not limited to, medication refill  Patient's presentation is most consistent with acute, uncomplicated illness.  Patient is a 63 year old male presenting with his group home staff members for medication refill. No other needs today. I have refilled his quetiapine, benztropine , risperidone, and gabapentin after reviewing the [facility] they brought with them.  He also needed his morning meds today as those were missed.  Safe for discharge.  FINAL CLINICAL IMPRESSION(S) / ED DIAGNOSES   Final diagnoses:  Medication refill     Rx / DC Orders   ED Discharge Orders          Ordered    methimazole  (TAPAZOLE ) 5 MG tablet  BH-each morning        11/01/23 1257    benztropine  (COGENTIN ) 1 MG tablet  Daily        11/01/23 1257    OLANZapine  (ZYPREXA ) 2.5 MG tablet  Every morning        11/01/23 1257    OLANZapine  (ZYPREXA ) 5 MG tablet  Daily at bedtime        11/01/23 1257    traZODone  (DESYREL ) 100 MG tablet  Daily at bedtime        11/01/23 1257             Note:  This document was prepared using Dragon voice recognition software and may include unintentional dictation errors.   Kandee Orion, MD 11/01/23 [phone]

## 2023-11-01 NOTE — ED Triage Notes (Addendum)
 Pt arrives from a group home ([facility]) for a medication refills (see below). Pt denies any complaints at this time. Pt is ambulatory in triage.   Pt hasn't had the following meds today: Methimazole  5mg  Benztropine  0.5mg  Olanzapine  2.5mg  am 5mg  PM Trazodone   100mg  (nightly)

## 2023-11-05 DIAGNOSIS — E042 Nontoxic multinodular goiter: Secondary | ICD-10-CM | POA: Diagnosis not present

## 2023-11-09 ENCOUNTER — Other Ambulatory Visit: Payer: Self-pay

## 2023-11-09 ENCOUNTER — Ambulatory Visit: Payer: 59 | Admitting: Urology

## 2023-11-18 DIAGNOSIS — H2511 Age-related nuclear cataract, right eye: Secondary | ICD-10-CM | POA: Diagnosis not present

## 2023-11-18 DIAGNOSIS — H401113 Primary open-angle glaucoma, right eye, severe stage: Secondary | ICD-10-CM | POA: Diagnosis not present

## 2023-11-18 DIAGNOSIS — H2589 Other age-related cataract: Secondary | ICD-10-CM | POA: Diagnosis not present

## 2023-11-21 ENCOUNTER — Encounter: Payer: Self-pay | Admitting: Internal Medicine

## 2023-11-21 ENCOUNTER — Ambulatory Visit (INDEPENDENT_AMBULATORY_CARE_PROVIDER_SITE_OTHER): Admitting: Internal Medicine

## 2023-11-21 VITALS — BP 102/70 | HR 83 | Temp 96.6°F | Ht 76.0 in | Wt 178.4 lb

## 2023-11-21 DIAGNOSIS — E782 Mixed hyperlipidemia: Secondary | ICD-10-CM

## 2023-11-21 DIAGNOSIS — Z0130 Encounter for examination of blood pressure without abnormal findings: Secondary | ICD-10-CM

## 2023-11-21 DIAGNOSIS — E0590 Thyrotoxicosis, unspecified without thyrotoxic crisis or storm: Secondary | ICD-10-CM

## 2023-11-21 MED ORDER — ATORVASTATIN CALCIUM 10 MG PO TABS: 10.0000 mg | ORAL_TABLET | Freq: Every evening | ORAL | 3 refills | Status: AC

## 2023-11-21 NOTE — Progress Notes (Signed)
 Established Patient Office Visit  Subjective:  Patient ID: Ian Cook, male    DOB: 1960/11/15  Age: 63 y.o. MRN: [id number]  Chief Complaint  Patient presents with   Follow-up    6 weeks lab results    No new complaints, here for lab review and medication refills. Failed to have previsit labs done.     No other concerns at this time.   Past Medical History:  Diagnosis Date   Anxiety    Glaucoma    HTN (hypertension) 11/01/2014   Hypertension    Prostate cancer (HCC)    Schizo-affective schizophrenia (HCC)    Schizoaffective disorder, manic type (HCC) 10/30/2014   Tobacco use disorder 11/01/2014    Past Surgical History:  Procedure Laterality Date   PELVIC LYMPH NODE DISSECTION Bilateral 08/02/2022   Procedure: PELVIC LYMPH NODE DISSECTION;  Surgeon: Dustin Gimenez, MD;  Location: [facility] ORS;  Service: Urology;  Laterality: Bilateral;   ROBOT ASSISTED LAPAROSCOPIC RADICAL PROSTATECTOMY N/A 08/02/2022   Procedure: XI ROBOTIC ASSISTED LAPAROSCOPIC RADICAL PROSTATECTOMY;  Surgeon: Dustin Gimenez, MD;  Location: [facility] ORS;  Service: Urology;  Laterality: N/A;   TONSILLECTOMY      Social History   Socioeconomic History   Marital status: Married    Spouse name: Not on file   Number of children: Not on file   Years of education: Not on file   Highest education level: Not on file  Occupational History   Not on file  Tobacco Use   Smoking status: Every Day    Current packs/day: 0.75    Average packs/day: 0.8 packs/day for 39.0 years (29.3 ttl pk-yrs)    Types: Cigarettes    Passive exposure: Current   Smokeless tobacco: Never  Vaping Use   Vaping status: Never Used  Substance and Sexual Activity   Alcohol use: No    Alcohol/week: 0.0 standard drinks of alcohol   Drug use: No   Sexual activity: Never  Other Topics Concern   Not on file  Social History Narrative   From Family care home.   Social Drivers of Corporate investment banker Strain: Not on file   Food Insecurity: Not on file  Transportation Needs: Not on file  Physical Activity: Not on file  Stress: Not on file  Social Connections: Not on file  Intimate Partner Violence: Not on file    Family History  Problem Relation Age of Onset   Aneurysm Mother    Depression Mother    CAD Father    Kidney cancer Neg Hx    Prostate cancer Neg Hx     No Known Allergies  Outpatient Medications Prior to Visit  Medication Sig   AMBULATORY NON FORMULARY MEDICATION ADULT PULL UPS-due to urinary leakage   amLODipine -benazepril  (LOTREL) 10-40 MG capsule Take 1 capsule by mouth daily.   benztropine  (COGENTIN ) 1 MG tablet Take 0.5 tablets (0.5 mg total) by mouth daily.   brimonidine  (ALPHAGAN ) 0.2 % ophthalmic solution Place 1 drop into the right eye daily.   dorzolamide -timolol  (COSOPT ) 2-0.5 % ophthalmic solution Place 1 drop into the right eye 2 (two) times daily.   latanoprost  (XALATAN ) 0.005 % ophthalmic solution Place 1 drop into the right eye at bedtime.   methimazole  (TAPAZOLE ) 5 MG tablet Take 1 tablet (5 mg total) by mouth every morning.   OLANZapine  (ZYPREXA ) 2.5 MG tablet Take 1 tablet (2.5 mg total) by mouth every morning.   OLANZapine  (ZYPREXA ) 5 MG tablet Take 1 tablet (5 mg total)  by mouth at bedtime.   ROCKLATAN 0.02-0.005 % SOLN Place 1 drop into both eyes at bedtime.   traZODone  (DESYREL ) 100 MG tablet Take 1 tablet (100 mg total) by mouth at bedtime.   [DISCONTINUED] atorvastatin  (LIPITOR) 10 MG tablet Take 1 tablet (10 mg total) by mouth every evening.   No facility-administered medications prior to visit.    Review of Systems  Constitutional: Negative.  Negative for weight loss (gained 8 lbs).  HENT: Negative.    Eyes: Negative.   Respiratory: Negative.    Cardiovascular: Negative.   Gastrointestinal: Negative.   Genitourinary: Negative.   Skin: Negative.   Neurological: Negative.   Endo/Heme/Allergies: Negative.        Objective:   BP 102/70   Pulse 83    Temp (!) 96.6 F (35.9 C)   Ht 6' 4 (1.93 m)   Wt 178 lb 6.4 oz (80.9 kg)   SpO2 97%   BMI 21.72 kg/m   Vitals:   11/21/23 1335  BP: 102/70  Pulse: 83  Temp: (!) 96.6 F (35.9 C)  Height: 6' 4 (1.93 m)  Weight: 178 lb 6.4 oz (80.9 kg)  SpO2: 97%  BMI (Calculated): 21.72    Physical Exam Vitals reviewed.  Constitutional:      Appearance: Normal appearance.  HENT:     Head: Normocephalic.     Left Ear: Ear canal normal. There is no impacted cerumen.     Nose: Nose normal.     Mouth/Throat:     Mouth: Mucous membranes are moist.     Pharynx: No posterior oropharyngeal erythema.   Eyes:     Extraocular Movements: Extraocular movements intact.     Pupils: Pupils are equal, round, and reactive to light.    Cardiovascular:     Rate and Rhythm: Regular rhythm.     Chest Wall: PMI is not displaced.     Pulses: Normal pulses.     Heart sounds: Normal heart sounds. No murmur heard. Pulmonary:     Effort: Pulmonary effort is normal.     Breath sounds: Normal air entry. No rhonchi or rales.  Abdominal:     General: Abdomen is flat. Bowel sounds are normal. There is no distension.     Palpations: Abdomen is soft. There is no hepatomegaly, splenomegaly or mass.     Tenderness: There is no abdominal tenderness.   Musculoskeletal:        General: Normal range of motion.     Cervical back: Normal range of motion and neck supple.     Right lower leg: No edema.     Left lower leg: No edema.   Skin:    General: Skin is warm and dry.   Neurological:     General: No focal deficit present.     Mental Status: He is alert and oriented to person, place, and time.     Cranial Nerves: No cranial nerve deficit.     Motor: No weakness.   Psychiatric:        Mood and Affect: Mood normal.        Behavior: Behavior normal.      No results found for any visits on 11/21/23.  Recent Results (from the past 2160 hours)  BMP8+Anion Gap     Status: Abnormal   Collection Time:  10/03/23  8:23 AM  Result Value Ref Range   Glucose 89 70 - 99 mg/dL   BUN 17 8 - 27 mg/dL   Creatinine, Ser  1.26 0.76 - 1.27 mg/dL   eGFR 64 >81 XB/JYN/8.29   BUN/Creatinine Ratio 13 10 - 24   Sodium 145 (H) 134 - 144 mmol/L   Potassium 4.3 3.5 - 5.2 mmol/L   Chloride 108 (H) 96 - 106 mmol/L   CO2 23 20 - 29 mmol/L   Anion Gap 14.0 10.0 - 18.0 mmol/L   Calcium  9.9 8.6 - 10.2 mg/dL      Assessment & Plan:  As per problem list  Problem List Items Addressed This Visit       Endocrine   Hyperthyroidism     Other   Mixed hyperlipidemia - Primary   Relevant Medications   atorvastatin  (LIPITOR) 10 MG tablet   Other Relevant Orders   Lipid panel   Comprehensive metabolic panel with GFR    Return in about 2 weeks (around 12/05/2023) for awv with labs prior.   Total time spent: 20 minutes  Arzella Bitters, MD  11/21/2023   This document may have been prepared by [facility] Voice Recognition software and as such may include unintentional dictation errors.

## 2023-11-23 ENCOUNTER — Other Ambulatory Visit: Payer: Self-pay | Admitting: Internal Medicine

## 2023-11-23 DIAGNOSIS — I10 Essential (primary) hypertension: Secondary | ICD-10-CM

## 2023-12-05 ENCOUNTER — Ambulatory Visit (INDEPENDENT_AMBULATORY_CARE_PROVIDER_SITE_OTHER): Admitting: Internal Medicine

## 2023-12-05 ENCOUNTER — Encounter: Payer: Self-pay | Admitting: Internal Medicine

## 2023-12-05 VITALS — BP 110/80 | HR 68 | Temp 96.8°F | Ht 76.0 in | Wt 171.2 lb

## 2023-12-05 DIAGNOSIS — E782 Mixed hyperlipidemia: Secondary | ICD-10-CM | POA: Diagnosis not present

## 2023-12-05 DIAGNOSIS — E0590 Thyrotoxicosis, unspecified without thyrotoxic crisis or storm: Secondary | ICD-10-CM | POA: Diagnosis not present

## 2023-12-05 DIAGNOSIS — I10 Essential (primary) hypertension: Secondary | ICD-10-CM | POA: Diagnosis not present

## 2023-12-05 NOTE — Progress Notes (Signed)
 Established Patient Office Visit  Subjective:  Patient ID: Ian Cook, male    DOB: 05-10-1961  Age: 63 y.o. MRN: [id number]  Chief Complaint  Patient presents with   Results    2 week lab resuls    No new complaints, here for lab review and medication refills. Failed to have previsit labs done again but group home caregiver is also absent again.      No other concerns at this time.   Past Medical History:  Diagnosis Date   Anxiety    Glaucoma    HTN (hypertension) 11/01/2014   Hypertension    Prostate cancer (HCC)    Schizo-affective schizophrenia (HCC)    Schizoaffective disorder, manic type (HCC) 10/30/2014   Tobacco use disorder 11/01/2014    Past Surgical History:  Procedure Laterality Date   PELVIC LYMPH NODE DISSECTION Bilateral 08/02/2022   Procedure: PELVIC LYMPH NODE DISSECTION;  Surgeon: Penne Knee, MD;  Location: [facility] ORS;  Service: Urology;  Laterality: Bilateral;   ROBOT ASSISTED LAPAROSCOPIC RADICAL PROSTATECTOMY N/A 08/02/2022   Procedure: XI ROBOTIC ASSISTED LAPAROSCOPIC RADICAL PROSTATECTOMY;  Surgeon: Penne Knee, MD;  Location: [facility] ORS;  Service: Urology;  Laterality: N/A;   TONSILLECTOMY      Social History   Socioeconomic History   Marital status: Married    Spouse name: Not on file   Number of children: Not on file   Years of education: Not on file   Highest education level: Not on file  Occupational History   Not on file  Tobacco Use   Smoking status: Every Day    Current packs/day: 0.75    Average packs/day: 0.8 packs/day for 39.0 years (29.3 ttl pk-yrs)    Types: Cigarettes    Passive exposure: Current   Smokeless tobacco: Never  Vaping Use   Vaping status: Never Used  Substance and Sexual Activity   Alcohol use: No    Alcohol/week: 0.0 standard drinks of alcohol   Drug use: No   Sexual activity: Never  Other Topics Concern   Not on file  Social History Narrative   From [facility] care home.   Social Drivers of  Corporate investment banker Strain: Not on file  Food Insecurity: Not on file  Transportation Needs: Not on file  Physical Activity: Not on file  Stress: Not on file  Social Connections: Not on file  Intimate Partner Violence: Not on file    Family History  Problem Relation Age of Onset   Aneurysm Mother    Depression Mother    CAD Father    Kidney cancer Neg Hx    Prostate cancer Neg Hx     No Known Allergies  Outpatient Medications Prior to Visit  Medication Sig   AMBULATORY NON FORMULARY MEDICATION ADULT PULL UPS-due to urinary leakage   amLODipine -benazepril  (LOTREL) 10-40 MG capsule TAKE 1 CAPSULE BY MOUTH ONCE DAILY   atorvastatin  (LIPITOR) 10 MG tablet Take 1 tablet (10 mg total) by mouth every evening.   benztropine  (COGENTIN ) 1 MG tablet Take 0.5 tablets (0.5 mg total) by mouth daily.   brimonidine  (ALPHAGAN ) 0.2 % ophthalmic solution Place 1 drop into the right eye daily.   dorzolamide -timolol  (COSOPT ) 2-0.5 % ophthalmic solution Place 1 drop into the right eye 2 (two) times daily.   latanoprost  (XALATAN ) 0.005 % ophthalmic solution Place 1 drop into the right eye at bedtime.   methimazole  (TAPAZOLE ) 5 MG tablet Take 1 tablet (5 mg total) by mouth every morning.  OLANZapine  (ZYPREXA ) 2.5 MG tablet Take 1 tablet (2.5 mg total) by mouth every morning.   OLANZapine  (ZYPREXA ) 5 MG tablet Take 1 tablet (5 mg total) by mouth at bedtime.   QUEtiapine (SEROQUEL) 50 MG tablet Take 50 mg by mouth at bedtime.   risperiDONE (RISPERDAL M-TABS) 3 MG disintegrating tablet Take 3 mg by mouth 2 (two) times daily.   ROCKLATAN 0.02-0.005 % SOLN Place 1 drop into both eyes at bedtime.   traZODone  (DESYREL ) 100 MG tablet Take 1 tablet (100 mg total) by mouth at bedtime.   gabapentin (NEURONTIN) 400 MG capsule Take 400 mg by mouth 3 (three) times daily.   No facility-administered medications prior to visit.    Review of Systems  Constitutional: Negative.  Negative for weight loss.   HENT: Negative.    Eyes: Negative.   Respiratory: Negative.    Cardiovascular: Negative.   Gastrointestinal: Negative.   Genitourinary: Negative.   Skin: Negative.   Neurological: Negative.   Endo/Heme/Allergies: Negative.        Objective:   BP 110/80   Pulse 68   Temp (!) 96.8 F (36 C)   Ht 6' 4 (1.93 m)   Wt 171 lb 3.2 oz (77.7 kg)   SpO2 98%   BMI 20.84 kg/m   Vitals:   12/05/23 1422  BP: 110/80  Pulse: 68  Temp: (!) 96.8 F (36 C)  Height: 6' 4 (1.93 m)  Weight: 171 lb 3.2 oz (77.7 kg)  SpO2: 98%  BMI (Calculated): 20.85    Physical Exam Vitals reviewed.  Constitutional:      Appearance: Normal appearance.  HENT:     Head: Normocephalic.     Left Ear: Ear canal normal. There is no impacted cerumen.     Nose: Nose normal.     Mouth/Throat:     Mouth: Mucous membranes are moist.     Pharynx: No posterior oropharyngeal erythema.   Eyes:     Extraocular Movements: Extraocular movements intact.     Pupils: Pupils are equal, round, and reactive to light.    Cardiovascular:     Rate and Rhythm: Regular rhythm.     Chest Wall: PMI is not displaced.     Pulses: Normal pulses.     Heart sounds: Normal heart sounds. No murmur heard. Pulmonary:     Effort: Pulmonary effort is normal.     Breath sounds: Normal air entry. No rhonchi or rales.  Abdominal:     General: Abdomen is flat. Bowel sounds are normal. There is no distension.     Palpations: Abdomen is soft. There is no hepatomegaly, splenomegaly or mass.     Tenderness: There is no abdominal tenderness.   Musculoskeletal:        General: Normal range of motion.     Cervical back: Normal range of motion and neck supple.     Right lower leg: No edema.     Left lower leg: No edema.   Skin:    General: Skin is warm and dry.   Neurological:     General: No focal deficit present.     Mental Status: He is alert and oriented to person, place, and time.     Cranial Nerves: No cranial nerve  deficit.     Motor: No weakness.   Psychiatric:        Mood and Affect: Mood normal.        Behavior: Behavior normal.      No results found  for any visits on 12/05/23.  Recent Results (from the past 2160 hours)  BMP8+Anion Gap     Status: Abnormal   Collection Time: 10/03/23  8:23 AM  Result Value Ref Range   Glucose 89 70 - 99 mg/dL   BUN 17 8 - 27 mg/dL   Creatinine, Ser 8.73 0.76 - 1.27 mg/dL   eGFR 64 >40 fO/fpw/8.26   BUN/Creatinine Ratio 13 10 - 24   Sodium 145 (H) 134 - 144 mmol/L   Potassium 4.3 3.5 - 5.2 mmol/L   Chloride 108 (H) 96 - 106 mmol/L   CO2 23 20 - 29 mmol/L   Anion Gap 14.0 10.0 - 18.0 mmol/L   Calcium  9.9 8.6 - 10.2 mg/dL      Assessment & Plan:  As per problem list.  Problem List Items Addressed This Visit       Cardiovascular and Mediastinum   HTN (hypertension)     Endocrine   Hyperthyroidism     Other   Mixed hyperlipidemia - Primary    Return in about 2 weeks (around 12/19/2023) for fu with labs prior.   Total time spent: 20 minutes  Sherrill Cinderella Perry, MD  12/05/2023   This document may have been prepared by [facility] Voice Recognition software and as such may include unintentional dictation errors.

## 2023-12-19 ENCOUNTER — Ambulatory Visit: Admitting: Internal Medicine

## 2023-12-22 ENCOUNTER — Ambulatory Visit (INDEPENDENT_AMBULATORY_CARE_PROVIDER_SITE_OTHER): Admitting: Podiatry

## 2023-12-22 DIAGNOSIS — Z91199 Patient's noncompliance with other medical treatment and regimen due to unspecified reason: Secondary | ICD-10-CM

## 2023-12-22 NOTE — Progress Notes (Signed)
 1. No-show for appointment

## 2024-01-13 DIAGNOSIS — R451 Restlessness and agitation: Secondary | ICD-10-CM | POA: Diagnosis not present

## 2024-01-13 DIAGNOSIS — R001 Bradycardia, unspecified: Secondary | ICD-10-CM | POA: Diagnosis not present

## 2024-01-23 ENCOUNTER — Encounter: Payer: Self-pay | Admitting: Urology

## 2024-01-30 DIAGNOSIS — Z79899 Other long term (current) drug therapy: Secondary | ICD-10-CM | POA: Diagnosis not present

## 2024-01-30 DIAGNOSIS — F17200 Nicotine dependence, unspecified, uncomplicated: Secondary | ICD-10-CM | POA: Diagnosis not present

## 2024-02-05 DIAGNOSIS — G4700 Insomnia, unspecified: Secondary | ICD-10-CM | POA: Diagnosis not present

## 2024-02-05 DIAGNOSIS — I10 Essential (primary) hypertension: Secondary | ICD-10-CM | POA: Diagnosis not present

## 2024-02-05 DIAGNOSIS — F17200 Nicotine dependence, unspecified, uncomplicated: Secondary | ICD-10-CM | POA: Diagnosis not present

## 2024-02-05 DIAGNOSIS — E785 Hyperlipidemia, unspecified: Secondary | ICD-10-CM | POA: Diagnosis not present

## 2024-02-20 DIAGNOSIS — H401113 Primary open-angle glaucoma, right eye, severe stage: Secondary | ICD-10-CM | POA: Diagnosis not present

## 2024-02-20 DIAGNOSIS — H2511 Age-related nuclear cataract, right eye: Secondary | ICD-10-CM | POA: Diagnosis not present

## 2024-02-20 DIAGNOSIS — H2589 Other age-related cataract: Secondary | ICD-10-CM | POA: Diagnosis not present

## 2024-03-01 DIAGNOSIS — E785 Hyperlipidemia, unspecified: Secondary | ICD-10-CM | POA: Diagnosis not present

## 2024-03-01 DIAGNOSIS — G4700 Insomnia, unspecified: Secondary | ICD-10-CM | POA: Diagnosis not present

## 2024-03-01 DIAGNOSIS — F17200 Nicotine dependence, unspecified, uncomplicated: Secondary | ICD-10-CM | POA: Diagnosis not present

## 2024-03-01 DIAGNOSIS — I10 Essential (primary) hypertension: Secondary | ICD-10-CM | POA: Diagnosis not present

## 2024-03-05 DIAGNOSIS — F17200 Nicotine dependence, unspecified, uncomplicated: Secondary | ICD-10-CM | POA: Diagnosis not present

## 2024-03-05 DIAGNOSIS — Z79899 Other long term (current) drug therapy: Secondary | ICD-10-CM | POA: Diagnosis not present

## 2024-03-06 DIAGNOSIS — E785 Hyperlipidemia, unspecified: Secondary | ICD-10-CM | POA: Diagnosis not present

## 2024-03-06 DIAGNOSIS — I10 Essential (primary) hypertension: Secondary | ICD-10-CM | POA: Diagnosis not present

## 2024-03-07 DIAGNOSIS — I10 Essential (primary) hypertension: Secondary | ICD-10-CM | POA: Diagnosis not present

## 2024-03-07 DIAGNOSIS — G629 Polyneuropathy, unspecified: Secondary | ICD-10-CM | POA: Diagnosis not present

## 2024-03-07 DIAGNOSIS — E782 Mixed hyperlipidemia: Secondary | ICD-10-CM | POA: Diagnosis not present

## 2024-03-07 DIAGNOSIS — G4700 Insomnia, unspecified: Secondary | ICD-10-CM | POA: Diagnosis not present

## 2024-03-07 DIAGNOSIS — F17200 Nicotine dependence, unspecified, uncomplicated: Secondary | ICD-10-CM | POA: Diagnosis not present

## 2024-03-07 DIAGNOSIS — E0590 Thyrotoxicosis, unspecified without thyrotoxic crisis or storm: Secondary | ICD-10-CM | POA: Diagnosis not present

## 2024-03-08 DIAGNOSIS — Z79899 Other long term (current) drug therapy: Secondary | ICD-10-CM | POA: Diagnosis not present

## 2024-03-08 DIAGNOSIS — F17200 Nicotine dependence, unspecified, uncomplicated: Secondary | ICD-10-CM | POA: Diagnosis not present

## 2024-03-25 DIAGNOSIS — I10 Essential (primary) hypertension: Secondary | ICD-10-CM | POA: Diagnosis not present

## 2024-03-25 DIAGNOSIS — G4700 Insomnia, unspecified: Secondary | ICD-10-CM | POA: Diagnosis not present

## 2024-03-25 DIAGNOSIS — E782 Mixed hyperlipidemia: Secondary | ICD-10-CM | POA: Diagnosis not present

## 2024-03-25 DIAGNOSIS — F17200 Nicotine dependence, unspecified, uncomplicated: Secondary | ICD-10-CM | POA: Diagnosis not present

## 2024-03-25 DIAGNOSIS — E0590 Thyrotoxicosis, unspecified without thyrotoxic crisis or storm: Secondary | ICD-10-CM | POA: Diagnosis not present

## 2024-05-15 ENCOUNTER — Ambulatory Visit: Payer: Self-pay | Admitting: Urology

## 2024-05-17 NOTE — Progress Notes (Unsigned)
° °  05/21/2024 2:46 PM   Ian Cook [birth year] [id number]  Reason for visit: Follow up prostate Ca   HPI: 64 y.o. male, initial follow up with me today, previously seen by Dr. Penne in Dec 2024  Initial visit with me today Accompanied by a caregiver from his home He is pleasant but unable to comment on his prior history.  He did not know that he had his prostate removed last year Today, he does not report any thing bothering him, no interval GU issues  Prior HPI: Hx of prostate Ca  - RALP + BPLND (Feb 2024)  - Path = pT3bN0 GS 3+4=7, +ECE, -sm  - c/b self-removal of cath on POD4  - PSAs undetectable to date    Physical Exam: BP 124/67   Pulse 81   Ht 6' 4 (1.93 m)   Wt 170 lb (77.1 kg)   BMI 20.69 kg/m    Constitutional:  Alert and oriented, No acute distress.  Laboratory Data: Component Ref Range & Units (hover) 1 yr ago (03/11/23) 1 yr ago (09/13/22) 2 yr ago (04/27/22) 2 yr ago (10/26/21)  Prostate Specific Ag, Serum <0.1 <0.1 CM 4.8 High  CM 5.6 High  CM    Pertinent Imaging: N/A    Assessment & Plan:    Prostate cancer [facility]) Assessment & Plan: Intermediate-risk localized prostate Ca  - RALP + BPLND (Feb 2024)  - Path = pT3bN0 GS 3+4=7, +ECE, -sm  - c/b self-removal of cath on POD4  - PSAs undetectable to date  - Due for surveillance PSA, will collect today - Patient otherwise seems to be doing well.  He does not report any issues for me to address - If PSA undetectable, will continue annual surveillance  Orders: -     PSA; Standing       Penne JONELLE Skye, MD  [facility] [address], [address] [place], [place] [address] [phone]

## 2024-05-17 NOTE — Assessment & Plan Note (Signed)
 Intermediate-risk localized prostate Ca  - RALP + BPLND (Feb 2024)  - Path = pT3bN0 GS 3+4=7, +ECE, -sm  - c/b self-removal of cath on POD4  - PSAs undetectable to date  - Due for surveillance PSA, will collect today - Patient otherwise seems to be doing well.  He does not report any issues for me to address - If PSA undetectable, will continue annual surveillance

## 2024-05-21 ENCOUNTER — Ambulatory Visit: Admitting: Urology

## 2024-05-21 VITALS — BP 124/67 | HR 81 | Ht 76.0 in | Wt 170.0 lb

## 2024-05-21 DIAGNOSIS — C61 Malignant neoplasm of prostate: Secondary | ICD-10-CM

## 2024-05-22 LAB — PSA: Prostate Specific Ag, Serum: <0.1 ng/mL (ref 0.0–4.0)

## 2025-05-22 ENCOUNTER — Ambulatory Visit: Admitting: Urology
